# Patient Record
Sex: Female | Born: 1945 | Race: White | Hispanic: No | Smoking: Never smoker
Health system: Southern US, Community
[De-identification: ages and names within clinical notes are randomized; demographics above are authoritative.]

## PROBLEM LIST (undated history)

## (undated) DIAGNOSIS — M069 Rheumatoid arthritis, unspecified: Secondary | ICD-10-CM

## (undated) DIAGNOSIS — E785 Hyperlipidemia, unspecified: Secondary | ICD-10-CM

## (undated) DIAGNOSIS — F419 Anxiety disorder, unspecified: Secondary | ICD-10-CM

## (undated) DIAGNOSIS — R0789 Other chest pain: Secondary | ICD-10-CM

## (undated) DIAGNOSIS — I10 Essential (primary) hypertension: Secondary | ICD-10-CM

## (undated) DIAGNOSIS — I071 Rheumatic tricuspid insufficiency: Secondary | ICD-10-CM

## (undated) DIAGNOSIS — I4891 Unspecified atrial fibrillation: Secondary | ICD-10-CM

## (undated) DIAGNOSIS — I482 Chronic atrial fibrillation, unspecified: Secondary | ICD-10-CM

## (undated) DIAGNOSIS — I5189 Other ill-defined heart diseases: Secondary | ICD-10-CM

## (undated) DIAGNOSIS — I350 Nonrheumatic aortic (valve) stenosis: Secondary | ICD-10-CM

## (undated) HISTORY — PX: PARTIAL HYSTERECTOMY: SHX80

## (undated) HISTORY — PX: ABDOMINAL HYSTERECTOMY: SHX81

## (undated) HISTORY — DX: Unspecified atrial fibrillation: I48.91

## (undated) HISTORY — DX: Rheumatic tricuspid insufficiency: I07.1

## (undated) HISTORY — DX: Other chest pain: R07.89

## (undated) HISTORY — PX: CHOLECYSTECTOMY: SHX55

## (undated) HISTORY — PX: CARDIAC CATHETERIZATION: SHX172

## (undated) HISTORY — PX: EYE SURGERY: SHX253

## (undated) HISTORY — PX: TOTAL KNEE ARTHROPLASTY: SHX125

## (undated) HISTORY — DX: Chronic atrial fibrillation, unspecified: I48.20

## (undated) HISTORY — DX: Hyperlipidemia, unspecified: E78.5

## (undated) HISTORY — PX: BUNIONECTOMY: SHX129

## (undated) HISTORY — DX: Essential (primary) hypertension: I10

## (undated) HISTORY — DX: Other ill-defined heart diseases: I51.89

## (undated) HISTORY — PX: JOINT REPLACEMENT: SHX530

## (undated) HISTORY — DX: Anxiety disorder, unspecified: F41.9

## (undated) HISTORY — DX: Nonrheumatic aortic (valve) stenosis: I35.0

## (undated) HISTORY — PX: CATARACT EXTRACTION: SUR2

---

## 1998-05-08 ENCOUNTER — Ambulatory Visit (HOSPITAL_COMMUNITY): Admission: RE | Admit: 1998-05-08 | Discharge: 1998-05-08 | Payer: Self-pay | Admitting: Internal Medicine

## 1999-05-28 ENCOUNTER — Encounter: Admission: RE | Admit: 1999-05-28 | Discharge: 1999-05-28 | Payer: Self-pay | Admitting: Internal Medicine

## 2000-05-28 ENCOUNTER — Encounter: Admission: RE | Admit: 2000-05-28 | Discharge: 2000-05-28 | Payer: Self-pay | Admitting: Internal Medicine

## 2000-05-28 ENCOUNTER — Encounter: Payer: Self-pay | Admitting: Internal Medicine

## 2001-04-23 ENCOUNTER — Encounter: Payer: Self-pay | Admitting: Internal Medicine

## 2001-04-23 ENCOUNTER — Encounter: Admission: RE | Admit: 2001-04-23 | Discharge: 2001-04-23 | Payer: Self-pay | Admitting: Internal Medicine

## 2001-06-02 ENCOUNTER — Encounter: Payer: Self-pay | Admitting: Internal Medicine

## 2001-06-02 ENCOUNTER — Encounter: Admission: RE | Admit: 2001-06-02 | Discharge: 2001-06-02 | Payer: Self-pay | Admitting: Internal Medicine

## 2002-06-03 ENCOUNTER — Encounter: Admission: RE | Admit: 2002-06-03 | Discharge: 2002-06-03 | Payer: Self-pay | Admitting: Internal Medicine

## 2002-06-03 ENCOUNTER — Encounter: Payer: Self-pay | Admitting: Internal Medicine

## 2003-01-12 ENCOUNTER — Encounter: Payer: Self-pay | Admitting: Internal Medicine

## 2003-01-12 ENCOUNTER — Encounter: Admission: RE | Admit: 2003-01-12 | Discharge: 2003-01-12 | Payer: Self-pay | Admitting: Internal Medicine

## 2003-06-01 ENCOUNTER — Ambulatory Visit (HOSPITAL_COMMUNITY): Admission: RE | Admit: 2003-06-01 | Discharge: 2003-06-01 | Payer: Self-pay | Admitting: *Deleted

## 2003-06-06 ENCOUNTER — Encounter: Payer: Self-pay | Admitting: Internal Medicine

## 2003-06-06 ENCOUNTER — Encounter: Admission: RE | Admit: 2003-06-06 | Discharge: 2003-06-06 | Payer: Self-pay | Admitting: Internal Medicine

## 2004-06-07 ENCOUNTER — Ambulatory Visit (HOSPITAL_COMMUNITY): Admission: RE | Admit: 2004-06-07 | Discharge: 2004-06-07 | Payer: Self-pay | Admitting: Internal Medicine

## 2004-11-21 ENCOUNTER — Encounter: Admission: RE | Admit: 2004-11-21 | Discharge: 2004-11-21 | Payer: Self-pay | Admitting: Internal Medicine

## 2005-06-10 ENCOUNTER — Ambulatory Visit (HOSPITAL_COMMUNITY): Admission: RE | Admit: 2005-06-10 | Discharge: 2005-06-10 | Payer: Self-pay | Admitting: Internal Medicine

## 2006-03-07 ENCOUNTER — Encounter: Admission: RE | Admit: 2006-03-07 | Discharge: 2006-03-07 | Payer: Self-pay | Admitting: Internal Medicine

## 2006-06-16 ENCOUNTER — Ambulatory Visit (HOSPITAL_COMMUNITY): Admission: RE | Admit: 2006-06-16 | Discharge: 2006-06-16 | Payer: Self-pay | Admitting: Internal Medicine

## 2007-06-19 ENCOUNTER — Encounter: Admission: RE | Admit: 2007-06-19 | Discharge: 2007-06-19 | Payer: Self-pay | Admitting: Internal Medicine

## 2008-04-05 ENCOUNTER — Inpatient Hospital Stay (HOSPITAL_COMMUNITY): Admission: RE | Admit: 2008-04-05 | Discharge: 2008-04-09 | Payer: Self-pay | Admitting: Orthopedic Surgery

## 2008-06-20 ENCOUNTER — Ambulatory Visit (HOSPITAL_COMMUNITY): Admission: RE | Admit: 2008-06-20 | Discharge: 2008-06-20 | Payer: Self-pay | Admitting: Internal Medicine

## 2009-04-26 ENCOUNTER — Inpatient Hospital Stay (HOSPITAL_BASED_OUTPATIENT_CLINIC_OR_DEPARTMENT_OTHER): Admission: RE | Admit: 2009-04-26 | Discharge: 2009-04-26 | Payer: Self-pay | Admitting: Cardiovascular Disease

## 2009-06-21 ENCOUNTER — Ambulatory Visit (HOSPITAL_COMMUNITY): Admission: RE | Admit: 2009-06-21 | Discharge: 2009-06-21 | Payer: Self-pay | Admitting: Internal Medicine

## 2010-03-02 ENCOUNTER — Inpatient Hospital Stay (HOSPITAL_COMMUNITY): Admission: RE | Admit: 2010-03-02 | Discharge: 2010-03-05 | Payer: Self-pay | Admitting: Orthopedic Surgery

## 2010-03-26 ENCOUNTER — Ambulatory Visit: Payer: Self-pay | Admitting: Cardiovascular Disease

## 2010-04-23 ENCOUNTER — Ambulatory Visit: Payer: Self-pay | Admitting: Cardiovascular Disease

## 2010-05-23 ENCOUNTER — Ambulatory Visit: Payer: Self-pay | Admitting: Cardiovascular Disease

## 2010-06-20 ENCOUNTER — Ambulatory Visit: Payer: Self-pay | Admitting: Cardiology

## 2010-06-22 ENCOUNTER — Ambulatory Visit (HOSPITAL_COMMUNITY): Admission: RE | Admit: 2010-06-22 | Discharge: 2010-06-22 | Payer: Self-pay | Admitting: Internal Medicine

## 2010-07-19 ENCOUNTER — Ambulatory Visit: Payer: Self-pay | Admitting: Cardiology

## 2010-08-17 ENCOUNTER — Ambulatory Visit: Payer: Self-pay | Admitting: Cardiovascular Disease

## 2010-08-27 ENCOUNTER — Ambulatory Visit: Payer: Self-pay | Admitting: Cardiology

## 2010-09-10 ENCOUNTER — Ambulatory Visit: Payer: Self-pay | Admitting: Cardiovascular Disease

## 2010-09-12 ENCOUNTER — Other Ambulatory Visit (INDEPENDENT_AMBULATORY_CARE_PROVIDER_SITE_OTHER): Payer: BC Managed Care – PPO

## 2010-09-12 DIAGNOSIS — Z7901 Long term (current) use of anticoagulants: Secondary | ICD-10-CM

## 2010-09-15 ENCOUNTER — Encounter: Payer: Self-pay | Admitting: Cardiovascular Disease

## 2010-09-15 DIAGNOSIS — I4891 Unspecified atrial fibrillation: Secondary | ICD-10-CM | POA: Insufficient documentation

## 2010-09-15 DIAGNOSIS — R0789 Other chest pain: Secondary | ICD-10-CM | POA: Insufficient documentation

## 2010-09-15 DIAGNOSIS — I071 Rheumatic tricuspid insufficiency: Secondary | ICD-10-CM | POA: Insufficient documentation

## 2010-09-15 DIAGNOSIS — I350 Nonrheumatic aortic (valve) stenosis: Secondary | ICD-10-CM | POA: Insufficient documentation

## 2010-09-15 DIAGNOSIS — I5189 Other ill-defined heart diseases: Secondary | ICD-10-CM | POA: Insufficient documentation

## 2010-09-15 DIAGNOSIS — F411 Generalized anxiety disorder: Secondary | ICD-10-CM | POA: Insufficient documentation

## 2010-09-15 DIAGNOSIS — I10 Essential (primary) hypertension: Secondary | ICD-10-CM | POA: Insufficient documentation

## 2010-09-15 DIAGNOSIS — E785 Hyperlipidemia, unspecified: Secondary | ICD-10-CM | POA: Insufficient documentation

## 2010-09-26 ENCOUNTER — Other Ambulatory Visit (INDEPENDENT_AMBULATORY_CARE_PROVIDER_SITE_OTHER): Payer: BC Managed Care – PPO

## 2010-09-26 DIAGNOSIS — I4891 Unspecified atrial fibrillation: Secondary | ICD-10-CM

## 2010-09-26 DIAGNOSIS — Z7901 Long term (current) use of anticoagulants: Secondary | ICD-10-CM

## 2010-10-10 ENCOUNTER — Encounter (INDEPENDENT_AMBULATORY_CARE_PROVIDER_SITE_OTHER): Payer: BC Managed Care – PPO

## 2010-10-10 DIAGNOSIS — I4891 Unspecified atrial fibrillation: Secondary | ICD-10-CM

## 2010-10-10 DIAGNOSIS — Z7901 Long term (current) use of anticoagulants: Secondary | ICD-10-CM

## 2010-10-27 LAB — BASIC METABOLIC PANEL
BUN: 10 mg/dL (ref 6–23)
BUN: 11 mg/dL (ref 6–23)
Calcium: 8.9 mg/dL (ref 8.4–10.5)
Creatinine, Ser: 0.76 mg/dL (ref 0.4–1.2)
GFR calc non Af Amer: >60 mL/min (ref >60)
GFR calc non Af Amer: >60 mL/min (ref >60)
Glucose, Bld: 131 mg/dL — ABNORMAL HIGH (ref 70–99)
Sodium: 138 mEq/L (ref 135–145)

## 2010-10-27 LAB — COMPREHENSIVE METABOLIC PANEL
ALT: 19 U/L (ref 0–35)
AST: 22 U/L (ref 0–37)
Albumin: 3.9 g/dL (ref 3.5–5.2)
Alkaline Phosphatase: 62 U/L (ref 39–117)
BUN: 15 mg/dL (ref 6–23)
Chloride: 103 mEq/L (ref 96–112)
Potassium: 4.2 mEq/L (ref 3.5–5.1)
Total Bilirubin: 0.5 mg/dL (ref 0.3–1.2)

## 2010-10-27 LAB — DIFFERENTIAL
Basophils Absolute: 0.1 10*3/uL (ref 0.0–0.1)
Basophils Relative: 1 % (ref 0–1)
Eosinophils Absolute: 0.2 10*3/uL (ref 0.0–0.7)
Eosinophils Relative: 2 % (ref 0–5)
Monocytes Absolute: 0.6 10*3/uL (ref 0.1–1.0)
Neutro Abs: 3.8 10*3/uL (ref 1.7–7.7)

## 2010-10-27 LAB — CBC
HCT: 43.6 % (ref 36.0–46.0)
Hemoglobin: 12.6 g/dL (ref 12.0–15.0)
MCHC: 33.7 g/dL (ref 30.0–36.0)
MCV: 94 fL (ref 78.0–100.0)
Platelets: 162 10*3/uL (ref 150–400)
Platelets: 169 10*3/uL (ref 150–400)
Platelets: 224 10*3/uL (ref 150–400)
RBC: 4.64 MIL/uL (ref 3.87–5.11)
RDW: 13.7 % (ref 11.5–15.5)
RDW: 13.7 % (ref 11.5–15.5)
RDW: 13.8 % (ref 11.5–15.5)
WBC: 8.1 10*3/uL (ref 4.0–10.5)
WBC: 9.8 10*3/uL (ref 4.0–10.5)
WBC: 9.8 10*3/uL (ref 4.0–10.5)
WBC: 9.9 10*3/uL (ref 4.0–10.5)

## 2010-10-27 LAB — PROTIME-INR
INR: 1.05 (ref 0.00–1.49)
INR: 1.14 (ref 0.00–1.49)
INR: 1.17 (ref 0.00–1.49)
INR: 1.21 (ref 0.00–1.49)
INR: 2.15 — ABNORMAL HIGH (ref 0.00–1.49)
Prothrombin Time: 13.6 seconds (ref 11.6–15.2)
Prothrombin Time: 14.8 seconds (ref 11.6–15.2)
Prothrombin Time: 15.2 seconds (ref 11.6–15.2)

## 2010-10-27 LAB — URINE MICROSCOPIC-ADD ON

## 2010-10-27 LAB — ABO/RH: ABO/RH(D): A NEG

## 2010-10-27 LAB — SURGICAL PCR SCREEN: MRSA, PCR: NEGATIVE

## 2010-10-27 LAB — URINALYSIS, ROUTINE W REFLEX MICROSCOPIC
Bilirubin Urine: NEGATIVE
Glucose, UA: NEGATIVE mg/dL
Ketones, ur: NEGATIVE mg/dL
pH: 6.5 (ref 5.0–8.0)

## 2010-10-27 LAB — TYPE AND SCREEN: Antibody Screen: NEGATIVE

## 2010-11-05 ENCOUNTER — Other Ambulatory Visit: Payer: Self-pay | Admitting: *Deleted

## 2010-11-05 DIAGNOSIS — I4891 Unspecified atrial fibrillation: Secondary | ICD-10-CM

## 2010-11-05 MED ORDER — CARVEDILOL 25 MG PO TABS: 25.0000 mg | ORAL_TABLET | Freq: Two times a day (BID) | ORAL | Status: DC

## 2010-11-05 MED ORDER — WARFARIN SODIUM 5 MG PO TABS: 5.0000 mg | ORAL_TABLET | Freq: Every day | ORAL | Status: DC

## 2010-11-05 MED ORDER — WARFARIN SODIUM 5 MG PO TABS: 5.0000 mg | ORAL_TABLET | ORAL | Status: DC

## 2010-11-05 NOTE — Telephone Encounter (Signed)
Pt requesting refill on  Coreg [facility]madin [facility] [place]

## 2010-11-06 ENCOUNTER — Ambulatory Visit (INDEPENDENT_AMBULATORY_CARE_PROVIDER_SITE_OTHER): Payer: BC Managed Care – PPO | Admitting: *Deleted

## 2010-11-06 ENCOUNTER — Ambulatory Visit: Payer: Self-pay | Admitting: Cardiovascular Disease

## 2010-11-06 DIAGNOSIS — I4891 Unspecified atrial fibrillation: Secondary | ICD-10-CM

## 2010-11-06 DIAGNOSIS — Z7901 Long term (current) use of anticoagulants: Secondary | ICD-10-CM

## 2010-11-06 LAB — POCT INR: INR: 3.2

## 2010-11-23 ENCOUNTER — Ambulatory Visit (INDEPENDENT_AMBULATORY_CARE_PROVIDER_SITE_OTHER): Payer: BC Managed Care – PPO | Admitting: Cardiovascular Disease

## 2010-11-23 ENCOUNTER — Encounter: Payer: Self-pay | Admitting: Cardiovascular Disease

## 2010-11-23 ENCOUNTER — Ambulatory Visit (INDEPENDENT_AMBULATORY_CARE_PROVIDER_SITE_OTHER): Payer: BC Managed Care – PPO | Admitting: *Deleted

## 2010-11-23 VITALS — BP 138/98 | HR 74 | Ht 64.0 in | Wt 253.4 lb

## 2010-11-23 DIAGNOSIS — I4891 Unspecified atrial fibrillation: Secondary | ICD-10-CM

## 2010-11-23 DIAGNOSIS — I10 Essential (primary) hypertension: Secondary | ICD-10-CM

## 2010-11-23 MED ORDER — POTASSIUM CHLORIDE ER 10 MEQ PO TBCR: 10.0000 meq | EXTENDED_RELEASE_TABLET | Freq: Two times a day (BID) | ORAL | Status: DC

## 2010-11-23 MED ORDER — HYDROCHLOROTHIAZIDE 25 MG PO TABS: 25.0000 mg | ORAL_TABLET | Freq: Every day | ORAL | Status: DC

## 2010-11-23 NOTE — Progress Notes (Signed)
Tammy Robbins Date of Birth  May 13, 1946 [facility] [address]     [address] [place], [place]  [address] [phone]  Fax  [phone]  History of Present Illness:  Tammy Robbins is an elderly female with a history of chest tightness. She has smooth and normal coronary arteries by heart catheterization. She has a history of atrial ablation. She's been fairly well controlled and has not wanted to have cardioversion. She's been able to do all of her normal activities without any significant problems.  Current Outpatient Prescriptions on File Prior to Visit  Medication Sig Dispense Refill  . carvedilol (COREG) 25 MG tablet Take 1 tablet (25 mg total) by mouth 2 (two) times daily.  180 tablet  3  . Clidinium-Chlordiazepoxide (LIBRAX PO) Take by mouth as needed.       . digoxin (LANOXIN) 0.25 MG tablet Take 250 mcg by mouth daily.        . fish oil-omega-3 fatty acids 1000 MG capsule Take by mouth daily.        . fluconazole (DIFLUCAN) 150 MG tablet Take 150 mg by mouth once.        . Lansoprazole (PREVACID PO) Take by mouth as needed.        . Multiple Vitamin (MULTIVITAMIN) tablet Take 1 tablet by mouth daily.        . nitroGLYCERIN (NITROSTAT) 0.4 MG SL tablet Place 0.4 mg under the tongue every 5 (five) minutes as needed.        Marland Kitchen omeprazole (PRILOSEC) 20 MG capsule Take 20 mg by mouth daily.        . simvastatin (ZOCOR) 20 MG tablet Take 20 mg by mouth at bedtime.        Marland Kitchen warfarin (COUMADIN) 5 MG tablet Take 1 tablet (5 mg total) by mouth daily.  30 tablet  11  . oxyCODONE-acetaminophen (PERCOCET) 5-325 MG per tablet Take 1 tablet by mouth daily.        Marland Kitchen DISCONTD: warfarin (COUMADIN) 5 MG tablet Take 1 tablet (5 mg total) by mouth as directed.  90 tablet  3    No Known Allergies  Past Medical History  Diagnosis Date  . Chest tightness   . HTN (hypertension)   . Hyperlipemia   . Atrial fibrillation   . Mild aortic stenosis   . Diastolic  dysfunction   . Mild tricuspid regurgitation   . Anxiety     Past Surgical History  Procedure Date  . Cardiac catheterization     NORMAL  . Cataract extraction   . Total knee arthroplasty     LEFT  . Partial hysterectomy   . Bunionectomy   . Cholecystectomy     History  Smoking status  . Never Smoker   Smokeless tobacco  . Not on file    History  Alcohol Use  . Yes    occassionally    Family History  Problem Relation Age of Onset  . Intracerebral hemorrhage Father   . Hypertension Father   . Pneumonia Mother   . Lung cancer Brother   . Mental retardation Sister     Reviw of Systems:  Reviewed in the HPI.  All other systems are negative.  Physical Exam: BP 138/98  Pulse 74  Ht 5\' 4"  (1.626 m)  Wt 253 lb 6.4 oz (114.941 kg)  BMI 43.50 kg/m2 The patient is alert and oriented x 3.  The mood and affect are normal.  The  skin is warm and dry.  Color is normal.  The HEENT exam reveals that the sclera are nonicteric.  The mucous membranes are moist.  The carotids are 2+ without bruits.  There is no thyromegaly.  There is no JVD.  The lungs are clear.  The chest wall is non tender.  The heart exam reveals an  irregular rate with a normal S1 and S2.  There are no murmurs, gallops, or rubs.  The PMI is not displaced.   Abdominal exam reveals good bowel sounds.  There is no guarding or rebound.  There is no hepatosplenomegaly or tenderness.  There are no masses.  Exam of the legs reveal no clubbing, cyanosis, or edema.  The legs are without rashes.  The distal pulses are intact.  Cranial nerves II - XII are intact.  Motor and sensory functions are intact.  The gait is normal.  Assessment / Plan:

## 2010-11-23 NOTE — Assessment & Plan Note (Signed)
Her diastolic blood pressure is elevated today. We had started her on HCTZ 25 mg a day and potassium chloride 10 mEq a day. I'll see her again in 2 months for office visit, and basic metabolic profile.

## 2010-11-23 NOTE — Assessment & Plan Note (Signed)
Her atrial fibrillation is well controlled. We talked about cardioversion. She's not having any symptoms and would like to continue with her same medications. She will consider cardioversion at a later time if needed.

## 2010-12-21 ENCOUNTER — Ambulatory Visit (INDEPENDENT_AMBULATORY_CARE_PROVIDER_SITE_OTHER): Payer: BC Managed Care – PPO | Admitting: *Deleted

## 2010-12-21 DIAGNOSIS — Z7901 Long term (current) use of anticoagulants: Secondary | ICD-10-CM

## 2010-12-21 DIAGNOSIS — I4891 Unspecified atrial fibrillation: Secondary | ICD-10-CM

## 2010-12-25 NOTE — Discharge Summary (Signed)
NAMETERECIA, Tammy Robbins                  ACCOUNT NO.:  [id number]   MEDICAL RECORD NO.:  [id number]          PATIENT TYPE:  INP   LOCATION:  1525                         FACILITY:  [facility]   PHYSICIAN:  Deidre Ala, M.D.    DATE OF BIRTH:  [birth year]   DATE OF ADMISSION:  04/05/2008  DATE OF DISCHARGE:  04/09/2008                               DISCHARGE SUMMARY   FINAL DIAGNOSES:  1. End-stage degenerative joint disease left knee.  2. Hypertension.  3. Postoperative blood loss anemia.  4. Hyperkalemia, resolved.  5. Tachycardia, resolved.   PROCEDURES:  April 05, 2008 total left knee arthroplasty.  Surgeon:  Dr. Renae Fickle.   HISTORY:  This is a 65 year old Caucasian female who is followed by Dr.  Renae Fickle of her DJD for left knee.  She has subsequently failed medical  management.  Was waiting for a total knee arthroplasty.  She is  subsequently scheduled for this.   HOSPITAL COURSE:  The patient was admitted to [facility] on  April 05, 2008.  At that time she underwent a total left knee  arthroplasty using a DePuy LCS system with MBP stem and rotating  platform cemented.  The patient tolerated the procedure well, no  intraoperative complications.  Postoperatively the patient had no  untoward events occur while she was here.  She required no blood  products.  She did well with physical therapy.  Was up and getting out  of bed.  At the time of discharge she was able to get out of bed by  herself without assistance.  At this time she is ready to be discharged.  Her incision was healing well at time of discharge.  No sign of  infection.  Range of motion was 0 to about 60 degrees.  At that time  with the CPM machine she was getting up to about 80 degrees.  She will  go home continuing on a CPM machine.  She will be followed by physical  therapy and home health while she is out.  She is on Lovenox.  She will  continue on this to take 30 mg subcu daily for the next 10 days.  She is  given Percocet 5/325 one or two p.o. q.6-8 h p.r.n. for pain, 50 of  these, no refills.  She is given Robaxin 800 mg to take 1 p.o. q.i.d.  p.r.n., 40 of these, no refills.  Will see her back in our office in 10  days for removal of her staples.  The patient is subsequently discharged  on April 09, 2008 in satisfactory and stable condition.      Phineas Semen, P.A.    ______________________________  Seth Bake. Charlesetta Shanks, M.D.    CL/MEDQ  D:  04/09/2008  T:  04/09/2008  Job:  [id number]

## 2010-12-25 NOTE — Op Note (Signed)
NAMETRINE, FREAD NO.:  [id number]   MEDICAL RECORD NO.:  [id number]          PATIENT TYPE:  INP   LOCATION:  0008                         FACILITY:  [facility]   PHYSICIAN:  Deidre Ala, M.D.    DATE OF BIRTH:  04-14-1946   DATE OF PROCEDURE:  04/05/2008  DATE OF DISCHARGE:                               OPERATIVE REPORT   PREOPERATIVE DIAGNOSES:  1. End-stage degenerative joint disease, left knee.  2. High body mass index.   POSTOPERATIVE DIAGNOSES:  1. End-stage degenerative joint disease, left knee.  2. High body mass index.   PROCEDURE:  Left total knee arthroplasty using cemented DePuy  components, LCS 5 with rotating platform and MBT tibial stems.   SURGEON:  Jearld Adjutant, M.D.   ASSISTANT:  Phineas Semen, P.A.   ANESTHESIA:  Spinal.   CULTURES:  None.   DRAINS:  Two medium Hemovacs to self suction.   ESTIMATED BLOOD LOSS:  100 mL replaced __________ .   TOURNIQUET TIME:  73 minutes.   PATHOLOGIC FINDINGS AND HISTORY:  Tammy Robbins has had long-standing knee DJD,  left greater than right with varus, and reached the point the pain was  intolerable.  At surgery she had significant tricompartment disease.  We  ended up implanting a standard left femur with a #4 revision cemented  tray with a 75 by 14-mm stem and a 12.5 rotating platform.  Cement was  infused with tobramycin.  We had full extension, flexion to about 100  degrees with stability throughout.  We did a slight medial release.  Patella was oval, dome patella 3 peg, 32 mm.   PROCEDURE:  With adequate anesthesia obtained using spinal technique, 1  g Ancef given IV prophylaxis and another one at tourniquet letdown, the  patient was placed in the supine position.  The left lower extremity was  prepped from the toes to the tourniquet in a standard fashion.  After  standard prepping and draping, Esmarch examination was used, the  tourniquet was let up to 400 mmHg.  Median parapatellar skin  incision  was followed by a median parapatellar retinacular incision.  Incision  was deepened sharply with the knife and hemostasis obtained using the  Bovie electrocoagulator.  The patella was then everted.  Fat pad was  excised as well as both menisci and the cruciates.  Thorough synovectomy  was carried out especially in the pouch and the osteophytes were  removed.  I then amputated the tibial spine and placed the  intramedullary drill and reamed up to a 14.  The tibial cutting jig was  put in place, set on 6, and the cut made in slight varus to effect leg  valgus.  The cut was made and freshened.  We then sized the femur to a  standard, placed the intramedullary guide and then set the C clamp on 15  after doing a medial release, pinned it, and made the anterior-posterior  cuts.  We then placed the 12.5 lollipop for good fit and fill within the  flexion gap.  We then placed a 4-degree  valgus distal femoral cutting  jig in place, made the first cut 2.5 back, it was still tight, put it  2.5 more back, and fit the 12.5 in full extension.  The finishing guide  was then placed on the distal femur for the anterior-posterior and  chamfer cuts as well as the notch cut.  We then sized the tibia to a #4,  placed a central conical reamer, made that ream, and then reamed with  the universal stem flute trial.  We then placed the #4 MBT revision tray  with a 75 x 14 stem and impacted it.  We then placed the standard femur  on and impacted it and articulated the knee through a full range of  motion.  I then set a caliper on the patella to a 22, used the box  reamer and reamed it down to a 15, replacing 8, and used the 3-hole  template to cut the holes for the patella, pushed the patella in place  and articulated the knee through a range of motion.  All trial  components were then removed while the knee was thoroughly jet lavaged.  We then checked the components for sizing as they came on the field  and  mixed the cement with a cement gun with the tobramycin, 2 batches.  We  then cemented on the tibial component with cement down to and just above  the flutes, impacted it, removed excess cement.  Please note that we had  placed the keel punch earlier.  We then placed the rotating platform.  We then cemented on the femoral component, impacted it, and removed  excess cement.  We then held the knee in full extension, removed excess  cement.  We then cemented on the patella component, impacted it, removed  excess cement, and held it until the cement had cured.  Thorough jet  lavage was then carried out on the knee.  When the cement had cured, we  then let the tourniquet down.  Bleeding points were cauterized and the  knee then had Hemovac drains placed in the medial and lateral gutter and  brought out the superior lateral portal.  The wound was then closed in  layers with #1 interrupted figure-of-eight Vicryl on the retinaculum,  with a running locking oversew of #1 PDS, 0, 2-0 and 3-0 Vicryl were  used on the subcu and skin staples.  We then charged the Hemovac, one  limb with 20 mL of saline with 40 mg of gentamicin and clamped with a  double rubber shod clamp both limbs of the Hemovac.  These will be let  open about 2 hours postop.  Bulky sterile compressive dressing was  applied.  The patient then with a knee immobilizer.  The patient having  tolerated the procedure well was awakened, taken to the recovery room  recovery room in satisfactory condition for routine postoperative care  and CPM.           ______________________________  V. Charlesetta Shanks, M.D.     VEP/MEDQ  D:  04/05/2008  T:  04/06/2008  Job:  [id number]

## 2011-01-09 ENCOUNTER — Telehealth: Payer: Self-pay | Admitting: Cardiovascular Disease

## 2011-01-09 ENCOUNTER — Other Ambulatory Visit: Payer: Self-pay | Admitting: *Deleted

## 2011-01-09 DIAGNOSIS — I10 Essential (primary) hypertension: Secondary | ICD-10-CM

## 2011-01-09 MED ORDER — POTASSIUM CHLORIDE ER 10 MEQ PO TBCR: 10.0000 meq | EXTENDED_RELEASE_TABLET | Freq: Two times a day (BID) | ORAL | Status: DC

## 2011-01-09 MED ORDER — HYDROCHLOROTHIAZIDE 25 MG PO TABS: 25.0000 mg | ORAL_TABLET | Freq: Every day | ORAL | Status: DC

## 2011-01-09 NOTE — Telephone Encounter (Signed)
Pt to call pcp, she is waiting to be seen by urologist but no avail appointment soon. Told her to just follow up with lab test/inr with in one week if she goes on an antibiotic.Pt verbalized understanding. Alfonso Ramus RN

## 2011-01-09 NOTE — Telephone Encounter (Signed)
Pt wants to know what she can take for uti since she is taking coumadin please call

## 2011-01-09 NOTE — Telephone Encounter (Signed)
Request 90 day supply/ completed.

## 2011-01-11 ENCOUNTER — Other Ambulatory Visit: Payer: Self-pay | Admitting: *Deleted

## 2011-01-18 ENCOUNTER — Ambulatory Visit (INDEPENDENT_AMBULATORY_CARE_PROVIDER_SITE_OTHER): Payer: BC Managed Care – PPO | Admitting: *Deleted

## 2011-01-18 DIAGNOSIS — Z7901 Long term (current) use of anticoagulants: Secondary | ICD-10-CM

## 2011-01-18 DIAGNOSIS — I4891 Unspecified atrial fibrillation: Secondary | ICD-10-CM

## 2011-01-25 ENCOUNTER — Other Ambulatory Visit: Payer: Self-pay | Admitting: *Deleted

## 2011-01-25 DIAGNOSIS — Z79899 Other long term (current) drug therapy: Secondary | ICD-10-CM

## 2011-01-28 ENCOUNTER — Encounter: Payer: Self-pay | Admitting: Cardiovascular Disease

## 2011-01-28 ENCOUNTER — Other Ambulatory Visit (INDEPENDENT_AMBULATORY_CARE_PROVIDER_SITE_OTHER): Payer: BC Managed Care – PPO | Admitting: *Deleted

## 2011-01-28 ENCOUNTER — Ambulatory Visit (INDEPENDENT_AMBULATORY_CARE_PROVIDER_SITE_OTHER): Payer: BC Managed Care – PPO | Admitting: *Deleted

## 2011-01-28 ENCOUNTER — Ambulatory Visit (INDEPENDENT_AMBULATORY_CARE_PROVIDER_SITE_OTHER): Payer: BC Managed Care – PPO | Admitting: Cardiovascular Disease

## 2011-01-28 DIAGNOSIS — Z79899 Other long term (current) drug therapy: Secondary | ICD-10-CM

## 2011-01-28 DIAGNOSIS — Z7901 Long term (current) use of anticoagulants: Secondary | ICD-10-CM

## 2011-01-28 DIAGNOSIS — I4891 Unspecified atrial fibrillation: Secondary | ICD-10-CM

## 2011-01-28 LAB — BASIC METABOLIC PANEL
BUN: 17 mg/dL (ref 6–23)
CO2: 28 mEq/L (ref 19–32)
Chloride: 101 mEq/L (ref 96–112)
Creatinine, Ser: 0.9 mg/dL (ref 0.4–1.2)

## 2011-01-28 NOTE — Assessment & Plan Note (Signed)
Pt is doing well.  Is completely asymptomatic from an A-Fib standpoint.  Will continue same meds.  Coumadin is theraputic ( 2.9).  INR in 2 weeks.

## 2011-01-28 NOTE — Progress Notes (Signed)
[name] Date of Birth  1946-02-12 [facility] [address]     [address] [place], [place]  [address] [phone]  Fax  [phone]  History of Present Illness:  Pt feels great.  No recent episodes of dyspnea.  No chest pain.  Exercising several times a week.  Walks regularly.  Current Outpatient Prescriptions on File Prior to Visit  Medication Sig Dispense Refill  . carvedilol (COREG) 25 MG tablet Take 1 tablet (25 mg total) by mouth 2 (two) times daily.  180 tablet  3  . Clidinium-Chlordiazepoxide (LIBRAX PO) Take by mouth as needed.       . digoxin (LANOXIN) 0.25 MG tablet Take 250 mcg by mouth daily.        . fish oil-omega-3 fatty acids 1000 MG capsule Take by mouth daily.        . fluconazole (DIFLUCAN) 150 MG tablet Take 150 mg by mouth once.        . hydrochlorothiazide 25 MG tablet Take 1 tablet (25 mg total) by mouth daily.  90 tablet  3  . Multiple Vitamin (MULTIVITAMIN) tablet Take 1 tablet by mouth daily.        . nitroGLYCERIN (NITROSTAT) 0.4 MG SL tablet Place 0.4 mg under the tongue every 5 (five) minutes as needed.        Marland Kitchen omeprazole (PRILOSEC) 20 MG capsule Take 20 mg by mouth daily.        . potassium chloride (K-DUR) 10 MEQ tablet Take 1 tablet (10 mEq total) by mouth 2 (two) times daily.  180 tablet  3  . simvastatin (ZOCOR) 20 MG tablet Take 20 mg by mouth at bedtime.        Marland Kitchen warfarin (COUMADIN) 5 MG tablet Take 1 tablet (5 mg total) by mouth daily.  30 tablet  11  . DISCONTD: Lansoprazole (PREVACID PO) Take by mouth as needed.        Marland Kitchen DISCONTD: oxyCODONE-acetaminophen (PERCOCET) 5-325 MG per tablet Take 1 tablet by mouth daily.          No Known Allergies  Past Medical History  Diagnosis Date  . Chest tightness   . HTN (hypertension)   . Hyperlipemia   . Atrial fibrillation   . Mild aortic stenosis   . Diastolic dysfunction   . Mild tricuspid regurgitation   . Anxiety     Past Surgical History    Procedure Date  . Cardiac catheterization     NORMAL  . Cataract extraction   . Total knee arthroplasty     LEFT  . Partial hysterectomy   . Bunionectomy   . Cholecystectomy     History  Smoking status  . Never Smoker   Smokeless tobacco  . Not on file    History  Alcohol Use  . Yes    occassionally    Family History  Problem Relation Age of Onset  . Intracerebral hemorrhage Father   . Hypertension Father   . Pneumonia Mother   . Lung cancer Brother   . Mental retardation Sister     Reviw of Systems:  Reviewed in the HPI.  All other systems are negative.  Physical Exam: BP 124/72  Pulse 74  Ht 5\' 5"  (1.651 m)  Wt 255 lb (115.667 kg)  BMI 42.43 kg/m2 The patient is alert and oriented x 3.  The mood and affect are normal.  The skin is warm and dry.  Color is normal.  The HEENT exam reveals that the sclera are nonicteric.  The mucous membranes are moist.  The carotids are 2+ without bruits.  There is no thyromegaly.  There is no JVD.  The lungs are clear.  The chest wall is non tender.  The heart exam reveals an irregular rate with a normal S1 and S2.  There are no murmurs, gallops, or rubs.  The PMI is not displaced.   Abdominal exam reveals good bowel sounds.  There is no guarding or rebound.  There is no hepatosplenomegaly or tenderness.  There are no masses.  Exam of the legs reveals mild edema.  The legs are without rashes.  The distal pulses are intact.  Cranial nerves II - XII are intact.  Motor and sensory functions are intact.  The gait is normal.  ECG:  Assessment / Plan:

## 2011-01-30 NOTE — Progress Notes (Signed)
Pt informed to f/u with pcp and where forwarded to pcp

## 2011-02-04 ENCOUNTER — Encounter: Payer: Self-pay | Admitting: Cardiovascular Disease

## 2011-02-14 ENCOUNTER — Ambulatory Visit (INDEPENDENT_AMBULATORY_CARE_PROVIDER_SITE_OTHER): Payer: BC Managed Care – PPO | Admitting: *Deleted

## 2011-02-14 DIAGNOSIS — I4891 Unspecified atrial fibrillation: Secondary | ICD-10-CM

## 2011-02-14 DIAGNOSIS — Z7901 Long term (current) use of anticoagulants: Secondary | ICD-10-CM

## 2011-02-14 LAB — POCT INR: INR: 2.8

## 2011-03-01 ENCOUNTER — Ambulatory Visit (INDEPENDENT_AMBULATORY_CARE_PROVIDER_SITE_OTHER): Payer: BC Managed Care – PPO | Admitting: *Deleted

## 2011-03-01 DIAGNOSIS — Z7901 Long term (current) use of anticoagulants: Secondary | ICD-10-CM

## 2011-03-01 DIAGNOSIS — I4891 Unspecified atrial fibrillation: Secondary | ICD-10-CM

## 2011-03-01 LAB — POCT INR: INR: 2.6

## 2011-03-29 ENCOUNTER — Ambulatory Visit (INDEPENDENT_AMBULATORY_CARE_PROVIDER_SITE_OTHER): Payer: BC Managed Care – PPO | Admitting: *Deleted

## 2011-03-29 DIAGNOSIS — Z7901 Long term (current) use of anticoagulants: Secondary | ICD-10-CM

## 2011-03-29 DIAGNOSIS — I4891 Unspecified atrial fibrillation: Secondary | ICD-10-CM

## 2011-03-29 LAB — POCT INR: INR: 3

## 2011-04-26 ENCOUNTER — Ambulatory Visit (INDEPENDENT_AMBULATORY_CARE_PROVIDER_SITE_OTHER): Payer: BC Managed Care – PPO | Admitting: *Deleted

## 2011-04-26 DIAGNOSIS — Z7901 Long term (current) use of anticoagulants: Secondary | ICD-10-CM

## 2011-04-26 DIAGNOSIS — I4891 Unspecified atrial fibrillation: Secondary | ICD-10-CM

## 2011-04-26 LAB — POCT INR: INR: 2.9

## 2011-05-27 ENCOUNTER — Other Ambulatory Visit (HOSPITAL_COMMUNITY): Payer: Self-pay | Admitting: Internal Medicine

## 2011-05-27 ENCOUNTER — Ambulatory Visit (INDEPENDENT_AMBULATORY_CARE_PROVIDER_SITE_OTHER): Payer: BC Managed Care – PPO | Admitting: *Deleted

## 2011-05-27 DIAGNOSIS — I4891 Unspecified atrial fibrillation: Secondary | ICD-10-CM

## 2011-05-27 DIAGNOSIS — Z1231 Encounter for screening mammogram for malignant neoplasm of breast: Secondary | ICD-10-CM

## 2011-05-27 DIAGNOSIS — Z7901 Long term (current) use of anticoagulants: Secondary | ICD-10-CM

## 2011-06-24 ENCOUNTER — Ambulatory Visit (HOSPITAL_COMMUNITY)
Admission: RE | Admit: 2011-06-24 | Discharge: 2011-06-24 | Disposition: A | Discharge status: Home / Self Care | Payer: BC Managed Care – PPO | Source: Ambulatory Visit | Attending: Internal Medicine | Admitting: Internal Medicine

## 2011-06-24 ENCOUNTER — Encounter: Payer: BC Managed Care – PPO | Admitting: *Deleted

## 2011-06-24 DIAGNOSIS — Z1231 Encounter for screening mammogram for malignant neoplasm of breast: Secondary | ICD-10-CM

## 2011-07-08 ENCOUNTER — Encounter: Payer: BC Managed Care – PPO | Admitting: *Deleted

## 2011-07-09 ENCOUNTER — Ambulatory Visit (INDEPENDENT_AMBULATORY_CARE_PROVIDER_SITE_OTHER): Payer: BC Managed Care – PPO | Admitting: *Deleted

## 2011-07-09 DIAGNOSIS — Z7901 Long term (current) use of anticoagulants: Secondary | ICD-10-CM

## 2011-07-09 DIAGNOSIS — I4891 Unspecified atrial fibrillation: Secondary | ICD-10-CM

## 2011-07-09 LAB — POCT INR: INR: 2.9

## 2011-08-19 ENCOUNTER — Ambulatory Visit (INDEPENDENT_AMBULATORY_CARE_PROVIDER_SITE_OTHER): Payer: BC Managed Care – PPO | Admitting: Cardiovascular Disease

## 2011-08-19 ENCOUNTER — Ambulatory Visit (INDEPENDENT_AMBULATORY_CARE_PROVIDER_SITE_OTHER): Payer: BC Managed Care – PPO | Admitting: *Deleted

## 2011-08-19 ENCOUNTER — Encounter: Payer: Self-pay | Admitting: Cardiovascular Disease

## 2011-08-19 DIAGNOSIS — Z7901 Long term (current) use of anticoagulants: Secondary | ICD-10-CM

## 2011-08-19 DIAGNOSIS — I10 Essential (primary) hypertension: Secondary | ICD-10-CM

## 2011-08-19 DIAGNOSIS — I4891 Unspecified atrial fibrillation: Secondary | ICD-10-CM

## 2011-08-19 LAB — POCT INR: INR: 2.4

## 2011-08-19 NOTE — Progress Notes (Signed)
Tammy Robbins Date of Birth  September 05, 1945 [facility]     [place] [facility]  [address]    [address]   [address] [place], [place]  [address]    [place], [place]  [address] [phone]  Fax  [phone]  [phone]  Fax [phone]   History of Present Illness:  [name] is a 66 year old female with a history of atrial fibrillation. She's been on chronic Coumadin therapy. She's had a cardiac catheterization in the past which revealed smooth and normal coronary arteries. She also has a history of hypertension and hyperlipidemia. She has mild aortic stenosis. She's done well from a cardiac standpoint. She has not had any episodes of chest pain. She has had an upper respiratory tract infection since Thanksgiving.  Current Outpatient Prescriptions on File Prior to Visit  Medication Sig Dispense Refill  . carvedilol (COREG) 25 MG tablet Take 1 tablet (25 mg total) by mouth 2 (two) times daily.  180 tablet  3  . estradiol (ESTRACE) 0.1 MG/GM vaginal cream Place 2 g vaginally as needed.       . hydrochlorothiazide 25 MG tablet Take 1 tablet (25 mg total) by mouth daily.  90 tablet  3  . potassium chloride (K-DUR) 10 MEQ tablet Take 1 tablet (10 mEq total) by mouth 2 (two) times daily.  180 tablet  3  . simvastatin (ZOCOR) 20 MG tablet Take 20 mg by mouth at bedtime.        Marland Kitchen warfarin (COUMADIN) 5 MG tablet Take 1 tablet (5 mg total) by mouth daily.  30 tablet  11  . cephALEXin (KEFLEX) 250 MG capsule Take 250 mg by mouth daily.        . Clidinium-Chlordiazepoxide (LIBRAX PO) Take by mouth 2 (two) times daily as needed.       . digoxin (LANOXIN) 0.25 MG tablet Take 250 mcg by mouth daily.        . fish oil-omega-3 fatty acids 1000 MG capsule Take by mouth daily.        . fluconazole (DIFLUCAN) 150 MG tablet Take 150 mg by mouth once.        . Multiple Vitamin (MULTIVITAMIN) tablet Take 1 tablet by mouth daily.        . nitroGLYCERIN (NITROSTAT) 0.4 MG SL tablet Place 0.4  mg under the tongue every 5 (five) minutes as needed.        Marland Kitchen omeprazole (PRILOSEC) 20 MG capsule Take 20 mg by mouth daily.          No Known Allergies  Past Medical History  Diagnosis Date  . Chest tightness   . HTN (hypertension)   . Hyperlipemia   . Atrial fibrillation   . Mild aortic stenosis   . Diastolic dysfunction   . Mild tricuspid regurgitation   . Anxiety     Past Surgical History  Procedure Date  . Cardiac catheterization     NORMAL  . Cataract extraction   . Total knee arthroplasty     LEFT  . Partial hysterectomy   . Bunionectomy   . Cholecystectomy     History  Smoking status  . Never Smoker   Smokeless tobacco  . Not on file    History  Alcohol Use  . Yes    occassionally    Family History  Problem Relation Age of Onset  . Intracerebral hemorrhage Father   . Hypertension Father   . Pneumonia Mother   . Lung  cancer Brother   . Mental retardation Sister     Reviw of Systems:  Reviewed in the HPI.  All other systems are negative.  Physical Exam: BP 148/87  Pulse 96  Ht 5\' 4"  (1.626 m)  Wt 246 lb 12.8 oz (111.948 kg)  BMI 42.36 kg/m2 The patient is alert and oriented x 3.  The mood and affect are normal.   Skin: warm and dry.  Color is normal.    HEENT:   Normocephalic/atraumatic. Normal carotids. Mucous membranes are moist. Neck is supple.  Lungs: Lungs are clear   Heart: Irregularly irregular. She has a very soft systolic murmur.    Abdomen: Good bowel sounds. Her abdomen is nontender. There is no hepatosplenomegaly.  Extremities:  No clubbing cyanosis or edema.  Neuro:  Exam is nonfocal.    ECG: Atrial fibrillation. She has no ST or T wave changes  Assessment / Plan:

## 2011-08-19 NOTE — Assessment & Plan Note (Signed)
Her blood pressure is well-controlled. She is to continue with the diet and exercise program.

## 2011-08-19 NOTE — Patient Instructions (Signed)
Your physician wants you to follow-up in: yearly You will receive a reminder letter in the mail two months in advance. If you don't receive a letter, please call our office to schedule the follow-up appointment.  Your physician recommends that you continue on your current medications as directed. Please refer to the Current Medication list given to you today.

## 2011-08-19 NOTE — Assessment & Plan Note (Signed)
Tammy Robbins remains in chronic atrial fibrillation. She's on chronic Coumadin therapy. Her rate is well controlled. She will continue with her same medications.

## 2011-09-25 ENCOUNTER — Other Ambulatory Visit: Payer: Self-pay | Admitting: *Deleted

## 2011-09-25 ENCOUNTER — Other Ambulatory Visit: Payer: Self-pay | Admitting: Cardiovascular Disease

## 2011-09-25 MED ORDER — DIGOXIN 250 MCG PO TABS: 250.0000 ug | ORAL_TABLET | Freq: Every day | ORAL | Status: DC

## 2011-09-25 NOTE — Telephone Encounter (Signed)
New Refill   Patient refill of Digoxin, she can be reached at [phone] should you need additional information.    Verified [facility] [address] [place])

## 2011-09-27 ENCOUNTER — Other Ambulatory Visit: Payer: Self-pay | Admitting: Cardiovascular Disease

## 2011-09-27 MED ORDER — DIGOXIN 250 MCG PO TABS: 250.0000 ug | ORAL_TABLET | Freq: Every day | ORAL | Status: DC

## 2011-09-27 NOTE — Telephone Encounter (Signed)
[facility]ed [facility] and advised to change digoxin Rx. To # 90 with 3 refills and advised patient

## 2011-09-27 NOTE — Telephone Encounter (Signed)
New Problem   Patient has to have digoxin (LANOXIN) 0.25 MG tablet RX  (90 Day Supply) per insurance.  She can be reached at hm# for additional info.  [facility] [address] ([place], [place])

## 2011-09-27 NOTE — Telephone Encounter (Signed)
FU Call: Pt calling wanting to speak with nurse regarding pt needing 3 month supply of pt rx. Please return pt call to discuss further.

## 2011-09-27 NOTE — Telephone Encounter (Signed)
Spoke with patient re-90 day refill

## 2011-09-30 ENCOUNTER — Ambulatory Visit (INDEPENDENT_AMBULATORY_CARE_PROVIDER_SITE_OTHER): Payer: BC Managed Care – PPO

## 2011-09-30 DIAGNOSIS — Z7901 Long term (current) use of anticoagulants: Secondary | ICD-10-CM

## 2011-09-30 DIAGNOSIS — I4891 Unspecified atrial fibrillation: Secondary | ICD-10-CM

## 2011-10-28 ENCOUNTER — Other Ambulatory Visit: Payer: Self-pay | Admitting: *Deleted

## 2011-10-28 DIAGNOSIS — I4891 Unspecified atrial fibrillation: Secondary | ICD-10-CM

## 2011-10-28 MED ORDER — CARVEDILOL 25 MG PO TABS: 25.0000 mg | ORAL_TABLET | Freq: Two times a day (BID) | ORAL | Status: DC

## 2011-10-28 NOTE — Telephone Encounter (Signed)
Fax Received. Refill Completed. Ahtziri Jeffries Chowoe (R.M.A)   

## 2011-10-29 ENCOUNTER — Other Ambulatory Visit: Payer: Self-pay | Admitting: Cardiovascular Disease

## 2011-10-29 NOTE — Telephone Encounter (Signed)
Rx Refill- digoxin (LANOXIN) 0.25 MG tablet   Patient would like 90 day supply of RX, Pharmacy verified [facility] [address] [place], [place]. Patient can be reached at [phone] should there be any additional questions.

## 2011-10-29 NOTE — Telephone Encounter (Signed)
Refilled 09/27/11

## 2011-11-07 ENCOUNTER — Ambulatory Visit (INDEPENDENT_AMBULATORY_CARE_PROVIDER_SITE_OTHER): Payer: BC Managed Care – PPO | Admitting: Pharmacist

## 2011-11-07 DIAGNOSIS — Z7901 Long term (current) use of anticoagulants: Secondary | ICD-10-CM

## 2011-11-07 DIAGNOSIS — I4891 Unspecified atrial fibrillation: Secondary | ICD-10-CM

## 2011-11-07 LAB — PROTIME-INR: INR: 7.2 ratio (ref 0.8–1.0)

## 2011-11-14 ENCOUNTER — Ambulatory Visit: Payer: Self-pay | Admitting: Cardiology

## 2011-11-14 DIAGNOSIS — I4891 Unspecified atrial fibrillation: Secondary | ICD-10-CM

## 2011-11-25 ENCOUNTER — Ambulatory Visit (INDEPENDENT_AMBULATORY_CARE_PROVIDER_SITE_OTHER): Payer: BC Managed Care – PPO | Admitting: *Deleted

## 2011-11-25 DIAGNOSIS — I4891 Unspecified atrial fibrillation: Secondary | ICD-10-CM

## 2011-11-25 DIAGNOSIS — Z7901 Long term (current) use of anticoagulants: Secondary | ICD-10-CM

## 2011-11-25 LAB — POCT INR: INR: 2.6

## 2011-11-26 ENCOUNTER — Other Ambulatory Visit: Payer: Self-pay | Admitting: *Deleted

## 2011-11-26 NOTE — Telephone Encounter (Signed)
Opened in Error.

## 2011-11-28 ENCOUNTER — Telehealth: Payer: Self-pay | Admitting: Cardiovascular Disease

## 2011-11-28 DIAGNOSIS — I4891 Unspecified atrial fibrillation: Secondary | ICD-10-CM

## 2011-11-28 MED ORDER — CARVEDILOL 25 MG PO TABS: 25.0000 mg | ORAL_TABLET | Freq: Two times a day (BID) | ORAL | Status: DC

## 2011-11-28 NOTE — Telephone Encounter (Signed)
Refilled medication

## 2011-11-28 NOTE — Telephone Encounter (Signed)
Pt needs refill for 90 days supply carvedilol 25 mg, uses [facility] [place] , pharmacy requested Tuesday by fax, still not there, pt out of meds, needs called in asap, pt also requesting med list be changed to all meds be called in as 90 days supply

## 2011-12-16 ENCOUNTER — Ambulatory Visit (INDEPENDENT_AMBULATORY_CARE_PROVIDER_SITE_OTHER): Payer: BC Managed Care – PPO | Admitting: *Deleted

## 2011-12-16 DIAGNOSIS — Z7901 Long term (current) use of anticoagulants: Secondary | ICD-10-CM

## 2011-12-16 DIAGNOSIS — I4891 Unspecified atrial fibrillation: Secondary | ICD-10-CM

## 2011-12-16 LAB — POCT INR: INR: 2.9

## 2012-01-01 ENCOUNTER — Other Ambulatory Visit: Payer: Self-pay | Admitting: *Deleted

## 2012-01-01 DIAGNOSIS — I10 Essential (primary) hypertension: Secondary | ICD-10-CM

## 2012-01-01 MED ORDER — HYDROCHLOROTHIAZIDE 25 MG PO TABS: 25.0000 mg | ORAL_TABLET | Freq: Every day | ORAL | Status: DC

## 2012-01-01 MED ORDER — POTASSIUM CHLORIDE ER 10 MEQ PO TBCR: 10.0000 meq | EXTENDED_RELEASE_TABLET | Freq: Two times a day (BID) | ORAL | Status: DC

## 2012-01-14 ENCOUNTER — Ambulatory Visit (INDEPENDENT_AMBULATORY_CARE_PROVIDER_SITE_OTHER): Payer: BC Managed Care – PPO

## 2012-01-14 DIAGNOSIS — Z7901 Long term (current) use of anticoagulants: Secondary | ICD-10-CM

## 2012-01-14 DIAGNOSIS — I4891 Unspecified atrial fibrillation: Secondary | ICD-10-CM

## 2012-01-14 LAB — POCT INR: INR: 2.9

## 2012-01-30 ENCOUNTER — Other Ambulatory Visit: Payer: Self-pay | Admitting: Cardiovascular Disease

## 2012-01-30 DIAGNOSIS — I4891 Unspecified atrial fibrillation: Secondary | ICD-10-CM

## 2012-01-30 MED ORDER — WARFARIN SODIUM 5 MG PO TABS: 5.0000 mg | ORAL_TABLET | Freq: Every day | ORAL | Status: DC

## 2012-02-26 ENCOUNTER — Ambulatory Visit (INDEPENDENT_AMBULATORY_CARE_PROVIDER_SITE_OTHER): Payer: BC Managed Care – PPO | Admitting: *Deleted

## 2012-02-26 DIAGNOSIS — I4891 Unspecified atrial fibrillation: Secondary | ICD-10-CM

## 2012-02-26 DIAGNOSIS — Z7901 Long term (current) use of anticoagulants: Secondary | ICD-10-CM

## 2012-02-26 LAB — POCT INR: INR: 2.5

## 2012-04-08 ENCOUNTER — Ambulatory Visit (INDEPENDENT_AMBULATORY_CARE_PROVIDER_SITE_OTHER): Payer: BC Managed Care – PPO | Admitting: Pharmacist

## 2012-04-08 DIAGNOSIS — I4891 Unspecified atrial fibrillation: Secondary | ICD-10-CM

## 2012-04-08 DIAGNOSIS — Z7901 Long term (current) use of anticoagulants: Secondary | ICD-10-CM

## 2012-04-08 LAB — POCT INR: INR: 3.6

## 2012-05-06 ENCOUNTER — Ambulatory Visit (INDEPENDENT_AMBULATORY_CARE_PROVIDER_SITE_OTHER): Payer: Medicare Other | Admitting: *Deleted

## 2012-05-06 DIAGNOSIS — Z7901 Long term (current) use of anticoagulants: Secondary | ICD-10-CM

## 2012-05-06 DIAGNOSIS — I4891 Unspecified atrial fibrillation: Secondary | ICD-10-CM

## 2012-05-06 LAB — POCT INR: INR: 2.6

## 2012-05-20 ENCOUNTER — Other Ambulatory Visit (HOSPITAL_COMMUNITY): Payer: Self-pay | Admitting: Internal Medicine

## 2012-05-20 DIAGNOSIS — Z1231 Encounter for screening mammogram for malignant neoplasm of breast: Secondary | ICD-10-CM

## 2012-06-03 ENCOUNTER — Ambulatory Visit (INDEPENDENT_AMBULATORY_CARE_PROVIDER_SITE_OTHER): Payer: Medicare Other | Admitting: *Deleted

## 2012-06-03 DIAGNOSIS — I4891 Unspecified atrial fibrillation: Secondary | ICD-10-CM

## 2012-06-03 DIAGNOSIS — Z7901 Long term (current) use of anticoagulants: Secondary | ICD-10-CM

## 2012-06-15 ENCOUNTER — Other Ambulatory Visit: Payer: Self-pay | Admitting: Pharmacist

## 2012-06-15 DIAGNOSIS — I4891 Unspecified atrial fibrillation: Secondary | ICD-10-CM

## 2012-06-15 MED ORDER — WARFARIN SODIUM 5 MG PO TABS: 5.0000 mg | ORAL_TABLET | Freq: Every day | ORAL | Status: DC

## 2012-06-24 ENCOUNTER — Ambulatory Visit (HOSPITAL_COMMUNITY)
Admission: RE | Admit: 2012-06-24 | Discharge: 2012-06-24 | Disposition: A | Discharge status: Home / Self Care | Payer: Medicare Other | Source: Ambulatory Visit | Attending: Internal Medicine | Admitting: Internal Medicine

## 2012-06-24 DIAGNOSIS — Z1231 Encounter for screening mammogram for malignant neoplasm of breast: Secondary | ICD-10-CM | POA: Insufficient documentation

## 2012-07-01 ENCOUNTER — Ambulatory Visit (INDEPENDENT_AMBULATORY_CARE_PROVIDER_SITE_OTHER): Payer: Medicare Other | Admitting: *Deleted

## 2012-07-01 DIAGNOSIS — I4891 Unspecified atrial fibrillation: Secondary | ICD-10-CM

## 2012-07-01 DIAGNOSIS — Z7901 Long term (current) use of anticoagulants: Secondary | ICD-10-CM

## 2012-07-01 LAB — POCT INR: INR: 3.1

## 2012-07-08 ENCOUNTER — Other Ambulatory Visit: Payer: Self-pay | Admitting: Cardiovascular Disease

## 2012-07-08 DIAGNOSIS — I10 Essential (primary) hypertension: Secondary | ICD-10-CM

## 2012-07-08 MED ORDER — POTASSIUM CHLORIDE ER 10 MEQ PO TBCR: 10.0000 meq | EXTENDED_RELEASE_TABLET | Freq: Two times a day (BID) | ORAL | Status: DC

## 2012-07-10 ENCOUNTER — Other Ambulatory Visit: Payer: Self-pay | Admitting: *Deleted

## 2012-07-10 DIAGNOSIS — I10 Essential (primary) hypertension: Secondary | ICD-10-CM

## 2012-07-10 MED ORDER — HYDROCHLOROTHIAZIDE 25 MG PO TABS: 25.0000 mg | ORAL_TABLET | Freq: Every day | ORAL | Status: DC

## 2012-07-29 ENCOUNTER — Ambulatory Visit (INDEPENDENT_AMBULATORY_CARE_PROVIDER_SITE_OTHER): Payer: Medicare Other | Admitting: *Deleted

## 2012-07-29 DIAGNOSIS — Z7901 Long term (current) use of anticoagulants: Secondary | ICD-10-CM

## 2012-07-29 DIAGNOSIS — I4891 Unspecified atrial fibrillation: Secondary | ICD-10-CM

## 2012-07-29 LAB — POCT INR: INR: 2.1

## 2012-08-07 ENCOUNTER — Ambulatory Visit (INDEPENDENT_AMBULATORY_CARE_PROVIDER_SITE_OTHER): Payer: Medicare Other | Admitting: *Deleted

## 2012-08-07 DIAGNOSIS — I4891 Unspecified atrial fibrillation: Secondary | ICD-10-CM

## 2012-08-07 DIAGNOSIS — Z7901 Long term (current) use of anticoagulants: Secondary | ICD-10-CM

## 2012-08-07 LAB — POCT INR: INR: 4.8

## 2012-08-20 ENCOUNTER — Ambulatory Visit (INDEPENDENT_AMBULATORY_CARE_PROVIDER_SITE_OTHER): Payer: Medicare Other | Admitting: *Deleted

## 2012-08-20 DIAGNOSIS — Z7901 Long term (current) use of anticoagulants: Secondary | ICD-10-CM

## 2012-08-20 DIAGNOSIS — I4891 Unspecified atrial fibrillation: Secondary | ICD-10-CM

## 2012-08-25 ENCOUNTER — Other Ambulatory Visit: Payer: Self-pay | Admitting: *Deleted

## 2012-08-25 DIAGNOSIS — I4891 Unspecified atrial fibrillation: Secondary | ICD-10-CM

## 2012-08-25 MED ORDER — CARVEDILOL 25 MG PO TABS: 25.0000 mg | ORAL_TABLET | Freq: Two times a day (BID) | ORAL | Status: DC

## 2012-08-25 NOTE — Telephone Encounter (Signed)
NO REFILLS UNTIL APPOINTMENT Fax Received. Refill Completed. Noni Stonesifer Chowoe (R.M.A)   

## 2012-08-27 ENCOUNTER — Ambulatory Visit (INDEPENDENT_AMBULATORY_CARE_PROVIDER_SITE_OTHER): Payer: Medicare Other | Admitting: *Deleted

## 2012-08-27 DIAGNOSIS — Z7901 Long term (current) use of anticoagulants: Secondary | ICD-10-CM

## 2012-08-27 DIAGNOSIS — I4891 Unspecified atrial fibrillation: Secondary | ICD-10-CM

## 2012-09-10 ENCOUNTER — Ambulatory Visit (INDEPENDENT_AMBULATORY_CARE_PROVIDER_SITE_OTHER): Payer: Medicare Other

## 2012-09-10 ENCOUNTER — Ambulatory Visit (INDEPENDENT_AMBULATORY_CARE_PROVIDER_SITE_OTHER): Payer: Medicare Other | Admitting: Cardiovascular Disease

## 2012-09-10 ENCOUNTER — Encounter: Payer: Self-pay | Admitting: Cardiovascular Disease

## 2012-09-10 VITALS — BP 122/80 | HR 70 | Ht 64.0 in | Wt 243.8 lb

## 2012-09-10 DIAGNOSIS — Z7901 Long term (current) use of anticoagulants: Secondary | ICD-10-CM

## 2012-09-10 DIAGNOSIS — E785 Hyperlipidemia, unspecified: Secondary | ICD-10-CM

## 2012-09-10 DIAGNOSIS — I10 Essential (primary) hypertension: Secondary | ICD-10-CM

## 2012-09-10 DIAGNOSIS — I4891 Unspecified atrial fibrillation: Secondary | ICD-10-CM

## 2012-09-10 LAB — LIPID PANEL
HDL: 35.3 mg/dL — ABNORMAL LOW (ref >39.00)
LDL Cholesterol: 52 mg/dL (ref 0–99)
VLDL: 37.2 mg/dL (ref 0.0–40.0)

## 2012-09-10 LAB — HEPATIC FUNCTION PANEL
AST: 22 U/L (ref 0–37)
Albumin: 3.4 g/dL — ABNORMAL LOW (ref 3.5–5.2)
Alkaline Phosphatase: 55 U/L (ref 39–117)
Total Bilirubin: 0.6 mg/dL (ref 0.3–1.2)

## 2012-09-10 LAB — BASIC METABOLIC PANEL
Calcium: 9.2 mg/dL (ref 8.4–10.5)
GFR: 83.58 mL/min (ref >60.00)
Potassium: 4 mEq/L (ref 3.5–5.1)
Sodium: 140 mEq/L (ref 135–145)

## 2012-09-10 NOTE — Assessment & Plan Note (Signed)
Her blood pressure is well-controlled. We'll continue with her same medications. I told him that weight loss would help with her blood pressure control.

## 2012-09-10 NOTE — Assessment & Plan Note (Signed)
We'll check fasting lipids, hepatic profile, and basic metabolic profile today. We'll also check them again in one year when I see her again for an office visit.

## 2012-09-10 NOTE — Progress Notes (Signed)
Tammy Robbins Date of Birth  1946/08/01 [facility]      Office  [address]    [address]   [address] [place], [place]  [address]    [place], [place]  [address] [phone]  Fax  [phone]  [phone]  Fax [phone]  Problems: 1. Atrial fibrillation 3. Hypertension 3. Hyperlipidemia 4. History chest pain-smooth and normal coronary arteries by cardiac catheterization has 5. Hypothyroidism 6. Arthritis 7. Aortic stenosis - mild  8. obesity  History of Present Illness:  [name] is a 67 year old female with a history of atrial fibrillation. She's been on chronic Coumadin therapy. She's had a cardiac catheterization in the past which revealed smooth and normal coronary arteries. She also has a history of hypertension and hyperlipidemia. She has mild aortic stenosis. She's done well from a cardiac standpoint. She has not had any episodes of chest pain. She has had an upper respiratory tract infection since Thanksgiving.  September 10, 2102: [name] has a history of atrial fibrillation. She's been on chronic Coumadin therapy. Her INR levels here.  She's been having some problems with arthritis recently. She denies any cardiac problems. She denies any chest pain or shortness breath or palpitations. She remains active but is not getting any regular exercise.  Current Outpatient Prescriptions on File Prior to Visit  Medication Sig Dispense Refill  . carvedilol (COREG) 25 MG tablet Take 1 tablet (25 mg total) by mouth 2 (two) times daily.  180 tablet  0  . cephALEXin (KEFLEX) 250 MG capsule Take 250 mg by mouth daily.        . Clidinium-Chlordiazepoxide (LIBRAX PO) Take by mouth 2 (two) times daily as needed.       . digoxin (LANOXIN) 0.25 MG tablet Take 1 tablet (250 mcg total) by mouth daily.  90 tablet  3  . estradiol (ESTRACE) 0.1 MG/GM vaginal cream Place 2 g vaginally as needed.       . fish oil-omega-3 fatty acids 1000 MG capsule Take by mouth  daily.        . fluconazole (DIFLUCAN) 150 MG tablet Take 150 mg by mouth once.        . hydrochlorothiazide (HYDRODIURIL) 25 MG tablet Take 1 tablet (25 mg total) by mouth daily.  90 tablet  0  . levothyroxine (SYNTHROID, LEVOTHROID) 50 MCG tablet Take 50 mcg by mouth daily.        . Multiple Vitamin (MULTIVITAMIN) tablet Take 1 tablet by mouth daily.        . nitroGLYCERIN (NITROSTAT) 0.4 MG SL tablet Place 0.4 mg under the tongue every 5 (five) minutes as needed.        Marland Kitchen omeprazole (PRILOSEC) 20 MG capsule Take 20 mg by mouth daily.        . potassium chloride (K-DUR) 10 MEQ tablet Take 1 tablet (10 mEq total) by mouth 2 (two) times daily.  180 tablet  0  . simvastatin (ZOCOR) 20 MG tablet Take 20 mg by mouth at bedtime.        Marland Kitchen warfarin (COUMADIN) 5 MG tablet Take 1 tablet (5 mg total) by mouth daily.  30 tablet  3    No Known Allergies  Past Medical History  Diagnosis Date  . Chest tightness   . HTN (hypertension)   . Hyperlipemia   . Atrial fibrillation   . Mild aortic stenosis   . Diastolic dysfunction   . Mild tricuspid regurgitation   . Anxiety  Past Surgical History  Procedure Date  . Cardiac catheterization     NORMAL  . Cataract extraction   . Total knee arthroplasty     LEFT  . Partial hysterectomy   . Bunionectomy   . Cholecystectomy     History  Smoking status  . Never Smoker   Smokeless tobacco  . Not on file    History  Alcohol Use  . Yes    Comment: occassionally    Family History  Problem Relation Age of Onset  . Intracerebral hemorrhage Father   . Hypertension Father   . Pneumonia Mother   . Lung cancer Brother   . Mental retardation Sister     Reviw of Systems:  Reviewed in the HPI.  All other systems are negative.  Physical Exam: BP 122/80  Pulse 70  Ht 5\' 4"  (1.626 m)  Wt 243 lb 12.8 oz (110.587 kg)  BMI 41.85 kg/m2  SpO2 99% The patient is alert and oriented x 3.  The mood and affect are normal.   Skin: warm and dry.   Color is normal.    HEENT:   Normocephalic/atraumatic. Normal carotids. Mucous membranes are moist. Neck is supple.  Lungs: Lungs are clear   Heart: Irregularly irregular. She has a very soft systolic murmur.    Abdomen: Good bowel sounds. Her abdomen is nontender. There is no hepatosplenomegaly.  Extremities:  No clubbing cyanosis or edema.  Neuro:  Exam is nonfocal.    ECG: September 10, 2012:  Atrial fibrillation at 17. She has no ST or T wave changes  Assessment / Plan:

## 2012-09-10 NOTE — Assessment & Plan Note (Signed)
Madylyn is doing very well. She's not having episodes of chest pain or shortness breath. She complains of being generally fatigued.  Her rate is well controlled.  I have Recommended that she work on a good diet, exercise, and weight loss program. I suspect a lot of her fatigue is due to deconditioning. Her left ventricular systolic function is normal. She does have some mild LVH with diastolic dysfunction.

## 2012-09-10 NOTE — Patient Instructions (Addendum)
Your physician recommends that you return for a FASTING lipid profile: TODAY  Your physician recommends that you return for a FASTING lipid profile: 6 MONTHS  Your physician wants you to follow-up in: 6 MONTHS WITH EKG You will receive a reminder letter in the mail two months in advance. If you don't receive a letter, please call our office to schedule the follow-up appointment.  Your physician recommends that you continue on your current medications as directed. Please refer to the Current Medication list given to you today.

## 2012-09-15 ENCOUNTER — Telehealth: Payer: Self-pay | Admitting: Cardiovascular Disease

## 2012-09-15 NOTE — Telephone Encounter (Signed)
Tammy Robbins spoke with pt in regard to interactions.

## 2012-09-15 NOTE — Telephone Encounter (Signed)
New Problem    Was put on hydrocodone Hcetaminophn 10-325. Wants to make sure it will not interfere with Coumadin. Would like to speak to someone.

## 2012-09-18 ENCOUNTER — Telehealth: Payer: Self-pay | Admitting: Cardiovascular Disease

## 2012-09-18 NOTE — Telephone Encounter (Signed)
New Problem:    Patient called in because she was placed on a new medication for her gout.  Please call back.

## 2012-09-18 NOTE — Telephone Encounter (Signed)
Pt called stating had been started on Prednisone 10mg  tabs 4 taken on 0206/2014 then 3 tabs for 3 days  2 tabs for 3 days  1 and 1/2 tabs for 3 days  then 1 tablet for 10 days  then  1/2 tablet each am and pt placed on Colcrys 0.6mg   took tabs 2 yesterday  then  2 tablets daily for 10 days  then 1 daily . Pt appt moved to Monday 09/21/2012

## 2012-09-21 ENCOUNTER — Ambulatory Visit (INDEPENDENT_AMBULATORY_CARE_PROVIDER_SITE_OTHER): Payer: Medicare Other | Admitting: *Deleted

## 2012-09-21 DIAGNOSIS — Z7901 Long term (current) use of anticoagulants: Secondary | ICD-10-CM

## 2012-09-21 DIAGNOSIS — I4891 Unspecified atrial fibrillation: Secondary | ICD-10-CM

## 2012-09-21 LAB — POCT INR: INR: 2.6

## 2012-09-26 ENCOUNTER — Other Ambulatory Visit: Payer: Self-pay

## 2012-10-13 ENCOUNTER — Other Ambulatory Visit: Payer: Self-pay | Admitting: *Deleted

## 2012-10-13 DIAGNOSIS — I10 Essential (primary) hypertension: Secondary | ICD-10-CM

## 2012-10-13 MED ORDER — HYDROCHLOROTHIAZIDE 25 MG PO TABS: 25.0000 mg | ORAL_TABLET | Freq: Every day | ORAL | Status: DC

## 2012-10-13 MED ORDER — POTASSIUM CHLORIDE ER 10 MEQ PO TBCR: 10.0000 meq | EXTENDED_RELEASE_TABLET | Freq: Two times a day (BID) | ORAL | Status: DC

## 2012-10-13 NOTE — Telephone Encounter (Signed)
Fax Received. Refill Completed. Octavia Chowoe (R.M.A)   

## 2012-10-19 ENCOUNTER — Ambulatory Visit (INDEPENDENT_AMBULATORY_CARE_PROVIDER_SITE_OTHER): Payer: Medicare Other | Admitting: *Deleted

## 2012-10-19 DIAGNOSIS — Z7901 Long term (current) use of anticoagulants: Secondary | ICD-10-CM

## 2012-10-19 DIAGNOSIS — I4891 Unspecified atrial fibrillation: Secondary | ICD-10-CM

## 2012-11-09 ENCOUNTER — Ambulatory Visit (INDEPENDENT_AMBULATORY_CARE_PROVIDER_SITE_OTHER): Payer: Medicare Other | Admitting: Pharmacist

## 2012-11-09 DIAGNOSIS — Z7901 Long term (current) use of anticoagulants: Secondary | ICD-10-CM

## 2012-11-09 DIAGNOSIS — I4891 Unspecified atrial fibrillation: Secondary | ICD-10-CM

## 2012-11-09 LAB — POCT INR: INR: 3.6

## 2012-11-23 ENCOUNTER — Other Ambulatory Visit: Payer: Self-pay | Admitting: *Deleted

## 2012-11-23 DIAGNOSIS — I4891 Unspecified atrial fibrillation: Secondary | ICD-10-CM

## 2012-11-23 MED ORDER — DIGOXIN 250 MCG PO TABS: 250.0000 ug | ORAL_TABLET | Freq: Every day | ORAL | Status: DC

## 2012-11-23 MED ORDER — CARVEDILOL 25 MG PO TABS: 25.0000 mg | ORAL_TABLET | Freq: Two times a day (BID) | ORAL | Status: DC

## 2012-11-23 NOTE — Telephone Encounter (Signed)
Fax Received. Refill Completed. Sayaka Hoeppner Chowoe (R.M.A)   

## 2012-11-23 NOTE — Telephone Encounter (Signed)
Fax Received. Refill Completed. Shanine Kreiger Chowoe (R.M.A)   

## 2012-11-24 ENCOUNTER — Ambulatory Visit (INDEPENDENT_AMBULATORY_CARE_PROVIDER_SITE_OTHER): Payer: Medicare Other

## 2012-11-24 DIAGNOSIS — I4891 Unspecified atrial fibrillation: Secondary | ICD-10-CM

## 2012-11-24 DIAGNOSIS — Z7901 Long term (current) use of anticoagulants: Secondary | ICD-10-CM

## 2012-11-24 LAB — POCT INR: INR: 2.5

## 2012-12-11 ENCOUNTER — Telehealth: Payer: Self-pay | Admitting: Cardiovascular Disease

## 2012-12-11 NOTE — Telephone Encounter (Signed)
New Problem:    Patient called in because she has a UTI and wanted to know if taking Cephalexin 250mg  would interfere with her warfarin (COUMADIN) 5 MG tablet.  Please call back.

## 2012-12-11 NOTE — Telephone Encounter (Signed)
Spoke with pt.  Okay to take cephalexin with Coumadin.

## 2012-12-16 ENCOUNTER — Ambulatory Visit (INDEPENDENT_AMBULATORY_CARE_PROVIDER_SITE_OTHER): Payer: Medicare Other | Admitting: *Deleted

## 2012-12-16 DIAGNOSIS — Z7901 Long term (current) use of anticoagulants: Secondary | ICD-10-CM

## 2012-12-16 DIAGNOSIS — I4891 Unspecified atrial fibrillation: Secondary | ICD-10-CM

## 2012-12-16 LAB — POCT INR: INR: 2.5

## 2012-12-18 ENCOUNTER — Other Ambulatory Visit: Payer: Self-pay

## 2012-12-18 DIAGNOSIS — I4891 Unspecified atrial fibrillation: Secondary | ICD-10-CM

## 2012-12-18 MED ORDER — WARFARIN SODIUM 5 MG PO TABS: 5.0000 mg | ORAL_TABLET | Freq: Every day | ORAL | Status: DC

## 2013-01-12 ENCOUNTER — Ambulatory Visit (INDEPENDENT_AMBULATORY_CARE_PROVIDER_SITE_OTHER): Payer: Medicare Other

## 2013-01-12 DIAGNOSIS — Z7901 Long term (current) use of anticoagulants: Secondary | ICD-10-CM

## 2013-01-12 DIAGNOSIS — I4891 Unspecified atrial fibrillation: Secondary | ICD-10-CM

## 2013-01-27 ENCOUNTER — Telehealth: Payer: Self-pay | Admitting: Cardiovascular Disease

## 2013-01-27 NOTE — Telephone Encounter (Signed)
New Prob     Pt has recently been put on PREDENISONE and ALLOPURINOL. Wants to know if this will interfere with her blood thinner. Please call.

## 2013-01-27 NOTE — Telephone Encounter (Signed)
Spoke with pt.  She is on prednisone 5mg  daily until 7/8 then decrease to 5mg  every other day until 7/15.  Allopurinol is 1/2 tablet x 8 days then increase to 1 daily.   Both can increase INR.  Will reschedule appt to check INR during middle of therapy.

## 2013-02-09 ENCOUNTER — Ambulatory Visit (INDEPENDENT_AMBULATORY_CARE_PROVIDER_SITE_OTHER): Payer: Medicare Other | Admitting: *Deleted

## 2013-02-09 DIAGNOSIS — I4891 Unspecified atrial fibrillation: Secondary | ICD-10-CM

## 2013-02-09 DIAGNOSIS — Z7901 Long term (current) use of anticoagulants: Secondary | ICD-10-CM

## 2013-02-09 LAB — POCT INR: INR: 2.2

## 2013-03-10 ENCOUNTER — Encounter: Payer: Self-pay | Admitting: Cardiovascular Disease

## 2013-03-10 ENCOUNTER — Ambulatory Visit (INDEPENDENT_AMBULATORY_CARE_PROVIDER_SITE_OTHER): Payer: Medicare Other | Admitting: Cardiovascular Disease

## 2013-03-10 ENCOUNTER — Ambulatory Visit (INDEPENDENT_AMBULATORY_CARE_PROVIDER_SITE_OTHER): Payer: Medicare Other | Admitting: Pharmacist

## 2013-03-10 VITALS — BP 128/92 | HR 61 | Ht 64.0 in | Wt 244.8 lb

## 2013-03-10 DIAGNOSIS — Z7901 Long term (current) use of anticoagulants: Secondary | ICD-10-CM

## 2013-03-10 DIAGNOSIS — E785 Hyperlipidemia, unspecified: Secondary | ICD-10-CM

## 2013-03-10 DIAGNOSIS — I4891 Unspecified atrial fibrillation: Secondary | ICD-10-CM

## 2013-03-10 DIAGNOSIS — I10 Essential (primary) hypertension: Secondary | ICD-10-CM

## 2013-03-10 LAB — HEPATIC FUNCTION PANEL
AST: 23 U/L (ref 0–37)
Alkaline Phosphatase: 51 U/L (ref 39–117)
Bilirubin, Direct: 0 mg/dL (ref 0.0–0.3)

## 2013-03-10 LAB — BASIC METABOLIC PANEL
BUN: 15 mg/dL (ref 6–23)
CO2: 29 mEq/L (ref 19–32)
Calcium: 9.4 mg/dL (ref 8.4–10.5)
Chloride: 106 mEq/L (ref 96–112)
Creatinine, Ser: 0.8 mg/dL (ref 0.4–1.2)

## 2013-03-10 LAB — POCT INR: INR: 2

## 2013-03-10 LAB — LIPID PANEL
LDL Cholesterol: 50 mg/dL (ref 0–99)
Total CHOL/HDL Ratio: 4
Triglycerides: 176 mg/dL — ABNORMAL HIGH (ref 0.0–149.0)

## 2013-03-10 NOTE — Progress Notes (Signed)
Tammy Robbins Date of Birth  October 24, 1945 [facility]     [place] Office  [address]    [address]   [address] [place], [place]  [address]    [place], [place]  [address] [phone]  Fax  [phone]  [phone]  Fax [phone]  Problems: 1. Atrial fibrillation 3. Hypertension 3. Hyperlipidemia 4. History chest pain-smooth and normal coronary arteries by cardiac catheterization   5. Hypothyroidism 6. Arthritis 7. Aortic stenosis - mild  8. obesity  History of Present Illness:  Tammy Robbins is a 67 year old female with a history of atrial fibrillation. She's been on chronic Coumadin therapy. She's had a cardiac catheterization in the past which revealed smooth and normal coronary arteries. She also has a history of hypertension and hyperlipidemia. She has mild aortic stenosis. She's done well from a cardiac standpoint. She has not had any episodes of chest pain. She has had an upper respiratory tract infection since Thanksgiving.  September 10, 2102: Tammy Robbins has a history of atrial fibrillation. She's been on chronic Coumadin therapy. Her INR levels here.  She's been having some problems with arthritis recently. She denies any cardiac problems. She denies any chest pain or shortness breath or palpitations. She remains active but is not getting any regular exercise.  March 10, 2013:  Tammy Robbins is doing well. She is doing low impact exercises.    No CP or dyspnea.    Current Outpatient Prescriptions on File Prior to Visit  Medication Sig Dispense Refill  . allopurinol (ZYLOPRIM) 300 MG tablet Take 300 mg by mouth daily.      . carvedilol (COREG) 25 MG tablet Take 1 tablet (25 mg total) by mouth 2 (two) times daily.  180 tablet  4  . Clidinium-Chlordiazepoxide (LIBRAX PO) Take by mouth 2 (two) times daily as needed.       . Colchicine (COLCRYS PO) Take 1 tablet by mouth as directed. Dr Ethelene Hal      . digoxin (LANOXIN) 0.25 MG tablet Take 1 tablet (250 mcg total) by  mouth daily.  90 tablet  3  . estradiol (ESTRACE) 0.1 MG/GM vaginal cream Place 2 g vaginally as needed.       . fish oil-omega-3 fatty acids 1000 MG capsule Take by mouth daily.        . fluconazole (DIFLUCAN) 150 MG tablet Take 150 mg by mouth once.        . hydrochlorothiazide (HYDRODIURIL) 25 MG tablet Take 1 tablet (25 mg total) by mouth daily.  90 tablet  3  . HYDROcodone-acetaminophen (NORCO) 10-325 MG per tablet Take 1 tablet by mouth every 6 (six) hours as needed for pain. Order by Dr Su Hilt      . levothyroxine (SYNTHROID, LEVOTHROID) 50 MCG tablet Take 50 mcg by mouth daily.        . Multiple Vitamin (MULTIVITAMIN) tablet Take 1 tablet by mouth daily.        . nitroGLYCERIN (NITROSTAT) 0.4 MG SL tablet Place 0.4 mg under the tongue every 5 (five) minutes as needed.        Marland Kitchen omeprazole (PRILOSEC) 20 MG capsule Take 20 mg by mouth daily.        . potassium chloride (K-DUR) 10 MEQ tablet Take 1 tablet (10 mEq total) by mouth 2 (two) times daily.  180 tablet  3  . simvastatin (ZOCOR) 20 MG tablet Take 20 mg by mouth at bedtime.        Marland Kitchen warfarin (COUMADIN)  5 MG tablet Take 1 tablet (5 mg total) by mouth daily.  30 tablet  3   No current facility-administered medications on file prior to visit.    No Known Allergies  Past Medical History  Diagnosis Date  . Chest tightness   . HTN (hypertension)   . Hyperlipemia   . Atrial fibrillation   . Mild aortic stenosis   . Diastolic dysfunction   . Mild tricuspid regurgitation   . Anxiety     Past Surgical History  Procedure Laterality Date  . Cardiac catheterization      NORMAL  . Cataract extraction    . Total knee arthroplasty      LEFT  . Partial hysterectomy    . Bunionectomy    . Cholecystectomy      History  Smoking status  . Never Smoker   Smokeless tobacco  . Not on file    History  Alcohol Use  . Yes    Comment: occassionally    Family History  Problem Relation Age of Onset  . Intracerebral hemorrhage  Father   . Hypertension Father   . Pneumonia Mother   . Lung cancer Brother   . Mental retardation Sister     Reviw of Systems:  Reviewed in the HPI.  All other systems are negative.  Physical Exam: BP 128/92  Pulse 61  Ht 5\' 4"  (1.626 m)  Wt 244 lb 12.8 oz (111.041 kg)  BMI 42 kg/m2  SpO2 99% The patient is alert and oriented x 3.  The mood and affect are normal.   Skin: warm and dry.  Color is normal.    HEENT:   Normocephalic/atraumatic. Normal carotids. Mucous membranes are moist. Neck is supple.  Lungs: Lungs are clear   Heart: Irregularly irregular. She has a very soft systolic murmur.    Abdomen: Good bowel sounds. Her abdomen is nontender. There is no hepatosplenomegaly.  Extremities:  No clubbing cyanosis or edema.  Neuro:  Exam is nonfocal.    ECG: September 10, 2012:  Atrial fibrillation at 79. She has no ST or T wave changes  Assessment / Plan:

## 2013-03-10 NOTE — Assessment & Plan Note (Signed)
Her blood pressure remains mildly elevated. I've encouraged her to cut back her salt intake. She still eats salt with eggs and other foods.

## 2013-03-10 NOTE — Patient Instructions (Addendum)
Your physician recommends that you return for a FASTING lipid profile: today  Your physician wants you to follow-up in: 6 months You will receive a reminder letter in the mail two months in advance. If you don't receive a letter, please call our office to schedule the follow-up appointment.  REDUCE HIGH SODIUM FOODS LIKE CANNED SOUP, GRAVY, SAUCES, READY PREPARED FOODS LIKE FROZEN FOODS; LEAN CUISINE, LASAGNA. BACON, SAUSAGE, LUNCH MEAT, FAST FOODS, HOT DOGS, CHIPS, PIZZA.    The [facility] Low Glycemic Diet (Source: [facility], 2006) Low Glycemic Foods (20-49) (Decrease risk of developing heart disease) Breakfast Cereals: All-Bran All-Bran Fruit 'n Oats Fiber One Oatmeal (not instant) Oat bran Fruits and fruit juices: (Limit to 1-2 servings per day) Apples Apricots (fresh & dried) Blackberries Blueberries Cherries Cranberries Peaches Pears Plums Prunes Grapefruit Raspberries Strawberries Tangerine Apple juice Grapefruit juice Tomato juice Beans and legumes (fresh-cooked): Black-eyed peas Butter beans Chick peas Lentils  Green beans Lima beans Kidney beans Navy beans Pinto beans Snow peas Non-starchy vegetables: Asparagus, avocado, broccoli, cabbage, cauliflower, celery, cucumber, greens, lettuce, mushrooms, peppers, tomatoes, okra, onions, spinach, summer squash Grains: Barley Bulgur Rye Wild rice Nuts and oils : Almonds Peanuts Sunflower seeds Hazelnuts Pecans Walnuts Oils that are liquid at room temperature Dairy, fish, meat, soy, and eggs: Milk, skim Lowfat cheese Yogurt, lowfat, fruit sugar sweetened Lean red meat Fish  Skinless chicken & [place] Shellfish Egg whites (up to 3 daily) Soy products  Egg yolks (up to 7 or _____ per week) Moderate Glycemic Foods (50-69) Breakfast Cereals: Bran Buds Bran Chex Just Right Mini-Wheats  Special K Swiss muesli Fruits: Banana (under-ripe) Dates Figs Grapes Kiwi Mango Oranges Raisins Fruit  Juices: Cranberry juice Orange juice Beans and legumes: Boston-type baked beans Canned pinto, kidney, or navy beans Green peas Vegetables: Beets Carrots  Sweet potato Yam Corn on the cob Breads: Pita (pocket) bread Oat bran bread Pumpernickel bread Rye bread Wheat bread, high fiber  Grains: Cornmeal Rice, brown Rice, white Couscous Pasta: Macaroni Pizza, cheese Ravioli, meat filled Spaghetti, white  Nuts: Cashews Macadamia Snacks: Chocolate Ice cream, lowfat Muffin Popcorn High Glycemic Foods (70-100)  Breakfast Cereals: Cheerios Corn Chex Corn Flakes Cream of Wheat Grape Nuts Grape Nut Flakes Grits Nutri-Grain Puffed Rice Puffed Wheat Rice Chex Rice Krispies Shredded Wheat Team Total Fruits: Pineapple Watermelon Banana (over-ripe) Beverages: Sodas, sweet tea, pineapple juice Vegetables: Potato, baked, boiled, fried, mashed [place] fries Canned or frozen corn Parsnips Winter squash Breads: Most breads (white and whole grain) Bagels Bread sticks Bread stuffing Kaiser roll Dinner rolls Grains: Rice, instant Tapioca, with milk Candy and most cookies Snacks: Donuts Corn chips Jelly beans Pretzels Pastries

## 2013-03-10 NOTE — Assessment & Plan Note (Signed)
Tammy Robbins's  triglyceride levels were mildly elevated at her last visit. I've encouraged her to work on a good diet and weight loss program. I've given her information on the [facility] low glycemic index diet.

## 2013-03-10 NOTE — Assessment & Plan Note (Signed)
Tammy Robbins is doing well. She remains in atrial fibrillation. She's not having any symptoms of chest pain or shortness of breath.  I've encouraged her to work on a better diet and exercise program. Last lipid level revealed mildly elevated triglyceride levels.   I've given her information on the [facility] low glycemic index diet.  I'll see her again in 6 months for followup visit.

## 2013-03-15 ENCOUNTER — Telehealth: Payer: Self-pay | Admitting: Cardiovascular Disease

## 2013-03-15 NOTE — Telephone Encounter (Signed)
Follow Up ° ° ° ° °Pt calling following up on test results. Please call. °

## 2013-03-15 NOTE — Telephone Encounter (Signed)
Results were reviewed.

## 2013-03-17 ENCOUNTER — Other Ambulatory Visit: Payer: Self-pay

## 2013-04-19 ENCOUNTER — Ambulatory Visit (INDEPENDENT_AMBULATORY_CARE_PROVIDER_SITE_OTHER): Payer: Medicare Other | Admitting: *Deleted

## 2013-04-19 DIAGNOSIS — I4891 Unspecified atrial fibrillation: Secondary | ICD-10-CM

## 2013-04-19 DIAGNOSIS — Z7901 Long term (current) use of anticoagulants: Secondary | ICD-10-CM

## 2013-05-06 ENCOUNTER — Telehealth: Payer: Self-pay | Admitting: Pharmacist

## 2013-05-06 NOTE — Telephone Encounter (Signed)
New Problem  Pt is on Blood thinners// primary care put her on meclizine.. She asks if this will interfere with her coumadin.Marland Kitchen Please call and advise.

## 2013-05-06 NOTE — Telephone Encounter (Signed)
Spoke with pt and made her aware that there is not interaction with coumadin and meclizine. Instructed her to call with there are anymore med changes.

## 2013-05-17 ENCOUNTER — Telehealth: Payer: Self-pay | Admitting: Cardiovascular Disease

## 2013-05-17 ENCOUNTER — Other Ambulatory Visit (HOSPITAL_COMMUNITY): Payer: Self-pay | Admitting: Internal Medicine

## 2013-05-17 DIAGNOSIS — Z1231 Encounter for screening mammogram for malignant neoplasm of breast: Secondary | ICD-10-CM

## 2013-05-17 NOTE — Telephone Encounter (Signed)
Informed office that Dr Elease Hashimoto will not be in till later in the week and they will receive word then.

## 2013-05-17 NOTE — Telephone Encounter (Signed)
Coumadin clinic has not seen a request.

## 2013-05-17 NOTE — Telephone Encounter (Signed)
New problem    Fax request sent on  9/22 . Please advise on coumadin to hold before colonoscopy for 5 days.   Procedure on  10/21.

## 2013-05-17 NOTE — Telephone Encounter (Signed)
No request seen, will forward to dr/ pharm md.

## 2013-05-17 NOTE — Telephone Encounter (Signed)
Patient may hold coumadin for 5 days prior to colonoscopy.

## 2013-05-19 NOTE — Telephone Encounter (Signed)
Taken to MR to be faxed. 

## 2013-05-25 ENCOUNTER — Telehealth: Payer: Self-pay | Admitting: Cardiovascular Disease

## 2013-05-25 NOTE — Telephone Encounter (Signed)
Spoke with pt.  Rescheduled INR check for 1 week after colonoscopy.

## 2013-05-25 NOTE — Telephone Encounter (Signed)
New message   Having colonoscopy on 10-21.  Stop coumadin on 10-16 and restart it on 10-21.  When do you want her to reck her coumadin?

## 2013-06-06 ENCOUNTER — Other Ambulatory Visit: Payer: Self-pay | Admitting: Cardiovascular Disease

## 2013-06-17 ENCOUNTER — Other Ambulatory Visit: Payer: Self-pay

## 2013-06-17 ENCOUNTER — Ambulatory Visit (INDEPENDENT_AMBULATORY_CARE_PROVIDER_SITE_OTHER): Payer: Medicare Other | Admitting: *Deleted

## 2013-06-17 DIAGNOSIS — Z7901 Long term (current) use of anticoagulants: Secondary | ICD-10-CM

## 2013-06-17 DIAGNOSIS — I4891 Unspecified atrial fibrillation: Secondary | ICD-10-CM

## 2013-06-17 LAB — POCT INR: INR: 1.7

## 2013-06-28 ENCOUNTER — Ambulatory Visit (HOSPITAL_COMMUNITY)
Admission: RE | Admit: 2013-06-28 | Discharge: 2013-06-28 | Disposition: A | Discharge status: Home / Self Care | Payer: Medicare Other | Source: Ambulatory Visit | Attending: Internal Medicine | Admitting: Internal Medicine

## 2013-06-28 DIAGNOSIS — Z1231 Encounter for screening mammogram for malignant neoplasm of breast: Secondary | ICD-10-CM

## 2013-06-29 ENCOUNTER — Encounter: Payer: Self-pay | Admitting: Cardiovascular Disease

## 2013-07-02 ENCOUNTER — Ambulatory Visit (INDEPENDENT_AMBULATORY_CARE_PROVIDER_SITE_OTHER): Payer: Medicare Other | Admitting: General Practice

## 2013-07-02 DIAGNOSIS — I4891 Unspecified atrial fibrillation: Secondary | ICD-10-CM

## 2013-07-02 DIAGNOSIS — Z7901 Long term (current) use of anticoagulants: Secondary | ICD-10-CM

## 2013-07-02 LAB — POCT INR: INR: 2.3

## 2013-07-06 ENCOUNTER — Telehealth: Payer: Self-pay | Admitting: *Deleted

## 2013-07-06 NOTE — Telephone Encounter (Signed)
Pt called stating that she had been to Doctor and was ordered Nitrofurantoin  100 mg bid for UTI  and that she wanted to check before she began to take this medication . Pt informed that this medication had no interaction with coumadin . Pt states understanding

## 2013-07-22 ENCOUNTER — Other Ambulatory Visit: Payer: Self-pay | Admitting: Cardiovascular Disease

## 2013-07-23 ENCOUNTER — Ambulatory Visit (INDEPENDENT_AMBULATORY_CARE_PROVIDER_SITE_OTHER): Payer: Medicare Other | Admitting: *Deleted

## 2013-07-23 DIAGNOSIS — Z7901 Long term (current) use of anticoagulants: Secondary | ICD-10-CM

## 2013-07-23 DIAGNOSIS — I4891 Unspecified atrial fibrillation: Secondary | ICD-10-CM

## 2013-07-23 LAB — POCT INR: INR: 1.7

## 2013-08-10 ENCOUNTER — Ambulatory Visit (INDEPENDENT_AMBULATORY_CARE_PROVIDER_SITE_OTHER): Payer: Medicare Other | Admitting: Internal Medicine

## 2013-08-10 VITALS — BP 132/82 | HR 66 | Temp 99.8°F | Resp 17 | Ht 64.0 in | Wt 244.0 lb

## 2013-08-10 DIAGNOSIS — J039 Acute tonsillitis, unspecified: Secondary | ICD-10-CM

## 2013-08-10 DIAGNOSIS — R05 Cough: Secondary | ICD-10-CM

## 2013-08-10 DIAGNOSIS — J020 Streptococcal pharyngitis: Secondary | ICD-10-CM

## 2013-08-10 LAB — POCT RAPID STREP A (OFFICE): Rapid Strep A Screen: POSITIVE — AB

## 2013-08-10 MED ORDER — PENICILLIN G BENZATHINE 1200000 UNIT/2ML IM SUSP
1.2000 10*6.[IU] | Freq: Once | INTRAMUSCULAR | Status: AC
  Administered 2013-08-10: 1.2 10*6.[IU] via INTRAMUSCULAR

## 2013-08-10 NOTE — Progress Notes (Signed)
This chart was scribed for Tammy Sia, MD by Joaquin Music, ED Scribe. This patient was seen in room Room/bed 10 and the patient's care was started at 12:14 PM. Subjective:    Patient ID: Tammy Robbins, female    DOB: Jul 16, 1946, 67 y.o.   MRN: [id number] Chief Complaint  Patient presents with  . Headache  . URI  . Eye Pain  . Cough  . Chest Pain  . Generalized Body Aches   Headache  Associated symptoms include coughing, eye pain, a fever, rhinorrhea and a sore throat.  URI  Associated symptoms include chest pain, congestion, coughing, headaches, rhinorrhea and a sore throat.  Eye Pain  Associated symptoms include a fever.  Cough Associated symptoms include chest pain, chills, a fever, headaches, myalgias, rhinorrhea and a sore throat.  Chest Pain  Associated symptoms include a cough, diaphoresis, a fever and headaches.   Tammy Robbins is a 67 y.o. female who presents to the [facility] complaining of ongoing HA, cough with CP, eye pain, and generalized body aches with associated subjective fever, chills, and sore throat that began 4 days ago. Pt states she has been having a dry cough that has been causing her to have CP and chest tightness. She states she has been unable to get adequate amount of sleep due to current symptoms and waking up diaphoretic. Pt states she has had the flu shot this season. Pt denies any recent sick contacts Pt denies emesis and diarrhea.    Patient Active Problem List   Diagnosis Date Noted  . Chest tightness   . HTN (hypertension)   . Hyperlipemia   . Atrial fibrillation   . Mild aortic stenosis   . Diastolic dysfunction   . Mild tricuspid regurgitation   . Anxiety    History   Social History  . Marital Status: Significant Other    Spouse Name: N/A    Number of Children: N/A  . Years of Education: N/A   Social History Main Topics  . Smoking status: Never Smoker   . Smokeless tobacco: None  . Alcohol Use: Yes     Comment:  occassionally  . Drug Use: No  . Sexual Activity: No   Other Topics Concern  . None   Social History Narrative  . None   Past Surgical History  Procedure Laterality Date  . Cardiac catheterization      NORMAL  . Cataract extraction    . Total knee arthroplasty      LEFT  . Partial hysterectomy    . Bunionectomy    . Cholecystectomy    . Abdominal hysterectomy    . Eye surgery    . Joint replacement     Family History  Problem Relation Age of Onset  . Intracerebral hemorrhage Father   . Hypertension Father   . Pneumonia Mother   . Lung cancer Brother   . Mental retardation Sister    Current outpatient prescriptions:allopurinol (ZYLOPRIM) 300 MG tablet, Take 300 mg by mouth daily., Disp: , Rfl: ;  carvedilol (COREG) 25 MG tablet, Take 1 tablet (25 mg total) by mouth 2 (two) times daily., Disp: 180 tablet, Rfl: 4;  cephALEXin (KEFLEX) 250 MG capsule, Take 250 mg by mouth 3 (three) times daily. On hold as of 03-10-13, Disp: , Rfl:  Clidinium-Chlordiazepoxide (LIBRAX PO), Take by mouth 2 (two) times daily as needed. , Disp: , Rfl: ;  digoxin (LANOXIN) 0.25 MG tablet, Take 1 tablet (250 mcg total) by mouth daily.,  Disp: 90 tablet, Rfl: 3;  estradiol (ESTRACE) 0.1 MG/GM vaginal cream, Place 2 g vaginally as needed. , Disp: , Rfl: ;  fish oil-omega-3 fatty acids 1000 MG capsule, Take by mouth daily.  , Disp: , Rfl:  hydrochlorothiazide (HYDRODIURIL) 25 MG tablet, Take 1 tablet (25 mg total) by mouth daily., Disp: 90 tablet, Rfl: 3;  HYDROcodone-acetaminophen (NORCO) 10-325 MG per tablet, Take 1 tablet by mouth every 6 (six) hours as needed for pain. Order by Dr Su Hilt, Disp: , Rfl: ;  levothyroxine (SYNTHROID, LEVOTHROID) 50 MCG tablet, Take 50 mcg by mouth daily.  , Disp: , Rfl:  meclizine (ANTIVERT) 12.5 MG tablet, Take 12.5 mg by mouth 3 (three) times daily as needed for dizziness., Disp: , Rfl: ;  Multiple Vitamin (MULTIVITAMIN) tablet, Take 1 tablet by mouth daily.  , Disp: , Rfl: ;   nitroGLYCERIN (NITROSTAT) 0.4 MG SL tablet, Place 0.4 mg under the tongue every 5 (five) minutes as needed.  , Disp: , Rfl: ;  omeprazole (PRILOSEC) 20 MG capsule, Take 20 mg by mouth daily.  , Disp: , Rfl:  potassium chloride (K-DUR) 10 MEQ tablet, Take 1 tablet (10 mEq total) by mouth 2 (two) times daily., Disp: 180 tablet, Rfl: 3;  simvastatin (ZOCOR) 20 MG tablet, Take 20 mg by mouth at bedtime.  , Disp: , Rfl: ;  warfarin (COUMADIN) 5 MG tablet, TAKE 1 TABLET BY MOUTH EVERY DAY, Disp: 30 tablet, Rfl: 1  Review of Systems  Constitutional: Positive for fever, chills and diaphoresis.  HENT: Positive for congestion, rhinorrhea and sore throat.   Eyes: Positive for pain.  Respiratory: Positive for cough.   Cardiovascular: Positive for chest pain.  Musculoskeletal: Positive for myalgias.  Neurological: Positive for headaches.    Objective:   Physical Exam CONSTITUTIONAL: Well developed/well nourished HEAD: Normocephalic/atraumatic EYES: EOMI/PERRL ENMT: Mucous membranes moist/nose clear/throat red with exudate and strawberry lesions NECK: supple no meningeal signs but anterior cervical nodes are tender SPINE:entire spine nontender CV: S1/S2 noted, no murmurs/rubs/gallops noted LUNGS: Lungs are clear to auscultation bilaterally, no apparent distress   Results for orders placed in visit on 08/10/13  POCT RAPID STREP A (OFFICE)      Result Value Range   Rapid Strep A Screen Positive (*) Negative    BP 132/82  Pulse 66  Temp(Src) 99.8 F (37.7 C) (Oral)  Resp 17  Ht 5\' 4"  (1.626 m)  Wt 244 lb (110.678 kg)  BMI 41.86 kg/m2  SpO2 96% Assessment & Plan:   Acute tonsillitis - Plan: POCT rapid strep A  Cough - Plan: POCT rapid strep A  Bicillin L-A 1.2 mil units   I personally performed the services described in this documentation, which was scribed in my presence. The recorded information has been reviewed and is accurate.

## 2013-08-11 ENCOUNTER — Ambulatory Visit (INDEPENDENT_AMBULATORY_CARE_PROVIDER_SITE_OTHER): Payer: Medicare Other | Admitting: *Deleted

## 2013-08-11 DIAGNOSIS — Z7901 Long term (current) use of anticoagulants: Secondary | ICD-10-CM

## 2013-08-11 DIAGNOSIS — I4891 Unspecified atrial fibrillation: Secondary | ICD-10-CM

## 2013-08-11 LAB — POCT INR: INR: 2.6

## 2013-09-08 ENCOUNTER — Telehealth: Payer: Self-pay | Admitting: Cardiovascular Disease

## 2013-09-08 ENCOUNTER — Encounter: Payer: Self-pay | Admitting: Cardiovascular Disease

## 2013-09-08 ENCOUNTER — Ambulatory Visit (INDEPENDENT_AMBULATORY_CARE_PROVIDER_SITE_OTHER): Payer: Medicare Other | Admitting: Cardiovascular Disease

## 2013-09-08 ENCOUNTER — Ambulatory Visit (INDEPENDENT_AMBULATORY_CARE_PROVIDER_SITE_OTHER): Payer: Medicare Other | Admitting: *Deleted

## 2013-09-08 ENCOUNTER — Telehealth: Payer: Self-pay | Admitting: *Deleted

## 2013-09-08 VITALS — BP 150/81 | HR 59 | Ht 64.0 in | Wt 239.8 lb

## 2013-09-08 DIAGNOSIS — Z7901 Long term (current) use of anticoagulants: Secondary | ICD-10-CM

## 2013-09-08 DIAGNOSIS — I10 Essential (primary) hypertension: Secondary | ICD-10-CM

## 2013-09-08 DIAGNOSIS — Z5181 Encounter for therapeutic drug level monitoring: Secondary | ICD-10-CM | POA: Insufficient documentation

## 2013-09-08 DIAGNOSIS — I4891 Unspecified atrial fibrillation: Secondary | ICD-10-CM

## 2013-09-08 LAB — POCT INR: INR: 2.8

## 2013-09-08 LAB — BASIC METABOLIC PANEL
BUN: 14 mg/dL (ref 6–23)
CALCIUM: 9.2 mg/dL (ref 8.4–10.5)
CHLORIDE: 106 meq/L (ref 96–112)
CO2: 28 meq/L (ref 19–32)
CREATININE: 0.8 mg/dL (ref 0.4–1.2)
GFR: 82.04 mL/min (ref >60.00)
GLUCOSE: 112 mg/dL — AB (ref 70–99)
Potassium: 3.5 mEq/L (ref 3.5–5.1)
Sodium: 140 mEq/L (ref 135–145)

## 2013-09-08 MED ORDER — DIGOXIN 125 MCG PO TABS: 125.0000 ug | ORAL_TABLET | Freq: Every day | ORAL | Status: DC

## 2013-09-08 NOTE — Progress Notes (Signed)
Tammy Robbins Date of Birth  24-Apr-1946 [facility]     [place] Office  [address]    [address]   [address] [place], [place]  [address]    [place], [place]  [address] [phone]  Fax  [phone]  [phone]  Fax [phone]  Problems: 1. Atrial fibrillation 3. Hypertension 3. Hyperlipidemia 4. History chest pain-smooth and normal coronary arteries by cardiac catheterization   5. Hypothyroidism 6. Arthritis 7. Aortic stenosis - mild  8. obesity  History of Present Illness:  Tammy Robbins is a 68 year old female with a history of atrial fibrillation. She's been on chronic Coumadin therapy. She's had a cardiac catheterization in the past which revealed smooth and normal coronary arteries. She also has a history of hypertension and hyperlipidemia. She has mild aortic stenosis. She's done well from a cardiac standpoint. She has not had any episodes of chest pain. She has had an upper respiratory tract infection since Thanksgiving.  September 10, 2102: Tammy Robbins has a history of atrial fibrillation. She's been on chronic Coumadin therapy. Her INR levels here.  She's been having some problems with arthritis recently. She denies any cardiac problems. She denies any chest pain or shortness breath or palpitations. She remains active but is not getting any regular exercise.  March 10, 2013:  Tammy Robbins is doing well. She is doing low impact exercises.    No CP or dyspnea.    Jan. 28, 2015:  Tammy Robbins is doing well.  Stays active.  Exercises regularly. No symptoms.   iNR levels are ok.  Did not take BP meds this am.  Cholesterol jis managed by Dr. Su Hilt.      Current Outpatient Prescriptions on File Prior to Visit  Medication Sig Dispense Refill  . allopurinol (ZYLOPRIM) 300 MG tablet Take 300 mg by mouth daily.      . carvedilol (COREG) 25 MG tablet Take 1 tablet (25 mg total) by mouth 2 (two) times daily.  180 tablet  4  . Clidinium-Chlordiazepoxide (LIBRAX PO) Take by  mouth 2 (two) times daily as needed.       . digoxin (LANOXIN) 0.25 MG tablet Take 1 tablet (250 mcg total) by mouth daily.  90 tablet  3  . estradiol (ESTRACE) 0.1 MG/GM vaginal cream Place 2 g vaginally as needed.       . fish oil-omega-3 fatty acids 1000 MG capsule Take by mouth daily.        . hydrochlorothiazide (HYDRODIURIL) 25 MG tablet Take 1 tablet (25 mg total) by mouth daily.  90 tablet  3  . HYDROcodone-acetaminophen (NORCO) 10-325 MG per tablet Take 1 tablet by mouth every 6 (six) hours as needed for pain. Order by Dr Su Hilt      . levothyroxine (SYNTHROID, LEVOTHROID) 50 MCG tablet Take 50 mcg by mouth daily.        . meclizine (ANTIVERT) 12.5 MG tablet Take 12.5 mg by mouth 3 (three) times daily as needed for dizziness.      . Multiple Vitamin (MULTIVITAMIN) tablet Take 1 tablet by mouth daily.        . nitroGLYCERIN (NITROSTAT) 0.4 MG SL tablet Place 0.4 mg under the tongue every 5 (five) minutes as needed.        Marland Kitchen omeprazole (PRILOSEC) 20 MG capsule Take 20 mg by mouth daily.        . potassium chloride (K-DUR) 10 MEQ tablet Take 1 tablet (10 mEq total) by mouth 2 (two) times daily.  180 tablet  3  . simvastatin (ZOCOR) 20 MG tablet Take 20 mg by mouth at bedtime.        Marland Kitchen. warfarin (COUMADIN) 5 MG tablet TAKE 1 TABLET BY MOUTH EVERY DAY  30 tablet  1   No current facility-administered medications on file prior to visit.    Allergies  Allergen Reactions  . Diflucan [Fluconazole] Rash    Past Medical History  Diagnosis Date  . Chest tightness   . HTN (hypertension)   . Hyperlipemia   . Atrial fibrillation   . Mild aortic stenosis   . Diastolic dysfunction   . Mild tricuspid regurgitation   . Anxiety     Past Surgical History  Procedure Laterality Date  . Cardiac catheterization      NORMAL  . Cataract extraction    . Total knee arthroplasty      LEFT  . Partial hysterectomy    . Bunionectomy    . Cholecystectomy    . Abdominal hysterectomy    . Eye  surgery    . Joint replacement      History  Smoking status  . Never Smoker   Smokeless tobacco  . Not on file    History  Alcohol Use  . Yes    Comment: occassionally    Family History  Problem Relation Age of Onset  . Intracerebral hemorrhage Father   . Hypertension Father   . Pneumonia Mother   . Lung cancer Brother   . Mental retardation Sister     Reviw of Systems:  Reviewed in the HPI.  All other systems are negative.  Physical Exam: BP 150/81  Pulse 59  Ht 5\' 4"  (1.626 m)  Wt 239 lb 12.8 oz (108.773 kg)  BMI 41.14 kg/m2 The patient is alert and oriented x 3.  The mood and affect are normal.   Skin: warm and dry.  Color is normal.    HEENT:   Normocephalic/atraumatic. Normal carotids. Mucous membranes are moist. Neck is supple.  Lungs: Lungs are clear   Heart: Irregularly irregular. She has a very soft systolic murmur.    Abdomen: Good bowel sounds. Her abdomen is nontender. There is no hepatosplenomegaly.  Extremities:  No clubbing cyanosis or edema.  Neuro:  Exam is nonfocal.    ECG: September 08, 2013:  Atrial fibrillation at 5361. She has no ST or T wave changes  Assessment / Plan:

## 2013-09-08 NOTE — Assessment & Plan Note (Addendum)
Her blood pressures typically been well controlled. She did not take her blood pressure medicines today which explains why her blood pressure is a little elevated today. Her medicines tend to cause an upset stomach if she takes them on an empty stomach.  Will check BMP today since she is on HCTZ and K.

## 2013-09-08 NOTE — Telephone Encounter (Signed)
re clarified dose of digoxin.

## 2013-09-08 NOTE — Assessment & Plan Note (Signed)
[place] is doing very well. She remains in atrial fibrillation. She is completely asymptomatic. We will continue with her current medications. We'll check an INR today. The changes in her medical therapy. I'll see her again in one year for office visit and EKG.

## 2013-09-08 NOTE — Patient Instructions (Addendum)
Your physician wants you to follow-up in: 1 year  You will receive a reminder letter in the mail two months in advance. If you don't receive a letter, please call our office to schedule the follow-up appointment.   Your physician recommends that you continue on your current medications as directed. Please refer to the Current Medication list given to you today.  Your physician recommends that you return for lab work in: bmet

## 2013-09-08 NOTE — Telephone Encounter (Signed)
New problem   Pt need to speak to you concerning her medication. Please call pt

## 2013-09-08 NOTE — Telephone Encounter (Signed)
Per medication review/ Dr Elease HashimotoNahser will educe digoxin to 0.125 mg daily. Pt was informed and med was changed.

## 2013-09-14 ENCOUNTER — Telehealth: Payer: Self-pay | Admitting: *Deleted

## 2013-09-14 ENCOUNTER — Telehealth: Payer: Self-pay | Admitting: Cardiovascular Disease

## 2013-09-14 NOTE — Telephone Encounter (Signed)
Patient is returning your call from yesterday, please call on cell phone. [place] that [place] why she wasn't available yesterday. Please call back.

## 2013-09-14 NOTE — Telephone Encounter (Signed)
[facility] a prior authorization is not need for the digoxin and they have paid a claim for January 2015.

## 2013-09-14 NOTE — Telephone Encounter (Signed)
Pt was called with results

## 2013-09-30 ENCOUNTER — Other Ambulatory Visit: Payer: Self-pay | Admitting: *Deleted

## 2013-09-30 DIAGNOSIS — I10 Essential (primary) hypertension: Secondary | ICD-10-CM

## 2013-09-30 MED ORDER — HYDROCHLOROTHIAZIDE 25 MG PO TABS: 25.0000 mg | ORAL_TABLET | Freq: Every day | ORAL | Status: DC

## 2013-09-30 MED ORDER — POTASSIUM CHLORIDE ER 10 MEQ PO TBCR: 10.0000 meq | EXTENDED_RELEASE_TABLET | Freq: Two times a day (BID) | ORAL | Status: DC

## 2013-10-13 ENCOUNTER — Ambulatory Visit (INDEPENDENT_AMBULATORY_CARE_PROVIDER_SITE_OTHER): Payer: Medicare Other | Admitting: Pharmacist

## 2013-10-13 DIAGNOSIS — Z5181 Encounter for therapeutic drug level monitoring: Secondary | ICD-10-CM

## 2013-10-13 DIAGNOSIS — Z7901 Long term (current) use of anticoagulants: Secondary | ICD-10-CM

## 2013-10-13 DIAGNOSIS — I4891 Unspecified atrial fibrillation: Secondary | ICD-10-CM

## 2013-10-13 LAB — POCT INR: INR: 2.7

## 2013-10-25 ENCOUNTER — Other Ambulatory Visit: Payer: Self-pay | Admitting: Cardiovascular Disease

## 2013-11-17 ENCOUNTER — Ambulatory Visit (INDEPENDENT_AMBULATORY_CARE_PROVIDER_SITE_OTHER): Payer: Medicare Other | Admitting: *Deleted

## 2013-11-17 DIAGNOSIS — I4891 Unspecified atrial fibrillation: Secondary | ICD-10-CM

## 2013-11-17 DIAGNOSIS — Z5181 Encounter for therapeutic drug level monitoring: Secondary | ICD-10-CM

## 2013-11-17 DIAGNOSIS — Z7901 Long term (current) use of anticoagulants: Secondary | ICD-10-CM

## 2013-11-17 LAB — POCT INR: INR: 2.3

## 2013-12-02 ENCOUNTER — Telehealth: Payer: Self-pay | Admitting: Cardiovascular Disease

## 2013-12-02 NOTE — Telephone Encounter (Signed)
Called spoke with pt, states she is taking Cephalexin 250mg  QID x 10 days.  Advised pt this antibiotic does not interact with Coumadin, no dosage adjustment or sooner check necessary.

## 2013-12-02 NOTE — Telephone Encounter (Signed)
New message  Patient has bronchitis and is taking Cephalexin 250mg  and wanted to see if she needed to do anything different with her coumadin? Please call and advise.

## 2013-12-29 ENCOUNTER — Ambulatory Visit (INDEPENDENT_AMBULATORY_CARE_PROVIDER_SITE_OTHER): Payer: Medicare Other | Admitting: *Deleted

## 2013-12-29 DIAGNOSIS — Z7901 Long term (current) use of anticoagulants: Secondary | ICD-10-CM

## 2013-12-29 DIAGNOSIS — I4891 Unspecified atrial fibrillation: Secondary | ICD-10-CM

## 2013-12-29 DIAGNOSIS — Z5181 Encounter for therapeutic drug level monitoring: Secondary | ICD-10-CM

## 2013-12-29 LAB — POCT INR: INR: 2.8

## 2014-02-03 ENCOUNTER — Other Ambulatory Visit: Payer: Self-pay | Admitting: Cardiovascular Disease

## 2014-02-04 ENCOUNTER — Telehealth: Payer: Self-pay | Admitting: Cardiovascular Disease

## 2014-02-04 NOTE — Telephone Encounter (Signed)
Returned call to pt, started on Cipro 250mg  BID x 5 days will complete on 02/08/14.  Advised pt this is low dose Cipro, and even though Cipro can increase the effects of Coumadin and cause the INR to become elevated, at this dosage it is usually less of an effect. Advised pt ok to start abx, no dosage adjustment needed, keep scheduled f/u appt in clinic for 02/09/14. Pt verbalized understanding.

## 2014-02-04 NOTE — Telephone Encounter (Signed)
Patient is now taking Ciproflacxin 250mg  for an infection, please call and advise regarding her coumadin.

## 2014-02-09 ENCOUNTER — Ambulatory Visit (INDEPENDENT_AMBULATORY_CARE_PROVIDER_SITE_OTHER): Payer: Medicare Other | Admitting: *Deleted

## 2014-02-09 DIAGNOSIS — I4891 Unspecified atrial fibrillation: Secondary | ICD-10-CM

## 2014-02-09 DIAGNOSIS — Z5181 Encounter for therapeutic drug level monitoring: Secondary | ICD-10-CM

## 2014-02-09 DIAGNOSIS — Z7901 Long term (current) use of anticoagulants: Secondary | ICD-10-CM

## 2014-02-09 LAB — POCT INR: INR: 2.5

## 2014-03-06 ENCOUNTER — Other Ambulatory Visit: Payer: Self-pay | Admitting: Cardiovascular Disease

## 2014-03-23 ENCOUNTER — Ambulatory Visit (INDEPENDENT_AMBULATORY_CARE_PROVIDER_SITE_OTHER): Payer: Medicare Other | Admitting: *Deleted

## 2014-03-23 DIAGNOSIS — I4891 Unspecified atrial fibrillation: Secondary | ICD-10-CM

## 2014-03-23 DIAGNOSIS — Z7901 Long term (current) use of anticoagulants: Secondary | ICD-10-CM

## 2014-03-23 DIAGNOSIS — Z5181 Encounter for therapeutic drug level monitoring: Secondary | ICD-10-CM

## 2014-03-23 LAB — POCT INR: INR: 2.6

## 2014-05-04 ENCOUNTER — Ambulatory Visit (INDEPENDENT_AMBULATORY_CARE_PROVIDER_SITE_OTHER): Payer: Medicare Other | Admitting: *Deleted

## 2014-05-04 DIAGNOSIS — Z7901 Long term (current) use of anticoagulants: Secondary | ICD-10-CM

## 2014-05-04 DIAGNOSIS — I4891 Unspecified atrial fibrillation: Secondary | ICD-10-CM

## 2014-05-04 DIAGNOSIS — Z5181 Encounter for therapeutic drug level monitoring: Secondary | ICD-10-CM

## 2014-05-04 LAB — POCT INR: INR: 2.5

## 2014-05-30 ENCOUNTER — Other Ambulatory Visit (HOSPITAL_COMMUNITY): Payer: Self-pay | Admitting: Internal Medicine

## 2014-05-30 DIAGNOSIS — Z1231 Encounter for screening mammogram for malignant neoplasm of breast: Secondary | ICD-10-CM

## 2014-06-15 ENCOUNTER — Ambulatory Visit (INDEPENDENT_AMBULATORY_CARE_PROVIDER_SITE_OTHER): Payer: Medicare Other | Admitting: *Deleted

## 2014-06-15 DIAGNOSIS — Z5181 Encounter for therapeutic drug level monitoring: Secondary | ICD-10-CM

## 2014-06-15 DIAGNOSIS — Z7901 Long term (current) use of anticoagulants: Secondary | ICD-10-CM

## 2014-06-15 DIAGNOSIS — I4891 Unspecified atrial fibrillation: Secondary | ICD-10-CM

## 2014-06-15 LAB — POCT INR: INR: 2.7

## 2014-06-29 ENCOUNTER — Ambulatory Visit (HOSPITAL_COMMUNITY)
Admission: RE | Admit: 2014-06-29 | Discharge: 2014-06-29 | Disposition: A | Discharge status: Home / Self Care | Payer: Medicare Other | Source: Ambulatory Visit | Attending: Internal Medicine | Admitting: Internal Medicine

## 2014-06-29 DIAGNOSIS — Z1231 Encounter for screening mammogram for malignant neoplasm of breast: Secondary | ICD-10-CM | POA: Insufficient documentation

## 2014-07-25 ENCOUNTER — Other Ambulatory Visit: Payer: Self-pay | Admitting: Cardiovascular Disease

## 2014-07-27 ENCOUNTER — Ambulatory Visit (INDEPENDENT_AMBULATORY_CARE_PROVIDER_SITE_OTHER): Payer: Medicare Other | Admitting: *Deleted

## 2014-07-27 DIAGNOSIS — I4891 Unspecified atrial fibrillation: Secondary | ICD-10-CM

## 2014-07-27 DIAGNOSIS — Z5181 Encounter for therapeutic drug level monitoring: Secondary | ICD-10-CM

## 2014-07-27 DIAGNOSIS — Z7901 Long term (current) use of anticoagulants: Secondary | ICD-10-CM

## 2014-07-27 LAB — POCT INR: INR: 2.6

## 2014-07-29 ENCOUNTER — Telehealth: Payer: Self-pay | Admitting: *Deleted

## 2014-07-29 DIAGNOSIS — I359 Nonrheumatic aortic valve disorder, unspecified: Secondary | ICD-10-CM | POA: Insufficient documentation

## 2014-08-02 NOTE — Telephone Encounter (Signed)
Patient aware that meds do not interfer with Coumadin

## 2014-08-18 ENCOUNTER — Encounter: Payer: Self-pay | Admitting: Cardiovascular Disease

## 2014-08-18 ENCOUNTER — Ambulatory Visit (INDEPENDENT_AMBULATORY_CARE_PROVIDER_SITE_OTHER): Payer: Medicare Other | Admitting: Cardiovascular Disease

## 2014-08-18 VITALS — BP 132/84 | HR 73 | Ht 64.0 in | Wt 250.4 lb

## 2014-08-18 DIAGNOSIS — I482 Chronic atrial fibrillation, unspecified: Secondary | ICD-10-CM

## 2014-08-18 DIAGNOSIS — R0789 Other chest pain: Secondary | ICD-10-CM

## 2014-08-18 DIAGNOSIS — E785 Hyperlipidemia, unspecified: Secondary | ICD-10-CM

## 2014-08-18 LAB — LIPID PANEL
CHOL/HDL RATIO: 4
Cholesterol: 132 mg/dL (ref 0–200)
HDL: 35.7 mg/dL — ABNORMAL LOW (ref >39.00)
LDL CALC: 70 mg/dL (ref 0–99)
NonHDL: 96.3
TRIGLYCERIDES: 132 mg/dL (ref 0.0–149.0)
VLDL: 26.4 mg/dL (ref 0.0–40.0)

## 2014-08-18 LAB — HEPATIC FUNCTION PANEL
ALK PHOS: 55 U/L (ref 39–117)
ALT: 19 U/L (ref 0–35)
AST: 19 U/L (ref 0–37)
Albumin: 3.7 g/dL (ref 3.5–5.2)
Bilirubin, Direct: 0 mg/dL (ref 0.0–0.3)
Total Bilirubin: 0.5 mg/dL (ref 0.2–1.2)
Total Protein: 6.8 g/dL (ref 6.0–8.3)

## 2014-08-18 LAB — BASIC METABOLIC PANEL
BUN: 15 mg/dL (ref 6–23)
CALCIUM: 9.1 mg/dL (ref 8.4–10.5)
CHLORIDE: 107 meq/L (ref 96–112)
CO2: 27 meq/L (ref 19–32)
Creatinine, Ser: 0.9 mg/dL (ref 0.4–1.2)
GFR: 68.03 mL/min (ref >60.00)
GLUCOSE: 100 mg/dL — AB (ref 70–99)
Potassium: 4 mEq/L (ref 3.5–5.1)
Sodium: 141 mEq/L (ref 135–145)

## 2014-08-18 MED ORDER — NITROGLYCERIN 0.4 MG SL SUBL
0.4000 mg | SUBLINGUAL_TABLET | SUBLINGUAL | Status: DC | PRN
Start: 2014-08-18 — End: 2024-07-06

## 2014-08-18 NOTE — Progress Notes (Signed)
Tammy Robbins Date of Birth  [birth year] [facility]     [place] Office  [address]    [address]   [address] [place], [place]  [address]    [place], [place]  [address] [phone]  Fax  [phone]  [phone]  Fax [phone]  Problems: 1. Atrial fibrillation 3. Hypertension 3. Hyperlipidemia 4. History chest pain-smooth and normal coronary arteries by cardiac catheterization   5. Hypothyroidism 6. Arthritis 7. Aortic stenosis - mild  8. obesity  History of Present Illness:  Tammy Robbins is a 69 year old female with a history of atrial fibrillation. She's been on chronic Coumadin therapy. She's had a cardiac catheterization in the past which revealed smooth and normal coronary arteries. She also has a history of hypertension and hyperlipidemia. She has mild aortic stenosis. She's done well from a cardiac standpoint. She has not had any episodes of chest pain. She has had an upper respiratory tract infection since Thanksgiving.  September 10, 2102: Tammy Robbins has a history of atrial fibrillation. She's been on chronic Coumadin therapy. Her INR levels here.  She's been having some problems with arthritis recently. She denies any cardiac problems. She denies any chest pain or shortness breath or palpitations. She remains active but is not getting any regular exercise.  March 10, 2013:  Tammy Robbins is doing well. She is doing low impact exercises.    No CP or dyspnea.    Jan. 28, 2015:  Tammy Robbins is doing well.  Stays active.  Exercises regularly. No symptoms.   iNR levels are ok.  Did not take BP meds this am.  Cholesterol is managed by Dr. Su Hilt.    Jan. 7, 2016:  Tammy Robbins is a 69 year old female with a history of atrial fibrillation. She's been on chronic Coumadin therapy She has episodes of chest pain on a very rare basis.  Had a cath which showed normal coronaries.   Her NTG has expired. Has recurrent UTIs, seeing a urologist.    Current Outpatient Prescriptions  on File Prior to Visit  Medication Sig Dispense Refill  . allopurinol (ZYLOPRIM) 300 MG tablet Take 300 mg by mouth daily.    . carvedilol (COREG) 25 MG tablet TAKE 1 TABLET BY MOUTH TWICE DAILY 180 tablet 1  . Clidinium-Chlordiazepoxide (LIBRAX PO) Take by mouth 2 (two) times daily as needed.     . digoxin (LANOXIN) 0.125 MG tablet Take 1 tablet (125 mcg total) by mouth daily. 90 tablet 3  . estradiol (ESTRACE) 0.1 MG/GM vaginal cream Place 2 g vaginally as needed.     . fish oil-omega-3 fatty acids 1000 MG capsule Take by mouth daily.      . fluconazole (DIFLUCAN) 150 MG tablet Take 1/2 tablet once monthly    . hydrochlorothiazide (HYDRODIURIL) 25 MG tablet Take 1 tablet (25 mg total) by mouth daily. 90 tablet 3  . levothyroxine (SYNTHROID, LEVOTHROID) 50 MCG tablet Take 50 mcg by mouth daily.      . meclizine (ANTIVERT) 12.5 MG tablet Take 12.5 mg by mouth 3 (three) times daily as needed for dizziness.    . Multiple Vitamin (MULTIVITAMIN) tablet Take 1 tablet by mouth daily.      . nitroGLYCERIN (NITROSTAT) 0.4 MG SL tablet Place 0.4 mg under the tongue every 5 (five) minutes as needed.      Marland Kitchen omeprazole (PRILOSEC) 20 MG capsule Take 20 mg by mouth daily.      . potassium chloride (K-DUR) 10 MEQ tablet Take 1  tablet (10 mEq total) by mouth 2 (two) times daily. 180 tablet 3  . simvastatin (ZOCOR) 20 MG tablet Take 20 mg by mouth at bedtime.      . valACYclovir (VALTREX) 1000 MG tablet Take 1,000 mg by mouth 2 (two) times daily.    Marland Kitchen. warfarin (COUMADIN) 5 MG tablet Take as directed by anticoagulation clinic 30 tablet 3   No current facility-administered medications on file prior to visit.    Allergies  Allergen Reactions  . Diflucan [Fluconazole] Rash    Past Medical History  Diagnosis Date  . Chest tightness   . HTN (hypertension)   . Hyperlipemia   . Atrial fibrillation   . Mild aortic stenosis   . Diastolic dysfunction   . Mild tricuspid regurgitation   . Anxiety     Past  Surgical History  Procedure Laterality Date  . Cardiac catheterization      NORMAL  . Cataract extraction    . Total knee arthroplasty      LEFT  . Partial hysterectomy    . Bunionectomy    . Cholecystectomy    . Abdominal hysterectomy    . Eye surgery    . Joint replacement      History  Smoking status  . Never Smoker   Smokeless tobacco  . Not on file    History  Alcohol Use  . Yes    Comment: occassionally    Family History  Problem Relation Age of Onset  . Intracerebral hemorrhage Father   . Hypertension Father   . Pneumonia Mother   . Lung cancer Brother   . Mental retardation Sister     Reviw of Systems:  Reviewed in the HPI.  All other systems are negative.  Physical Exam: BP 132/84 mmHg  Pulse 73  Ht 5\' 4"  (1.626 m)  Wt 250 lb 6.4 oz (113.581 kg)  BMI 42.96 kg/m2 The patient is alert and oriented x 3.  The mood and affect are normal.   Skin: warm and dry.  Color is normal.    HEENT:   Normocephalic/atraumatic. Normal carotids. Mucous membranes are moist. Neck is supple.  Lungs: Lungs are clear   Heart: Irregularly irregular. She has a very soft systolic murmur.    Abdomen: Good bowel sounds. Her abdomen is nontender. There is no hepatosplenomegaly.  Extremities:  No clubbing cyanosis or edema.  Neuro:  Exam is nonfocal.    ECG: August 18, 2014:  Atrial fib at 3173.  No ST or T wave changes.   Assessment / Plan:

## 2014-08-18 NOTE — Assessment & Plan Note (Signed)
She has done well. No recent episodes of CP but she would like to renew her SL NTG.

## 2014-08-18 NOTE — Assessment & Plan Note (Signed)
Check fasting labs today. 

## 2014-08-18 NOTE — Assessment & Plan Note (Addendum)
Her Afib rate is stable. Continue current meds. Her last INR is therapeutic.

## 2014-08-18 NOTE — Patient Instructions (Signed)
Your physician recommends that you continue on your current medications as directed. Please refer to the Current Medication list given to you today.  Your physician recommends that you return for lab work today (BMET, Lipid Panel, Hepatic Panel)  Your physician wants you to follow-up in: 1 year with Dr. Nahser. You will receive a reminder letter in the mail two months in advance. If you don't receive a letter, please call our office to schedule the follow-up appointment.  

## 2014-09-05 ENCOUNTER — Other Ambulatory Visit: Payer: Self-pay | Admitting: Cardiovascular Disease

## 2014-09-06 ENCOUNTER — Other Ambulatory Visit: Payer: Self-pay | Admitting: *Deleted

## 2014-09-06 MED ORDER — DIGOXIN 125 MCG PO TABS: 125.0000 ug | ORAL_TABLET | Freq: Every day | ORAL | Status: DC

## 2014-09-07 ENCOUNTER — Ambulatory Visit (INDEPENDENT_AMBULATORY_CARE_PROVIDER_SITE_OTHER): Payer: Medicare Other | Admitting: *Deleted

## 2014-09-07 DIAGNOSIS — I482 Chronic atrial fibrillation, unspecified: Secondary | ICD-10-CM

## 2014-09-07 DIAGNOSIS — Z5181 Encounter for therapeutic drug level monitoring: Secondary | ICD-10-CM

## 2014-09-07 DIAGNOSIS — Z7901 Long term (current) use of anticoagulants: Secondary | ICD-10-CM

## 2014-09-07 DIAGNOSIS — I4891 Unspecified atrial fibrillation: Secondary | ICD-10-CM

## 2014-09-07 LAB — POCT INR: INR: 2.5

## 2014-09-15 ENCOUNTER — Other Ambulatory Visit: Payer: Self-pay | Admitting: Cardiovascular Disease

## 2014-09-20 ENCOUNTER — Telehealth: Payer: Self-pay

## 2014-09-20 NOTE — Telephone Encounter (Signed)
Pt called states she got a prescription for 6 day 5mg  Prednisone taper 6-5-4-3-2-1-off for a pulled hamstring.  Advised pt Prednisone can increase effects of Coumadin, while on higher doses of Prednisone increase vit K intake.  This is a quick, relatively low dose taper, keep scheduled f/u appt.  Monitor for increased bruising or bleeding call with problems for sooner appt.

## 2014-09-22 ENCOUNTER — Other Ambulatory Visit: Payer: Self-pay | Admitting: Cardiovascular Disease

## 2014-10-19 ENCOUNTER — Ambulatory Visit (INDEPENDENT_AMBULATORY_CARE_PROVIDER_SITE_OTHER): Payer: Medicare Other | Admitting: *Deleted

## 2014-10-19 DIAGNOSIS — Z5181 Encounter for therapeutic drug level monitoring: Secondary | ICD-10-CM

## 2014-10-19 DIAGNOSIS — I482 Chronic atrial fibrillation, unspecified: Secondary | ICD-10-CM

## 2014-10-19 DIAGNOSIS — I4891 Unspecified atrial fibrillation: Secondary | ICD-10-CM | POA: Diagnosis not present

## 2014-10-19 DIAGNOSIS — Z7901 Long term (current) use of anticoagulants: Secondary | ICD-10-CM | POA: Diagnosis not present

## 2014-10-19 LAB — POCT INR: INR: 3.1

## 2014-11-30 ENCOUNTER — Ambulatory Visit (INDEPENDENT_AMBULATORY_CARE_PROVIDER_SITE_OTHER): Payer: Medicare Other | Admitting: *Deleted

## 2014-11-30 DIAGNOSIS — I482 Chronic atrial fibrillation, unspecified: Secondary | ICD-10-CM

## 2014-11-30 DIAGNOSIS — Z5181 Encounter for therapeutic drug level monitoring: Secondary | ICD-10-CM

## 2014-11-30 DIAGNOSIS — I4891 Unspecified atrial fibrillation: Secondary | ICD-10-CM

## 2014-11-30 DIAGNOSIS — Z7901 Long term (current) use of anticoagulants: Secondary | ICD-10-CM

## 2014-11-30 LAB — POCT INR: INR: 2.9

## 2014-12-21 ENCOUNTER — Other Ambulatory Visit: Payer: Self-pay | Admitting: Adult Health

## 2014-12-23 NOTE — Telephone Encounter (Signed)
Per note 1.12.16  

## 2015-01-12 ENCOUNTER — Ambulatory Visit (INDEPENDENT_AMBULATORY_CARE_PROVIDER_SITE_OTHER): Payer: Medicare Other | Admitting: *Deleted

## 2015-01-12 DIAGNOSIS — I482 Chronic atrial fibrillation, unspecified: Secondary | ICD-10-CM

## 2015-01-12 DIAGNOSIS — Z5181 Encounter for therapeutic drug level monitoring: Secondary | ICD-10-CM

## 2015-01-12 DIAGNOSIS — I4891 Unspecified atrial fibrillation: Secondary | ICD-10-CM

## 2015-01-12 DIAGNOSIS — Z7901 Long term (current) use of anticoagulants: Secondary | ICD-10-CM

## 2015-01-12 LAB — POCT INR: INR: 2.9

## 2015-01-18 ENCOUNTER — Telehealth: Payer: Self-pay | Admitting: *Deleted

## 2015-01-18 NOTE — Telephone Encounter (Signed)
Pt called to inform us that she was started on Cipro 250mg s BID for 5 days, discussed with Pharm D since dose is only 250mg s pt does not need to come in sooner to check. Pt instructed to call if med changes or dose is increased.

## 2015-01-25 ENCOUNTER — Other Ambulatory Visit: Payer: Self-pay | Admitting: Cardiovascular Disease

## 2015-02-06 ENCOUNTER — Other Ambulatory Visit: Payer: Self-pay

## 2015-02-23 ENCOUNTER — Ambulatory Visit (INDEPENDENT_AMBULATORY_CARE_PROVIDER_SITE_OTHER): Payer: Medicare Other | Admitting: *Deleted

## 2015-02-23 DIAGNOSIS — I4891 Unspecified atrial fibrillation: Secondary | ICD-10-CM

## 2015-02-23 DIAGNOSIS — Z5181 Encounter for therapeutic drug level monitoring: Secondary | ICD-10-CM

## 2015-02-23 DIAGNOSIS — Z7901 Long term (current) use of anticoagulants: Secondary | ICD-10-CM | POA: Diagnosis not present

## 2015-02-23 DIAGNOSIS — I482 Chronic atrial fibrillation, unspecified: Secondary | ICD-10-CM

## 2015-02-23 LAB — POCT INR: INR: 2.6

## 2015-03-13 ENCOUNTER — Telehealth: Payer: Self-pay | Admitting: *Deleted

## 2015-03-13 NOTE — Telephone Encounter (Signed)
Pt called stating that she has been placed on Neo/Poly/Vex opth drops and wanted to know if any interaction between these eye drops and coumadin . Pt instructed that there is no interaction between the coumadin and these eye drops and to continue the coumadin as ordered and take the eye drops as ordered and she states understanding

## 2015-03-23 ENCOUNTER — Telehealth: Payer: Self-pay | Admitting: *Deleted

## 2015-03-23 NOTE — Telephone Encounter (Signed)
Patient called and stated that she has been prescribed Famciclovir  daily for an infection,herpes per patient, of the left eye.  She was prescribed 63 pills but her insurance would pay for 21 pills.  Therefore, she will be taking all 21 pills unless her doctor tells her other wise on 04/05/15 when she return for a follow up visit.  Advised that the medication is safe to take with Coumadin and she verbalized understanding and was advised to call back if she has any medication changes

## 2015-03-29 ENCOUNTER — Other Ambulatory Visit: Payer: Self-pay | Admitting: Adult Health

## 2015-04-06 ENCOUNTER — Ambulatory Visit (INDEPENDENT_AMBULATORY_CARE_PROVIDER_SITE_OTHER): Payer: Medicare Other | Admitting: *Deleted

## 2015-04-06 DIAGNOSIS — Z7901 Long term (current) use of anticoagulants: Secondary | ICD-10-CM | POA: Diagnosis not present

## 2015-04-06 DIAGNOSIS — I482 Chronic atrial fibrillation, unspecified: Secondary | ICD-10-CM

## 2015-04-06 DIAGNOSIS — I4891 Unspecified atrial fibrillation: Secondary | ICD-10-CM

## 2015-04-06 DIAGNOSIS — Z5181 Encounter for therapeutic drug level monitoring: Secondary | ICD-10-CM | POA: Diagnosis not present

## 2015-04-06 LAB — POCT INR: INR: 2.7

## 2015-05-16 ENCOUNTER — Telehealth: Payer: Self-pay | Admitting: *Deleted

## 2015-05-16 NOTE — Telephone Encounter (Signed)
Spoke with pt and she states is now on Cephalexin 250 mg at HS for 90 days and went to see Orthopedic MD yesterday and is now on Doxycycline 100 mg bid for 1 week Pt instructed that there could be interaction between coumadin and Doxycycline and she does have an appt to be seen in coumadin clinic on Thursday so instructed to take meds as ordered and to keep appt on Thursday and she states understanding

## 2015-05-18 ENCOUNTER — Ambulatory Visit (INDEPENDENT_AMBULATORY_CARE_PROVIDER_SITE_OTHER): Payer: Medicare Other | Admitting: *Deleted

## 2015-05-18 DIAGNOSIS — I4891 Unspecified atrial fibrillation: Secondary | ICD-10-CM

## 2015-05-18 DIAGNOSIS — Z5181 Encounter for therapeutic drug level monitoring: Secondary | ICD-10-CM

## 2015-05-18 DIAGNOSIS — Z7901 Long term (current) use of anticoagulants: Secondary | ICD-10-CM | POA: Diagnosis not present

## 2015-05-18 DIAGNOSIS — I482 Chronic atrial fibrillation, unspecified: Secondary | ICD-10-CM

## 2015-05-18 LAB — POCT INR: INR: 4.8

## 2015-05-19 NOTE — Addendum Note (Signed)
Addended by: Pamala Hurry B on: 05/19/2015 02:29 PM   Modules accepted: Orders, Medications

## 2015-05-29 ENCOUNTER — Other Ambulatory Visit: Payer: Self-pay

## 2015-05-29 DIAGNOSIS — Z1231 Encounter for screening mammogram for malignant neoplasm of breast: Secondary | ICD-10-CM

## 2015-06-01 ENCOUNTER — Ambulatory Visit (INDEPENDENT_AMBULATORY_CARE_PROVIDER_SITE_OTHER): Payer: Medicare Other | Admitting: *Deleted

## 2015-06-01 DIAGNOSIS — Z5181 Encounter for therapeutic drug level monitoring: Secondary | ICD-10-CM

## 2015-06-01 DIAGNOSIS — I4891 Unspecified atrial fibrillation: Secondary | ICD-10-CM

## 2015-06-01 DIAGNOSIS — Z7901 Long term (current) use of anticoagulants: Secondary | ICD-10-CM

## 2015-06-01 DIAGNOSIS — I482 Chronic atrial fibrillation, unspecified: Secondary | ICD-10-CM

## 2015-06-01 LAB — POCT INR: INR: 2.6

## 2015-06-22 ENCOUNTER — Ambulatory Visit (INDEPENDENT_AMBULATORY_CARE_PROVIDER_SITE_OTHER): Payer: Medicare Other | Admitting: *Deleted

## 2015-06-22 DIAGNOSIS — I4891 Unspecified atrial fibrillation: Secondary | ICD-10-CM | POA: Diagnosis not present

## 2015-06-22 DIAGNOSIS — Z7901 Long term (current) use of anticoagulants: Secondary | ICD-10-CM | POA: Diagnosis not present

## 2015-06-22 DIAGNOSIS — Z5181 Encounter for therapeutic drug level monitoring: Secondary | ICD-10-CM | POA: Diagnosis not present

## 2015-06-22 DIAGNOSIS — I482 Chronic atrial fibrillation, unspecified: Secondary | ICD-10-CM

## 2015-06-22 LAB — POCT INR: INR: 2.3

## 2015-06-26 ENCOUNTER — Other Ambulatory Visit: Payer: Self-pay | Admitting: *Deleted

## 2015-06-26 MED ORDER — WARFARIN SODIUM 5 MG PO TABS: ORAL_TABLET | ORAL | Status: DC

## 2015-06-26 NOTE — Telephone Encounter (Signed)
Refill done as requested 

## 2015-07-05 ENCOUNTER — Ambulatory Visit
Admission: RE | Admit: 2015-07-05 | Discharge: 2015-07-05 | Disposition: A | Discharge status: Home / Self Care | Payer: Medicare Other | Source: Ambulatory Visit

## 2015-07-05 DIAGNOSIS — Z1231 Encounter for screening mammogram for malignant neoplasm of breast: Secondary | ICD-10-CM

## 2015-07-20 ENCOUNTER — Ambulatory Visit (INDEPENDENT_AMBULATORY_CARE_PROVIDER_SITE_OTHER): Payer: Medicare Other | Admitting: *Deleted

## 2015-07-20 DIAGNOSIS — Z7901 Long term (current) use of anticoagulants: Secondary | ICD-10-CM

## 2015-07-20 DIAGNOSIS — I4891 Unspecified atrial fibrillation: Secondary | ICD-10-CM | POA: Diagnosis not present

## 2015-07-20 DIAGNOSIS — Z5181 Encounter for therapeutic drug level monitoring: Secondary | ICD-10-CM

## 2015-07-20 DIAGNOSIS — I482 Chronic atrial fibrillation, unspecified: Secondary | ICD-10-CM

## 2015-07-20 LAB — POCT INR: INR: 2.6

## 2015-08-30 ENCOUNTER — Encounter: Payer: Self-pay | Admitting: Cardiovascular Disease

## 2015-08-30 ENCOUNTER — Ambulatory Visit (INDEPENDENT_AMBULATORY_CARE_PROVIDER_SITE_OTHER): Payer: Medicare Other | Admitting: Cardiovascular Disease

## 2015-08-30 ENCOUNTER — Telehealth: Payer: Self-pay | Admitting: Pharmacist

## 2015-08-30 ENCOUNTER — Ambulatory Visit (INDEPENDENT_AMBULATORY_CARE_PROVIDER_SITE_OTHER): Payer: Medicare Other | Admitting: Pharmacist

## 2015-08-30 VITALS — BP 102/90 | HR 57 | Ht 64.0 in | Wt 245.1 lb

## 2015-08-30 DIAGNOSIS — I4891 Unspecified atrial fibrillation: Secondary | ICD-10-CM | POA: Diagnosis not present

## 2015-08-30 DIAGNOSIS — I482 Chronic atrial fibrillation, unspecified: Secondary | ICD-10-CM

## 2015-08-30 DIAGNOSIS — Z7901 Long term (current) use of anticoagulants: Secondary | ICD-10-CM

## 2015-08-30 DIAGNOSIS — Z5181 Encounter for therapeutic drug level monitoring: Secondary | ICD-10-CM

## 2015-08-30 DIAGNOSIS — I10 Essential (primary) hypertension: Secondary | ICD-10-CM

## 2015-08-30 LAB — POCT INR: INR: 3.9

## 2015-08-30 NOTE — Telephone Encounter (Signed)
Pt called to report that she is started on Nitrofurantoin  BID for 7 days. Instructed her that this medication does not interact with coumadin. She will call back if they change her antibiotic or start any other new medications.

## 2015-08-30 NOTE — Patient Instructions (Signed)

## 2015-08-30 NOTE — Progress Notes (Signed)
Tammy Robbins Date of Birth  [birth year] [facility]     [place] Office  [address]    [address]   [address] [place], [place]  [address]    [place], [place]  [address] [phone]  Fax  [phone]  [phone]  Fax [phone]  Problems: 1. Atrial fibrillation 3. Hypertension 3. Hyperlipidemia 4. History chest pain-smooth and normal coronary arteries by cardiac catheterization   5. Hypothyroidism 6. Arthritis 7. Aortic stenosis - mild  8. obesity  History of Present Illness:  Tammy Robbins is a 70 year old female with a history of atrial fibrillation. She's been on chronic Coumadin therapy. She's had a cardiac catheterization in the past which revealed smooth and normal coronary arteries. She also has a history of hypertension and hyperlipidemia. She has mild aortic stenosis. She's done well from a cardiac standpoint. She has not had any episodes of chest pain. She has had an upper respiratory tract infection since Thanksgiving.  September 10, 2102: Tammy Robbins has a history of atrial fibrillation. She's been on chronic Coumadin therapy. Her INR levels here.  She's been having some problems with arthritis recently. She denies any cardiac problems. She denies any chest pain or shortness breath or palpitations. She remains active but is not getting any regular exercise.  March 10, 2013:  Tammy Robbins is doing well. She is doing low impact exercises.    No CP or dyspnea.    Jan. 28, 2015:  Tammy Robbins is doing well.  Stays active.  Exercises regularly. No symptoms.   iNR levels are ok.  Did not take BP meds this am.  Cholesterol is managed by Dr. Su Hilt.    Jan. 7, 2016:  Tammy Robbins is a 70 year old female with a history of atrial fibrillation. She's been on chronic Coumadin therapy She has episodes of chest pain on a very rare basis.  Had a cath which showed normal coronaries.   Her NTG has expired. Has recurrent UTIs, seeing a urologist.  Jan. 18, 2017:  Doing well. Lots  of fatigue. INR was 3.9 today .  Not getting much exercise.     Current Outpatient Prescriptions on File Prior to Visit  Medication Sig Dispense Refill  . allopurinol (ZYLOPRIM) 300 MG tablet Take 300 mg by mouth daily.    . carvedilol (COREG) 25 MG tablet TAKE 1 TABLET BY MOUTH TWICE DAILY 180 tablet 3  . Clidinium-Chlordiazepoxide (LIBRAX PO) Take by mouth 2 (two) times daily as needed.     . clotrimazole-betamethasone (LOTRISONE) cream     . digoxin (LANOXIN) 0.125 MG tablet Take 1 tablet (125 mcg total) by mouth daily. 90 tablet 3  . estradiol (ESTRACE) 0.1 MG/GM vaginal cream Place 2 g vaginally as needed.     . fish oil-omega-3 fatty acids 1000 MG capsule Take by mouth daily.      . hydrochlorothiazide (HYDRODIURIL) 25 MG tablet TAKE 1 TABLET BY MOUTH EVERY DAY 90 tablet 3  . levothyroxine (SYNTHROID, LEVOTHROID) 75 MCG tablet Take 75 mcg by mouth daily before breakfast.    . meclizine (ANTIVERT) 12.5 MG tablet Take 12.5 mg by mouth 3 (three) times daily as needed for dizziness.    . Multiple Vitamin (MULTIVITAMIN) tablet Take 1 tablet by mouth daily.      . nitroGLYCERIN (NITROSTAT) 0.4 MG SL tablet Place 1 tablet (0.4 mg total) under the tongue every 5 (five) minutes as needed. 25 tablet 6  . omeprazole (PRILOSEC) 20 MG capsule Take 20 mg by mouth  daily.      . potassium chloride (K-DUR) 10 MEQ tablet TAKE 1 TABLET BY MOUTH TWICE DAILY 180 tablet 3  . simvastatin (ZOCOR) 20 MG tablet Take 20 mg by mouth at bedtime.      . valACYclovir (VALTREX) 1000 MG tablet Take 1,000 mg by mouth 2 (two) times daily.    Marland Kitchen warfarin (COUMADIN) 5 MG tablet Take as directed by coumadin clinic 30 tablet 3   No current facility-administered medications on file prior to visit.    Allergies  Allergen Reactions  . Diflucan [Fluconazole] Rash    Past Medical History  Diagnosis Date  . Chest tightness   . HTN (hypertension)   . Hyperlipemia   . Atrial fibrillation (HCC)   . Mild aortic stenosis    . Diastolic dysfunction   . Mild tricuspid regurgitation   . Anxiety     Past Surgical History  Procedure Laterality Date  . Cardiac catheterization      NORMAL  . Cataract extraction    . Total knee arthroplasty      LEFT  . Partial hysterectomy    . Bunionectomy    . Cholecystectomy    . Abdominal hysterectomy    . Eye surgery    . Joint replacement      History  Smoking status  . Never Smoker   Smokeless tobacco  . Not on file    History  Alcohol Use  . Yes    Comment: occassionally    Family History  Problem Relation Age of Onset  . Intracerebral hemorrhage Father   . Hypertension Father   . Pneumonia Mother   . Lung cancer Brother   . Mental retardation Sister     Reviw of Systems:  Reviewed in the HPI.  All other systems are negative.  Physical Exam: BP 102/90 mmHg  Pulse 57  Ht  (1.626 m)  Wt 245 lb 1.9 oz (111.186 kg)  BMI 42.05 kg/m2 The patient is alert and oriented x 3.  The mood and affect are normal.   Skin: warm and dry.  Color is normal.    HEENT:   Normocephalic/atraumatic. Normal carotids. Mucous membranes are moist. Neck is supple.  Lungs: Lungs are clear   Heart: Irregularly irregular. She has a very soft systolic murmur.    Abdomen: Good bowel sounds. Her abdomen is nontender. There is no hepatosplenomegaly.  Extremities:  No clubbing cyanosis or edema.  Neuro:  Exam is nonfocal.    ECG: August 30, 2015:  Atrial fib with rate of 57.  NS ST abnl.   Assessment / Plan:   1. Atrial fibrillation- continue with rate control and anticoagulation .  Asymptomatic .  Continue current meds.   3. Hypertension - BP is well controlled.   3. Hyperlipidemia 4. History chest pain-smooth and normal coronary arteries by cardiac catheterization   5. Hypothyroidism 6. Arthritis 7. Aortic stenosis - mild  8. Obesity - advised her to get more exercise  9. Fatigue:   Snores,  i've advised her to get a sleep study. She declined .    I'll defer to Dr. Guadalupe Maple.     Madyson Lukach, Deloris Ping, MD  08/30/2015 11:48 AM    [facility] [address] [place],  [address] [place], [place]  [address] Pager [phone] Phone: [phone]; Fax: [phone]   [facility]  [address] [place], [place]  [address] [phone]   Fax [phone]

## 2015-09-04 ENCOUNTER — Telehealth: Payer: Self-pay | Admitting: *Deleted

## 2015-09-04 NOTE — Telephone Encounter (Signed)
Patient called to inform us that she is taking Cephalexin  at bedtime for 90 days.  Also, she states she will probably have to remain on the medication for the rest of her life according to her Physician & she will let us know as time permits.  She will start the medication on Wednesday January 25th 2017 & will complete the Nitrafurantoin on Wednesday, too.  Advised that the medication does not interfere with Coumadin & is safe to take with Coumadin/Warfarin & she verbalized understanding.

## 2015-09-14 ENCOUNTER — Other Ambulatory Visit: Payer: Self-pay | Admitting: Cardiovascular Disease

## 2015-09-20 ENCOUNTER — Ambulatory Visit (INDEPENDENT_AMBULATORY_CARE_PROVIDER_SITE_OTHER): Payer: Medicare Other | Admitting: *Deleted

## 2015-09-20 DIAGNOSIS — I482 Chronic atrial fibrillation, unspecified: Secondary | ICD-10-CM

## 2015-09-20 DIAGNOSIS — I4891 Unspecified atrial fibrillation: Secondary | ICD-10-CM | POA: Diagnosis not present

## 2015-09-20 DIAGNOSIS — Z7901 Long term (current) use of anticoagulants: Secondary | ICD-10-CM | POA: Diagnosis not present

## 2015-09-20 DIAGNOSIS — Z5181 Encounter for therapeutic drug level monitoring: Secondary | ICD-10-CM

## 2015-09-20 LAB — POCT INR: INR: 2.2

## 2015-10-18 ENCOUNTER — Ambulatory Visit (INDEPENDENT_AMBULATORY_CARE_PROVIDER_SITE_OTHER): Payer: Medicare Other | Admitting: *Deleted

## 2015-10-18 DIAGNOSIS — I4891 Unspecified atrial fibrillation: Secondary | ICD-10-CM

## 2015-10-18 DIAGNOSIS — Z7901 Long term (current) use of anticoagulants: Secondary | ICD-10-CM | POA: Diagnosis not present

## 2015-10-18 DIAGNOSIS — Z5181 Encounter for therapeutic drug level monitoring: Secondary | ICD-10-CM

## 2015-10-18 DIAGNOSIS — I482 Chronic atrial fibrillation, unspecified: Secondary | ICD-10-CM

## 2015-10-18 LAB — POCT INR: INR: 3.1

## 2015-10-24 ENCOUNTER — Telehealth: Payer: Self-pay

## 2015-10-24 NOTE — Telephone Encounter (Signed)
Pt called states she has been started on Terbinafine 250mg  QD x 14 days.  Consulted Margaretmary DysMegan Supple, PharmD no drug interaction with Warfarin.  Advised pt to continue on same dosage of Warfarin and keep scheduled f/u appt.

## 2015-11-03 ENCOUNTER — Telehealth: Payer: Self-pay

## 2015-11-03 NOTE — Telephone Encounter (Signed)
Call from Tammy Robbins who stated she had been prescribed Tobramycin eye drops and wanted to be sure there would not be an interaction with her coumadin. Explained this should not be an issue.  Pt able to voice understanding.  Jim Likeeri Suits MHA RN CCM

## 2015-11-15 ENCOUNTER — Ambulatory Visit (INDEPENDENT_AMBULATORY_CARE_PROVIDER_SITE_OTHER): Payer: Medicare Other | Admitting: *Deleted

## 2015-11-15 DIAGNOSIS — I482 Chronic atrial fibrillation, unspecified: Secondary | ICD-10-CM

## 2015-11-15 DIAGNOSIS — I4891 Unspecified atrial fibrillation: Secondary | ICD-10-CM | POA: Diagnosis not present

## 2015-11-15 DIAGNOSIS — Z5181 Encounter for therapeutic drug level monitoring: Secondary | ICD-10-CM | POA: Diagnosis not present

## 2015-11-15 DIAGNOSIS — Z7901 Long term (current) use of anticoagulants: Secondary | ICD-10-CM

## 2015-11-15 LAB — POCT INR: INR: 2.2

## 2015-12-13 ENCOUNTER — Ambulatory Visit (INDEPENDENT_AMBULATORY_CARE_PROVIDER_SITE_OTHER): Payer: Medicare Other | Admitting: *Deleted

## 2015-12-13 DIAGNOSIS — I482 Chronic atrial fibrillation, unspecified: Secondary | ICD-10-CM

## 2015-12-13 DIAGNOSIS — I4891 Unspecified atrial fibrillation: Secondary | ICD-10-CM | POA: Diagnosis not present

## 2015-12-13 DIAGNOSIS — Z5181 Encounter for therapeutic drug level monitoring: Secondary | ICD-10-CM

## 2015-12-13 DIAGNOSIS — Z7901 Long term (current) use of anticoagulants: Secondary | ICD-10-CM

## 2015-12-13 LAB — POCT INR: INR: 2.3

## 2015-12-22 ENCOUNTER — Other Ambulatory Visit: Payer: Self-pay

## 2015-12-22 MED ORDER — HYDROCHLOROTHIAZIDE 25 MG PO TABS: 25.0000 mg | ORAL_TABLET | Freq: Every day | ORAL | Status: DC

## 2015-12-22 NOTE — Telephone Encounter (Signed)
Vesta MixerPhilip J Nahser, MD at 08/30/2015 11:46 AM  hydrochlorothiazide (HYDRODIURIL) 25 MG tabletTAKE 1 TABLET BY MOUTH EVERY DAY Patient Instructions     Medication Instructions:  Your physician recommends that you continue on your current medications as directed. Please refer to the Current Medication list given to you today.

## 2015-12-23 ENCOUNTER — Other Ambulatory Visit: Payer: Self-pay | Admitting: Cardiovascular Disease

## 2016-01-10 ENCOUNTER — Ambulatory Visit (INDEPENDENT_AMBULATORY_CARE_PROVIDER_SITE_OTHER): Payer: Medicare Other | Admitting: *Deleted

## 2016-01-10 DIAGNOSIS — I482 Chronic atrial fibrillation, unspecified: Secondary | ICD-10-CM

## 2016-01-10 DIAGNOSIS — Z5181 Encounter for therapeutic drug level monitoring: Secondary | ICD-10-CM

## 2016-01-10 DIAGNOSIS — I4891 Unspecified atrial fibrillation: Secondary | ICD-10-CM | POA: Diagnosis not present

## 2016-01-10 DIAGNOSIS — Z7901 Long term (current) use of anticoagulants: Secondary | ICD-10-CM | POA: Diagnosis not present

## 2016-01-10 LAB — POCT INR: INR: 2.3

## 2016-01-22 ENCOUNTER — Other Ambulatory Visit: Payer: Self-pay

## 2016-01-22 MED ORDER — CARVEDILOL 25 MG PO TABS: 25.0000 mg | ORAL_TABLET | Freq: Two times a day (BID) | ORAL | Status: DC

## 2016-02-21 ENCOUNTER — Ambulatory Visit (INDEPENDENT_AMBULATORY_CARE_PROVIDER_SITE_OTHER): Payer: Medicare Other | Admitting: Pharmacist

## 2016-02-21 DIAGNOSIS — I4891 Unspecified atrial fibrillation: Secondary | ICD-10-CM | POA: Diagnosis not present

## 2016-02-21 DIAGNOSIS — Z5181 Encounter for therapeutic drug level monitoring: Secondary | ICD-10-CM | POA: Diagnosis not present

## 2016-02-21 DIAGNOSIS — Z7901 Long term (current) use of anticoagulants: Secondary | ICD-10-CM

## 2016-02-21 DIAGNOSIS — I482 Chronic atrial fibrillation, unspecified: Secondary | ICD-10-CM

## 2016-02-21 LAB — POCT INR: INR: 2.6

## 2016-03-14 ENCOUNTER — Other Ambulatory Visit: Payer: Self-pay | Admitting: Cardiovascular Disease

## 2016-04-03 ENCOUNTER — Ambulatory Visit (INDEPENDENT_AMBULATORY_CARE_PROVIDER_SITE_OTHER): Payer: Medicare Other | Admitting: *Deleted

## 2016-04-03 DIAGNOSIS — Z7901 Long term (current) use of anticoagulants: Secondary | ICD-10-CM

## 2016-04-03 DIAGNOSIS — I4891 Unspecified atrial fibrillation: Secondary | ICD-10-CM | POA: Diagnosis not present

## 2016-04-03 DIAGNOSIS — Z5181 Encounter for therapeutic drug level monitoring: Secondary | ICD-10-CM

## 2016-04-03 LAB — POCT INR: INR: 2.8

## 2016-05-01 ENCOUNTER — Other Ambulatory Visit: Payer: Self-pay | Admitting: Cardiovascular Disease

## 2016-05-10 ENCOUNTER — Telehealth: Payer: Self-pay | Admitting: *Deleted

## 2016-05-10 NOTE — Telephone Encounter (Signed)
Pt called to inform [facility] that she has been prescribed Tramadol & Prednisone tapered dose.  Called the Pharmacy & they stated she was prescribed Prednisone Taper & directions are 6 tabs x 1day, 5 tabs x 1 day, 4 tabs x 1day, 3 tabs x 1day, 2 tabs x 1day, & 1 tab x 1 day. Pt aware that the medication can interfere with Coumadin & she needs to be seen in the office on Monday. Spoke with pt & instructed her to take Coumadin 1/2 tablet on today, Saturday, and Sunday then report to appt on Monday, eat a dark green leafy veggie today; she verbalized understanding.

## 2016-05-13 ENCOUNTER — Ambulatory Visit (INDEPENDENT_AMBULATORY_CARE_PROVIDER_SITE_OTHER): Payer: Medicare Other | Admitting: *Deleted

## 2016-05-13 DIAGNOSIS — I4891 Unspecified atrial fibrillation: Secondary | ICD-10-CM

## 2016-05-13 DIAGNOSIS — Z5181 Encounter for therapeutic drug level monitoring: Secondary | ICD-10-CM

## 2016-05-13 DIAGNOSIS — Z7901 Long term (current) use of anticoagulants: Secondary | ICD-10-CM | POA: Diagnosis not present

## 2016-05-13 LAB — POCT INR: INR: 2.6

## 2016-05-31 ENCOUNTER — Other Ambulatory Visit: Payer: Self-pay | Admitting: Internal Medicine

## 2016-05-31 DIAGNOSIS — Z1231 Encounter for screening mammogram for malignant neoplasm of breast: Secondary | ICD-10-CM

## 2016-06-11 ENCOUNTER — Other Ambulatory Visit: Payer: Self-pay | Admitting: Cardiovascular Disease

## 2016-06-24 ENCOUNTER — Other Ambulatory Visit: Payer: Self-pay | Admitting: Cardiovascular Disease

## 2016-06-24 ENCOUNTER — Ambulatory Visit (INDEPENDENT_AMBULATORY_CARE_PROVIDER_SITE_OTHER): Payer: Medicare Other | Admitting: *Deleted

## 2016-06-24 DIAGNOSIS — Z7901 Long term (current) use of anticoagulants: Secondary | ICD-10-CM | POA: Diagnosis not present

## 2016-06-24 DIAGNOSIS — Z5181 Encounter for therapeutic drug level monitoring: Secondary | ICD-10-CM | POA: Diagnosis not present

## 2016-06-24 DIAGNOSIS — I4891 Unspecified atrial fibrillation: Secondary | ICD-10-CM

## 2016-06-24 LAB — POCT INR: INR: 3.2

## 2016-07-12 ENCOUNTER — Ambulatory Visit
Admission: RE | Admit: 2016-07-12 | Discharge: 2016-07-12 | Disposition: A | Discharge status: Home / Self Care | Payer: Medicare Other | Source: Ambulatory Visit | Attending: Internal Medicine | Admitting: Internal Medicine

## 2016-07-12 DIAGNOSIS — Z1231 Encounter for screening mammogram for malignant neoplasm of breast: Secondary | ICD-10-CM

## 2016-07-25 ENCOUNTER — Ambulatory Visit (INDEPENDENT_AMBULATORY_CARE_PROVIDER_SITE_OTHER): Payer: Medicare Other | Admitting: *Deleted

## 2016-07-25 DIAGNOSIS — Z7901 Long term (current) use of anticoagulants: Secondary | ICD-10-CM

## 2016-07-25 DIAGNOSIS — I4891 Unspecified atrial fibrillation: Secondary | ICD-10-CM

## 2016-07-25 DIAGNOSIS — Z5181 Encounter for therapeutic drug level monitoring: Secondary | ICD-10-CM

## 2016-07-25 LAB — POCT INR: INR: 2.6

## 2016-08-28 ENCOUNTER — Telehealth: Payer: Self-pay | Admitting: Cardiovascular Disease

## 2016-08-28 NOTE — Telephone Encounter (Signed)
Due to inclement weather, this patient's 12 mo f/u was rescheduled for March 2018. She states that she feels good her only concern was that she will need to see Dr. Elease HashimotoNahser before she can get more refills. I told her we would give her enough refills to get her throughuntil March.  The prescriptions she asked that we refill are potassium, carvedilol, digoxin & hydrochlorothiazide. She is scheduled to see [facility] Clinic, Monday, 1/22 for her warfarin rx.

## 2016-08-29 ENCOUNTER — Ambulatory Visit: Payer: Medicare Other | Admitting: Cardiovascular Disease

## 2016-08-30 ENCOUNTER — Other Ambulatory Visit: Payer: Self-pay | Admitting: *Deleted

## 2016-08-30 MED ORDER — DIGOXIN 125 MCG PO TABS: 125.0000 ug | ORAL_TABLET | Freq: Every day | ORAL | 0 refills | Status: DC

## 2016-08-30 MED ORDER — HYDROCHLOROTHIAZIDE 25 MG PO TABS: 25.0000 mg | ORAL_TABLET | Freq: Every day | ORAL | 0 refills | Status: DC

## 2016-08-30 MED ORDER — POTASSIUM CHLORIDE ER 10 MEQ PO TBCR: 10.0000 meq | EXTENDED_RELEASE_TABLET | Freq: Two times a day (BID) | ORAL | 0 refills | Status: DC

## 2016-08-30 MED ORDER — WARFARIN SODIUM 5 MG PO TABS: ORAL_TABLET | ORAL | 3 refills | Status: DC

## 2016-08-30 NOTE — Telephone Encounter (Signed)
I have sent in refills for potassium, digoxin and hctz. Patient has refills on carvedilol. Will route to coumadin clinic for warfarin rx.

## 2016-08-30 NOTE — Addendum Note (Signed)
Addended by: Bedie Dominey E on: 08/30/2016 01:50 PM   Modules accepted: Orders

## 2016-08-30 NOTE — Telephone Encounter (Signed)
Warfarin refill sent in. 

## 2016-09-02 ENCOUNTER — Ambulatory Visit (INDEPENDENT_AMBULATORY_CARE_PROVIDER_SITE_OTHER): Payer: Medicare Other | Admitting: *Deleted

## 2016-09-02 DIAGNOSIS — I4891 Unspecified atrial fibrillation: Secondary | ICD-10-CM

## 2016-09-02 DIAGNOSIS — Z7901 Long term (current) use of anticoagulants: Secondary | ICD-10-CM | POA: Diagnosis not present

## 2016-09-02 DIAGNOSIS — Z5181 Encounter for therapeutic drug level monitoring: Secondary | ICD-10-CM

## 2016-09-02 LAB — POCT INR: INR: 3.3

## 2016-09-03 ENCOUNTER — Telehealth: Payer: Self-pay | Admitting: *Deleted

## 2016-09-03 NOTE — Telephone Encounter (Signed)
Pt calls to inform us that she is starting Cephalexin 250mg  QD today, I let her know that there is no interaction with Coumadin. Instructed her to call if she gets any other med changes.

## 2016-09-18 ENCOUNTER — Telehealth: Payer: Self-pay | Admitting: Pharmacist

## 2016-09-18 NOTE — Telephone Encounter (Signed)
Spoke with patient she has cortisone shot in shoulder this morning. She was calling to report. She states this has not messed with INR in past, but wanted to be sure she was ok to keep appt as scheduled. Advised should not affect INR and ok to keep appt as scheduled. She stated understanding and appreciation.

## 2016-09-20 ENCOUNTER — Other Ambulatory Visit: Payer: Self-pay | Admitting: Cardiovascular Disease

## 2016-09-20 MED ORDER — HYDROCHLOROTHIAZIDE 25 MG PO TABS: 25.0000 mg | ORAL_TABLET | Freq: Every day | ORAL | 0 refills | Status: DC

## 2016-09-20 NOTE — Telephone Encounter (Signed)
°*  STAT* If patient is at the pharmacy, call can be transferred to refill team.   1. Which medications need to be refilled? (please list name of each medication and dose if known) Hydrochlorothiazide, 25mg   2. Which pharmacy/location (including street and city if local pharmacy) is medication to be sent to? [facility] [address], [place], [place] [address] [phone]  3. Do they need a 30 day or 90 day supply? unknown

## 2016-09-30 ENCOUNTER — Ambulatory Visit (INDEPENDENT_AMBULATORY_CARE_PROVIDER_SITE_OTHER): Payer: Medicare Other | Admitting: *Deleted

## 2016-09-30 DIAGNOSIS — Z7901 Long term (current) use of anticoagulants: Secondary | ICD-10-CM

## 2016-09-30 DIAGNOSIS — Z5181 Encounter for therapeutic drug level monitoring: Secondary | ICD-10-CM | POA: Diagnosis not present

## 2016-09-30 DIAGNOSIS — I4891 Unspecified atrial fibrillation: Secondary | ICD-10-CM | POA: Diagnosis not present

## 2016-09-30 LAB — POCT INR: INR: 3

## 2016-10-08 ENCOUNTER — Telehealth: Payer: Self-pay | Admitting: Cardiovascular Disease

## 2016-10-08 ENCOUNTER — Other Ambulatory Visit: Payer: Self-pay | Admitting: *Deleted

## 2016-10-08 ENCOUNTER — Encounter: Payer: Self-pay | Admitting: *Deleted

## 2016-10-08 DIAGNOSIS — K219 Gastro-esophageal reflux disease without esophagitis: Secondary | ICD-10-CM | POA: Insufficient documentation

## 2016-10-08 DIAGNOSIS — K589 Irritable bowel syndrome without diarrhea: Secondary | ICD-10-CM | POA: Insufficient documentation

## 2016-10-08 DIAGNOSIS — M109 Gout, unspecified: Secondary | ICD-10-CM | POA: Insufficient documentation

## 2016-10-08 DIAGNOSIS — E039 Hypothyroidism, unspecified: Secondary | ICD-10-CM | POA: Insufficient documentation

## 2016-10-08 MED ORDER — POTASSIUM CHLORIDE ER 10 MEQ PO TBCR: 10.0000 meq | EXTENDED_RELEASE_TABLET | Freq: Two times a day (BID) | ORAL | 0 refills | Status: DC

## 2016-10-08 NOTE — Telephone Encounter (Signed)
New message    *STAT* If patient is at the pharmacy, call can be transferred to refill team.   1. Which medications need to be refilled? (please list name of each medication and dose if known) potassium chloride 10meq  2. Which pharmacy/location (including street and city if local pharmacy) is [facility] be sent to[facility] [address]  3. Do they need a 30 day or 90 day supply? 30 day

## 2016-10-10 ENCOUNTER — Other Ambulatory Visit: Payer: Self-pay | Admitting: Cardiovascular Disease

## 2016-10-10 MED ORDER — DIGOXIN 125 MCG PO TABS: 125.0000 ug | ORAL_TABLET | Freq: Every day | ORAL | 0 refills | Status: DC

## 2016-10-17 ENCOUNTER — Encounter: Payer: Self-pay | Admitting: *Deleted

## 2016-10-17 ENCOUNTER — Other Ambulatory Visit: Payer: Self-pay | Admitting: Cardiovascular Disease

## 2016-10-24 ENCOUNTER — Ambulatory Visit (INDEPENDENT_AMBULATORY_CARE_PROVIDER_SITE_OTHER): Payer: Medicare Other | Admitting: Pharmacist

## 2016-10-24 ENCOUNTER — Encounter: Payer: Self-pay | Admitting: Cardiovascular Disease

## 2016-10-24 ENCOUNTER — Ambulatory Visit (INDEPENDENT_AMBULATORY_CARE_PROVIDER_SITE_OTHER): Payer: Medicare Other | Admitting: Cardiovascular Disease

## 2016-10-24 VITALS — BP 120/80 | HR 65 | Ht 64.0 in | Wt 237.2 lb

## 2016-10-24 DIAGNOSIS — Z5181 Encounter for therapeutic drug level monitoring: Secondary | ICD-10-CM

## 2016-10-24 DIAGNOSIS — I4891 Unspecified atrial fibrillation: Secondary | ICD-10-CM | POA: Diagnosis not present

## 2016-10-24 DIAGNOSIS — Z7901 Long term (current) use of anticoagulants: Secondary | ICD-10-CM | POA: Diagnosis not present

## 2016-10-24 DIAGNOSIS — I482 Chronic atrial fibrillation, unspecified: Secondary | ICD-10-CM

## 2016-10-24 LAB — POCT INR: INR: 3.6

## 2016-10-24 NOTE — Progress Notes (Signed)
Tammy Robbins Date of Birth  02/16/1946 [facility]     [place] Office  [address]    [address]   [address] [place], [place]  [address]    [place], [place]  [address] [phone]  Fax  [phone]  [phone]  Fax [phone]  Problems: 1. Atrial fibrillation 3. Hypertension 3. Hyperlipidemia 4. History chest pain-smooth and normal coronary arteries by cardiac catheterization   5. Hypothyroidism 6. Arthritis 7. Aortic stenosis - mild  8. obesity  History of Present Illness:  Tammy Robbins is a 71 year old female with a history of atrial fibrillation. She's been on chronic Coumadin therapy. She's had a cardiac catheterization in the past which revealed smooth and normal coronary arteries. She also has a history of hypertension and hyperlipidemia. She has mild aortic stenosis. She's done well from a cardiac standpoint. She has not had any episodes of chest pain. She has had an upper respiratory tract infection since Thanksgiving.  September 10, 2102: Tammy Robbins has a history of atrial fibrillation. She's been on chronic Coumadin therapy. Her INR levels here.  She's been having some problems with arthritis recently. She denies any cardiac problems. She denies any chest pain or shortness breath or palpitations. She remains active but is not getting any regular exercise.  March 10, 2013:  Tammy Robbins is doing well. She is doing low impact exercises.    No CP or dyspnea.    Jan. 28, 2015:  Tammy Robbins is doing well.  Stays active.  Exercises regularly. No symptoms.   iNR levels are ok.  Did not take BP meds this am.  Cholesterol is managed by Dr. Su Hilt.    Jan. 7, 2016:  Tammy Robbins is a 71 year old female with a history of atrial fibrillation. She's been on chronic Coumadin therapy She has episodes of chest pain on a very rare basis.  Had a cath which showed normal coronaries.   Her NTG has expired. Has recurrent UTIs, seeing a urologist.  Jan. 18, 2017:  Doing well. Lots  of fatigue. INR was 3.9 today .  Not getting much exercise.     October 24, 2016:  Tammy Robbins is seen today  For follow up of her atrial fib .   CHADS2VASC is  58   ( female, age 47 , HTN)  Has chronic AF.   Is on coumadin .   No CP or limitations   Current Outpatient Prescriptions on File Prior to Visit  Medication Sig Dispense Refill  . allopurinol (ZYLOPRIM) 300 MG tablet Take 300 mg by mouth daily.    . carvedilol (COREG) 25 MG tablet Take 1 tablet (25 mg total) by mouth 2 (two) times daily. 180 tablet 3  . cephALEXin (KEFLEX) 250 MG capsule Take 250 mg by mouth daily.    . Clidinium-Chlordiazepoxide (LIBRAX PO) Take by mouth 2 (two) times daily as needed.     . clotrimazole-betamethasone (LOTRISONE) cream     . digoxin (DIGOX) 0.125 MG tablet Take 1 tablet (125 mcg total) by mouth daily. 90 tablet 0  . estradiol (ESTRACE) 0.1 MG/GM vaginal cream Place 2 g vaginally as needed.     . fish oil-omega-3 fatty acids 1000 MG capsule Take by mouth daily.      . hydrochlorothiazide (HYDRODIURIL) 25 MG tablet Take 1 tablet (25 mg total) by mouth daily. 90 tablet 0  . levothyroxine (SYNTHROID, LEVOTHROID) 75 MCG tablet Take 75 mcg by mouth daily before breakfast.    . meclizine (ANTIVERT) 12.5  MG tablet Take 12.5 mg by mouth 3 (three) times daily as needed for dizziness.    . Multiple Vitamin (MULTIVITAMIN) tablet Take 1 tablet by mouth daily.      . nitroGLYCERIN (NITROSTAT) 0.4 MG SL tablet Place 1 tablet (0.4 mg total) under the tongue every 5 (five) minutes as needed. 25 tablet 6  . omeprazole (PRILOSEC) 20 MG capsule Take 20 mg by mouth daily.      . potassium chloride (K-DUR) 10 MEQ tablet Take 1 tablet (10 mEq total) by mouth 2 (two) times daily. Please schedule follow up appointment 60 tablet 0  . simvastatin (ZOCOR) 20 MG tablet Take 20 mg by mouth at bedtime.      Marland Kitchen warfarin (COUMADIN) 5 MG tablet TAKE AS DIRECTED BY COUMADIN CLINIC 30 tablet 3   No current facility-administered medications  on file prior to visit.     Allergies  Allergen Reactions  . Diflucan [Fluconazole] Rash    Past Medical History:  Diagnosis Date  . Anxiety   . Atrial fibrillation (HCC)   . Chest tightness   . Diastolic dysfunction   . HTN (hypertension)   . Hyperlipemia   . Mild aortic stenosis   . Mild tricuspid regurgitation     Past Surgical History:  Procedure Laterality Date  . ABDOMINAL HYSTERECTOMY    . BUNIONECTOMY    . CARDIAC CATHETERIZATION     NORMAL  . CATARACT EXTRACTION    . CHOLECYSTECTOMY    . EYE SURGERY    . JOINT REPLACEMENT    . PARTIAL HYSTERECTOMY    . TOTAL KNEE ARTHROPLASTY     LEFT    History  Smoking Status  . Never Smoker  Smokeless Tobacco  . Never Used    History  Alcohol Use  . Yes    Comment: occassionally    Family History  Problem Relation Age of Onset  . Intracerebral hemorrhage Father   . Hypertension Father   . Pneumonia Mother   . Mental retardation Sister   . Lung cancer Brother     Reviw of Systems:  Reviewed in the HPI.  All other systems are negative.  Physical Exam: BP 120/80 (BP Location: Left Arm, Patient Position: Sitting, Cuff Size: Large)   Pulse 65   Ht 5\' 4"  (1.626 m)   Wt 237 lb 3.4 oz (107.6 kg)   SpO2 97%   BMI 40.72 kg/m  The patient is alert and oriented x 3.  The mood and affect are normal.   Skin: warm and dry.  Color is normal.    HEENT:   Normocephalic/atraumatic. Normal carotids. Mucous membranes are moist. Neck is supple.  Lungs: Lungs are clear   Heart: Irregularly irregular. She has a very soft systolic murmur.    Abdomen: Good bowel sounds. Her abdomen is nontender. There is no hepatosplenomegaly.  Extremities:  No clubbing cyanosis or edema.  Neuro:  Exam is nonfocal.    ECG: October 24, 2016:   Atrial fib at 3.     Assessment / Plan:   1. Atrial fibrillation- continue with rate control and anticoagulation .  Asymptomatic .  Will DC Digoxin. I don't think her HR will increase    3. Hypertension - BP is well controlled.   3. Hyperlipidemia 4. History chest pain-smooth and normal coronary arteries by cardiac catheterization   5. Hypothyroidism 6. Arthritis 7. Aortic stenosis - mild  8. Obesity - advised her to get more exercise  9. Fatigue:  Snores,  i've advised her to get a sleep study. She declined .   I'll defer to Dr. Su Hiltoberts.     Kristeen MissPhilip Perlie Scheuring, MD  10/24/2016 10:55 AM    [facility] [address],  [address] [place], [place]  [address] Pager [phone] Phone: [phone]; Fax: [phone]

## 2016-10-24 NOTE — Patient Instructions (Signed)
Medication Instructions:  STOP Digoxin   Labwork: None Ordered   Testing/Procedures: None Ordered   Follow-Up: Your physician wants you to follow-up in: 1 year with Dr. Nahser.  You will receive a reminder letter in the mail two months in advance. If you don't receive a letter, please call our office to schedule the follow-up appointment.   If you need a refill on your cardiac medications before your next appointment, please call your pharmacy.   Thank you for choosing [facility]! Zayden Maffei, RN [phone]    

## 2016-11-04 ENCOUNTER — Other Ambulatory Visit: Payer: Self-pay | Admitting: Cardiovascular Disease

## 2016-11-06 NOTE — Telephone Encounter (Signed)
Patient was seen recently however she does not have a recent bmet in epic. Okay to refill? Please advise. Thanks, MI

## 2016-11-07 ENCOUNTER — Other Ambulatory Visit: Payer: Self-pay | Admitting: *Deleted

## 2016-11-07 MED ORDER — POTASSIUM CHLORIDE ER 10 MEQ PO TBCR: 10.0000 meq | EXTENDED_RELEASE_TABLET | Freq: Two times a day (BID) | ORAL | 11 refills | Status: DC

## 2016-11-07 NOTE — Telephone Encounter (Signed)
Yes. I called Dr. Su Hiltoberts' office for most recent bmet

## 2016-11-07 NOTE — Telephone Encounter (Signed)
Okay to refill? Please advise. Thanks, MI 

## 2016-11-22 ENCOUNTER — Ambulatory Visit (INDEPENDENT_AMBULATORY_CARE_PROVIDER_SITE_OTHER): Payer: Medicare Other | Admitting: *Deleted

## 2016-11-22 DIAGNOSIS — Z5181 Encounter for therapeutic drug level monitoring: Secondary | ICD-10-CM | POA: Diagnosis not present

## 2016-11-22 DIAGNOSIS — Z7901 Long term (current) use of anticoagulants: Secondary | ICD-10-CM

## 2016-11-22 DIAGNOSIS — I4891 Unspecified atrial fibrillation: Secondary | ICD-10-CM

## 2016-11-22 LAB — POCT INR: INR: 2.6

## 2016-12-19 ENCOUNTER — Other Ambulatory Visit: Payer: Self-pay | Admitting: Cardiovascular Disease

## 2016-12-20 ENCOUNTER — Ambulatory Visit (INDEPENDENT_AMBULATORY_CARE_PROVIDER_SITE_OTHER): Payer: Medicare Other | Admitting: Pharmacist

## 2016-12-20 DIAGNOSIS — Z7901 Long term (current) use of anticoagulants: Secondary | ICD-10-CM | POA: Diagnosis not present

## 2016-12-20 DIAGNOSIS — I4891 Unspecified atrial fibrillation: Secondary | ICD-10-CM | POA: Diagnosis not present

## 2016-12-20 DIAGNOSIS — Z5181 Encounter for therapeutic drug level monitoring: Secondary | ICD-10-CM

## 2016-12-20 LAB — POCT INR: INR: 2.3

## 2016-12-23 ENCOUNTER — Other Ambulatory Visit: Payer: Self-pay | Admitting: *Deleted

## 2016-12-23 MED ORDER — WARFARIN SODIUM 5 MG PO TABS: ORAL_TABLET | ORAL | 3 refills | Status: DC

## 2016-12-24 ENCOUNTER — Other Ambulatory Visit: Payer: Self-pay | Admitting: *Deleted

## 2016-12-24 NOTE — Telephone Encounter (Signed)
Pharmacy requests ninety day. 

## 2017-01-01 ENCOUNTER — Other Ambulatory Visit: Payer: Self-pay | Admitting: *Deleted

## 2017-01-01 MED ORDER — WARFARIN SODIUM 5 MG PO TABS: ORAL_TABLET | ORAL | 3 refills | Status: DC

## 2017-01-01 NOTE — Telephone Encounter (Signed)
Pharmacy requests ninety day. 

## 2017-01-05 ENCOUNTER — Other Ambulatory Visit: Payer: Self-pay | Admitting: Cardiovascular Disease

## 2017-01-07 NOTE — Telephone Encounter (Signed)
    Patient Instructions by Levi AlandSwinyer, Michelle M, RN at 10/24/2016 10:15 AM   Author: Levi AlandSwinyer, Michelle M, RN Author Type: Registered Nurse Filed: 10/24/2016 11:05 AM  Note Status: Signed Cosign: Cosign Not Required Encounter Date: 10/24/2016  Editor: Levi AlandSwinyer, Michelle M, RN (Registered Nurse)    Medication Instructions:  STOP Digoxin

## 2017-01-24 ENCOUNTER — Other Ambulatory Visit: Payer: Self-pay | Admitting: Cardiovascular Disease

## 2017-01-24 ENCOUNTER — Ambulatory Visit (INDEPENDENT_AMBULATORY_CARE_PROVIDER_SITE_OTHER): Payer: Medicare Other | Admitting: *Deleted

## 2017-01-24 DIAGNOSIS — Z5181 Encounter for therapeutic drug level monitoring: Secondary | ICD-10-CM | POA: Diagnosis not present

## 2017-01-24 DIAGNOSIS — Z7901 Long term (current) use of anticoagulants: Secondary | ICD-10-CM | POA: Diagnosis not present

## 2017-01-24 DIAGNOSIS — I4891 Unspecified atrial fibrillation: Secondary | ICD-10-CM | POA: Diagnosis not present

## 2017-01-24 LAB — POCT INR: INR: 3.3

## 2017-02-10 ENCOUNTER — Telehealth: Payer: Self-pay | Admitting: Pharmacist

## 2017-02-10 NOTE — Telephone Encounter (Signed)
Pt scheduled for eyelid surgery on 02/17/17, requesting . Pt warfarin for Afib with CHADS<4 and no history of stroke. Per protocol ok to hold 5 days prior to procedure. Resume evening of procedure with extra 1/2 tablet for 2 days.   Spoke with patient and made aware of above. Appt made to recheck INR 2 weeks post-procedure  Fax sent to number provided [phone]

## 2017-02-17 ENCOUNTER — Other Ambulatory Visit: Payer: Self-pay | Admitting: Ophthalmology

## 2017-02-28 ENCOUNTER — Ambulatory Visit (INDEPENDENT_AMBULATORY_CARE_PROVIDER_SITE_OTHER): Payer: Medicare Other | Admitting: *Deleted

## 2017-02-28 DIAGNOSIS — Z7901 Long term (current) use of anticoagulants: Secondary | ICD-10-CM

## 2017-02-28 DIAGNOSIS — Z5181 Encounter for therapeutic drug level monitoring: Secondary | ICD-10-CM

## 2017-02-28 DIAGNOSIS — I4891 Unspecified atrial fibrillation: Secondary | ICD-10-CM

## 2017-02-28 LAB — POCT INR: INR: 2

## 2017-03-26 ENCOUNTER — Telehealth: Payer: Self-pay | Admitting: Pharmacist

## 2017-03-26 NOTE — Telephone Encounter (Signed)
Patient with diagnosis of Afib on warfarin for anticoagulation.    Procedure: Eyelid reconstruction Date of procedure: 05/19/2017  CHADS2 score of 2 (CHF, HTN);  CHADS2-VASc score of  4 (CHF, HTN, AGE, female)  Per office protocol, patient can hold warfarin for 5 days prior to procedure.   Patient will not need bridging with Lovenox (enoxaparin) around procedure.  Patient should restart warfarin on the evening of procedure or at discretion of procedure MD.   Faxed to number provided.

## 2017-03-28 ENCOUNTER — Ambulatory Visit (INDEPENDENT_AMBULATORY_CARE_PROVIDER_SITE_OTHER): Payer: Medicare Other | Admitting: *Deleted

## 2017-03-28 DIAGNOSIS — Z7901 Long term (current) use of anticoagulants: Secondary | ICD-10-CM

## 2017-03-28 DIAGNOSIS — I4891 Unspecified atrial fibrillation: Secondary | ICD-10-CM

## 2017-03-28 DIAGNOSIS — Z5181 Encounter for therapeutic drug level monitoring: Secondary | ICD-10-CM

## 2017-03-28 LAB — POCT INR: INR: 2.4

## 2017-05-02 ENCOUNTER — Ambulatory Visit (INDEPENDENT_AMBULATORY_CARE_PROVIDER_SITE_OTHER): Payer: Medicare Other | Admitting: *Deleted

## 2017-05-02 DIAGNOSIS — Z5181 Encounter for therapeutic drug level monitoring: Secondary | ICD-10-CM

## 2017-05-02 DIAGNOSIS — I4891 Unspecified atrial fibrillation: Secondary | ICD-10-CM | POA: Diagnosis not present

## 2017-05-02 DIAGNOSIS — Z7901 Long term (current) use of anticoagulants: Secondary | ICD-10-CM | POA: Diagnosis not present

## 2017-05-02 LAB — POCT INR: INR: 2.2

## 2017-05-02 NOTE — Patient Instructions (Signed)
Take last dose on 05/13/17, once you resume restart with normal dose.   Coumadin Clinic appt 1 week after your procedure.

## 2017-05-26 ENCOUNTER — Ambulatory Visit (INDEPENDENT_AMBULATORY_CARE_PROVIDER_SITE_OTHER): Payer: Medicare Other | Admitting: *Deleted

## 2017-05-26 DIAGNOSIS — Z5181 Encounter for therapeutic drug level monitoring: Secondary | ICD-10-CM

## 2017-05-26 DIAGNOSIS — I4891 Unspecified atrial fibrillation: Secondary | ICD-10-CM | POA: Diagnosis not present

## 2017-05-26 DIAGNOSIS — Z7901 Long term (current) use of anticoagulants: Secondary | ICD-10-CM | POA: Diagnosis not present

## 2017-05-26 LAB — POCT INR: INR: 1.5

## 2017-05-28 ENCOUNTER — Other Ambulatory Visit: Payer: Self-pay | Admitting: Internal Medicine

## 2017-05-28 DIAGNOSIS — Z1231 Encounter for screening mammogram for malignant neoplasm of breast: Secondary | ICD-10-CM

## 2017-06-06 ENCOUNTER — Ambulatory Visit (INDEPENDENT_AMBULATORY_CARE_PROVIDER_SITE_OTHER): Payer: Medicare Other | Admitting: *Deleted

## 2017-06-06 DIAGNOSIS — I4891 Unspecified atrial fibrillation: Secondary | ICD-10-CM | POA: Diagnosis not present

## 2017-06-06 DIAGNOSIS — Z5181 Encounter for therapeutic drug level monitoring: Secondary | ICD-10-CM | POA: Diagnosis not present

## 2017-06-06 DIAGNOSIS — Z7901 Long term (current) use of anticoagulants: Secondary | ICD-10-CM

## 2017-06-06 LAB — POCT INR: INR: 2.8

## 2017-06-14 ENCOUNTER — Other Ambulatory Visit: Payer: Self-pay | Admitting: Cardiovascular Disease

## 2017-06-27 ENCOUNTER — Ambulatory Visit (INDEPENDENT_AMBULATORY_CARE_PROVIDER_SITE_OTHER): Payer: Medicare Other | Admitting: *Deleted

## 2017-06-27 DIAGNOSIS — Z5181 Encounter for therapeutic drug level monitoring: Secondary | ICD-10-CM | POA: Diagnosis not present

## 2017-06-27 DIAGNOSIS — I4891 Unspecified atrial fibrillation: Secondary | ICD-10-CM | POA: Diagnosis not present

## 2017-06-27 DIAGNOSIS — Z7901 Long term (current) use of anticoagulants: Secondary | ICD-10-CM

## 2017-06-27 LAB — POCT INR: INR: 2.9

## 2017-07-14 ENCOUNTER — Ambulatory Visit
Admission: RE | Admit: 2017-07-14 | Discharge: 2017-07-14 | Disposition: A | Discharge status: Home / Self Care | Payer: Medicare Other | Source: Ambulatory Visit | Attending: Internal Medicine | Admitting: Internal Medicine

## 2017-07-14 DIAGNOSIS — Z1231 Encounter for screening mammogram for malignant neoplasm of breast: Secondary | ICD-10-CM

## 2017-07-25 ENCOUNTER — Ambulatory Visit (INDEPENDENT_AMBULATORY_CARE_PROVIDER_SITE_OTHER): Payer: Medicare Other | Admitting: *Deleted

## 2017-07-25 DIAGNOSIS — I4891 Unspecified atrial fibrillation: Secondary | ICD-10-CM

## 2017-07-25 DIAGNOSIS — Z7901 Long term (current) use of anticoagulants: Secondary | ICD-10-CM

## 2017-07-25 DIAGNOSIS — Z5181 Encounter for therapeutic drug level monitoring: Secondary | ICD-10-CM

## 2017-07-25 LAB — POCT INR: INR: 3.1

## 2017-07-25 NOTE — Patient Instructions (Signed)
Description   Skip today's dose, then Continue same dose 1 tablet daily except 1/2 tablet on Mondays, Wednesdays and Fridays. Call if started on new meds or antibiotics [phone]. Recheck INR in 4 weeks.

## 2017-08-14 ENCOUNTER — Other Ambulatory Visit: Payer: Self-pay | Admitting: Cardiovascular Disease

## 2017-08-14 MED ORDER — POTASSIUM CHLORIDE ER 10 MEQ PO TBCR: 10.0000 meq | EXTENDED_RELEASE_TABLET | Freq: Two times a day (BID) | ORAL | 0 refills | Status: DC

## 2017-08-22 ENCOUNTER — Ambulatory Visit (INDEPENDENT_AMBULATORY_CARE_PROVIDER_SITE_OTHER): Payer: Medicare Other | Admitting: *Deleted

## 2017-08-22 DIAGNOSIS — I4891 Unspecified atrial fibrillation: Secondary | ICD-10-CM | POA: Diagnosis not present

## 2017-08-22 DIAGNOSIS — Z5181 Encounter for therapeutic drug level monitoring: Secondary | ICD-10-CM | POA: Diagnosis not present

## 2017-08-22 DIAGNOSIS — Z7901 Long term (current) use of anticoagulants: Secondary | ICD-10-CM | POA: Diagnosis not present

## 2017-08-22 LAB — POCT INR: INR: 1.9

## 2017-08-22 NOTE — Patient Instructions (Signed)
Description   Today take 1 tablet then continue same dose 1 tablet daily except 1/2 tablet on Mondays, Wednesdays and Fridays. Call if started on new meds or antibiotics [phone]. Recheck INR in 4 weeks.

## 2017-09-05 ENCOUNTER — Other Ambulatory Visit: Payer: Self-pay | Admitting: Cardiovascular Disease

## 2017-09-05 MED ORDER — HYDROCHLOROTHIAZIDE 25 MG PO TABS: 25.0000 mg | ORAL_TABLET | Freq: Every day | ORAL | 0 refills | Status: DC

## 2017-09-19 ENCOUNTER — Ambulatory Visit (INDEPENDENT_AMBULATORY_CARE_PROVIDER_SITE_OTHER): Payer: Medicare Other | Admitting: *Deleted

## 2017-09-19 DIAGNOSIS — I4891 Unspecified atrial fibrillation: Secondary | ICD-10-CM | POA: Diagnosis not present

## 2017-09-19 DIAGNOSIS — Z5181 Encounter for therapeutic drug level monitoring: Secondary | ICD-10-CM | POA: Diagnosis not present

## 2017-09-19 DIAGNOSIS — Z7901 Long term (current) use of anticoagulants: Secondary | ICD-10-CM | POA: Diagnosis not present

## 2017-09-19 LAB — POCT INR: INR: 2.5

## 2017-09-19 NOTE — Patient Instructions (Signed)
Description   Continue same dose 1 tablet daily except 1/2 tablet on Mondays, Wednesdays and Fridays. Call if started on new meds or antibiotics [phone]. Recheck INR in 4 weeks.

## 2017-10-06 ENCOUNTER — Other Ambulatory Visit: Payer: Self-pay | Admitting: Cardiovascular Disease

## 2017-10-17 ENCOUNTER — Ambulatory Visit (INDEPENDENT_AMBULATORY_CARE_PROVIDER_SITE_OTHER): Payer: Medicare Other | Admitting: *Deleted

## 2017-10-17 DIAGNOSIS — Z7901 Long term (current) use of anticoagulants: Secondary | ICD-10-CM | POA: Diagnosis not present

## 2017-10-17 DIAGNOSIS — Z5181 Encounter for therapeutic drug level monitoring: Secondary | ICD-10-CM

## 2017-10-17 DIAGNOSIS — I4891 Unspecified atrial fibrillation: Secondary | ICD-10-CM

## 2017-10-17 LAB — POCT INR: INR: 2.9

## 2017-10-17 NOTE — Patient Instructions (Signed)
Description   Continue same dose 1 tablet daily except 1/2 tablet on Mondays, Wednesdays and Fridays. Call if started on new meds or antibiotics [phone]. Recheck INR in 4 weeks.

## 2017-10-21 ENCOUNTER — Other Ambulatory Visit: Payer: Self-pay | Admitting: Cardiovascular Disease

## 2017-11-18 ENCOUNTER — Ambulatory Visit (INDEPENDENT_AMBULATORY_CARE_PROVIDER_SITE_OTHER): Payer: Medicare Other | Admitting: *Deleted

## 2017-11-18 DIAGNOSIS — I4891 Unspecified atrial fibrillation: Secondary | ICD-10-CM | POA: Diagnosis not present

## 2017-11-18 DIAGNOSIS — Z5181 Encounter for therapeutic drug level monitoring: Secondary | ICD-10-CM | POA: Diagnosis not present

## 2017-11-18 DIAGNOSIS — Z7901 Long term (current) use of anticoagulants: Secondary | ICD-10-CM | POA: Diagnosis not present

## 2017-11-18 LAB — POCT INR: INR: 2.6

## 2017-11-18 NOTE — Patient Instructions (Signed)
Description   Continue same dose 1 tablet daily except 1/2 tablet on Mondays, Wednesdays and Fridays. Call if started on new meds or antibiotics #[phone]. Recheck INR in 6 weeks.    

## 2017-11-19 ENCOUNTER — Encounter: Payer: Self-pay | Admitting: Cardiovascular Disease

## 2017-11-25 ENCOUNTER — Ambulatory Visit (INDEPENDENT_AMBULATORY_CARE_PROVIDER_SITE_OTHER): Payer: Medicare Other | Admitting: Cardiovascular Disease

## 2017-11-25 ENCOUNTER — Other Ambulatory Visit: Payer: Self-pay | Admitting: Cardiovascular Disease

## 2017-11-25 ENCOUNTER — Encounter: Payer: Self-pay | Admitting: Cardiovascular Disease

## 2017-11-25 VITALS — BP 124/86 | HR 77 | Ht 64.0 in | Wt 259.0 lb

## 2017-11-25 DIAGNOSIS — I482 Chronic atrial fibrillation, unspecified: Secondary | ICD-10-CM

## 2017-11-25 DIAGNOSIS — I10 Essential (primary) hypertension: Secondary | ICD-10-CM

## 2017-11-25 NOTE — Progress Notes (Signed)
Tammy Robbins Date of Birth  August 19, 1945 [facility]     [place] Office  [address]    [address]   [address] [place], [place]  [address]    [place], [place]  [address] [phone]  Fax  [phone]  [phone]  Fax [phone]  Problems: 1. Atrial fibrillation 3. Hypertension 3. Hyperlipidemia 4. History chest pain-smooth and normal coronary arteries by cardiac catheterization   5. Hypothyroidism 6. Arthritis 7. Aortic stenosis - mild  8. obesity  History of Present Illness:  Tammy Robbins is a 72 year old female with a history of atrial fibrillation. She's been on chronic Coumadin therapy. She's had a cardiac catheterization in the past which revealed smooth and normal coronary arteries. She also has a history of hypertension and hyperlipidemia. She has mild aortic stenosis. She's done well from a cardiac standpoint. She has not had any episodes of chest pain. She has had an upper respiratory tract infection since Thanksgiving.  September 10, 2102: Tammy Robbins has a history of atrial fibrillation. She's been on chronic Coumadin therapy. Her INR levels here.  She's been having some problems with arthritis recently. She denies any cardiac problems. She denies any chest pain or shortness breath or palpitations. She remains active but is not getting any regular exercise.  March 10, 2013:  Tammy Robbins is doing well. She is doing low impact exercises.    No CP or dyspnea.    Jan. 28, 2015:  Tammy Robbins is doing well.  Stays active.  Exercises regularly. No symptoms.   iNR levels are ok.  Did not take BP meds this am.  Cholesterol is managed by Dr. Su Hilt.    Jan. 7, 2016:  Tammy Robbins is a 72 year old female with a history of atrial fibrillation. She's been on chronic Coumadin therapy She has episodes of chest pain on a very rare basis.  Had a cath which showed normal coronaries.   Her NTG has expired. Has recurrent UTIs, seeing a urologist.  Jan. 18, 2017:  Doing well. Lots  of fatigue. INR was 3.9 today .  Not getting much exercise.     October 24, 2016:  Tammy Robbins is seen today  For follow up of her atrial fib .   CHADS2VASC is  37   ( female, age 4 , HTN)  Has chronic AF.   Is on coumadin .   No CP or limitations   November 25, 2017:  Tammy Robbins is seen today for follow-up of her atrial fibrillation. Husband passed away last 02-14-2023 .  She has had Mohs surgery for a face cancer  Is on coumadin  - INR levels look good     Current Outpatient Medications on File Prior to Visit  Medication Sig Dispense Refill  . allopurinol (ZYLOPRIM) 300 MG tablet Take 300 mg by mouth daily.    . carvedilol (COREG) 25 MG tablet TAKE 1 TABLET BY MOUTH TWICE A DAY 180 tablet 2  . cephALEXin (KEFLEX) 250 MG capsule Take 250 mg by mouth daily.    . chlordiazePOXIDE (LIBRIUM) 10 MG capsule Take 10 mg by mouth daily as needed. As needed for IBS  5  . Clidinium-Chlordiazepoxide (LIBRAX PO) Take by mouth 2 (two) times daily as needed.     . clotrimazole-betamethasone (LOTRISONE) cream     . estradiol (ESTRACE) 0.1 MG/GM vaginal cream Place 2 g vaginally as needed.     . fish oil-omega-3 fatty acids 1000 MG capsule Take by mouth daily.      Marland Kitchen  hydrochlorothiazide (HYDRODIURIL) 25 MG tablet Take 1 tablet (25 mg total) by mouth daily. Please keep upcoming appt for future refills. Thank you 90 tablet 0  . levothyroxine (SYNTHROID, LEVOTHROID) 75 MCG tablet Take 75 mcg by mouth daily before breakfast.    . meclizine (ANTIVERT) 12.5 MG tablet Take 12.5 mg by mouth 3 (three) times daily as needed for dizziness.    . Multiple Vitamin (MULTIVITAMIN) tablet Take 1 tablet by mouth daily.      . nitroGLYCERIN (NITROSTAT) 0.4 MG SL tablet Place 1 tablet (0.4 mg total) under the tongue every 5 (five) minutes as needed. 25 tablet 6  . omeprazole (PRILOSEC) 20 MG capsule Take 20 mg by mouth daily.      . potassium chloride (K-DUR) 10 MEQ tablet Take 1 tablet (10 mEq total) by mouth 2 (two) times daily. Please  make yearly appt with Dr. Elease Hashimoto for March. 1st attempt 180 tablet 0  . simvastatin (ZOCOR) 20 MG tablet Take 20 mg by mouth at bedtime.      Marland Kitchen warfarin (COUMADIN) 5 MG tablet TAKE AS DIRECTED BY COUMADIN CLINIC 30 tablet 3   No current facility-administered medications on file prior to visit.     Allergies  Allergen Reactions  . Diflucan [Fluconazole] Rash    Past Medical History:  Diagnosis Date  . Anxiety   . Atrial fibrillation (HCC)   . Chest tightness   . Diastolic dysfunction   . HTN (hypertension)   . Hyperlipemia   . Mild aortic stenosis   . Mild tricuspid regurgitation     Past Surgical History:  Procedure Laterality Date  . ABDOMINAL HYSTERECTOMY    . BUNIONECTOMY    . CARDIAC CATHETERIZATION     NORMAL  . CATARACT EXTRACTION    . CHOLECYSTECTOMY    . EYE SURGERY    . JOINT REPLACEMENT    . PARTIAL HYSTERECTOMY    . TOTAL KNEE ARTHROPLASTY     LEFT    Social History   Tobacco Use  Smoking Status Never Smoker  Smokeless Tobacco Never Used    Social History   Substance and Sexual Activity  Alcohol Use Yes   Comment: occassionally    Family History  Problem Relation Age of Onset  . Intracerebral hemorrhage Father   . Hypertension Father   . Pneumonia Mother   . Mental retardation Sister   . Lung cancer Brother     Reviw of Systems:   Noted in current history, otherwise systems are negative.  Physical Exam: Blood pressure 124/86, pulse 77, height 5\' 4"  (1.626 m), weight 259 lb (117.5 kg), SpO2 98 %.  GEN:   Elderly , obese female,  NAD  HEENT: Normal NECK: No JVD; No carotid bruits LYMPHATICS: No lymphadenopathy CARDIAC:  Irreg. Irreg.  RESPIRATORY:  Clear to auscultation without rales, wheezing or rhonchi  ABDOMEN: Soft, non-tender, non-distended MUSCULOSKELETAL:  No edema; No deformity  SKIN: Warm and dry NEUROLOGIC:  Alert and oriented x 3   ECG: November 25, 2017: Atrial fibrillation with a heart rate of 77.  Low  voltage.  Assessment / Plan:   1. Atrial fibrillation-   Stable,  HR is well controlled.   3. Hypertension -   Well controlled.   3. Hyperlipidemia - managed by primary   4. History chest pain-smooth and normal coronary arteries by cardiac  catheterization    5. Hypothyroidism 6. Arthritis 7. Aortic stenosis - mild  8. Obesity - advised her to get more exercise  -  starting  water aerobics. 9. Fatigue:        Kristeen MissPhilip Nahser, MD  11/25/2017 8:22 AM    [facility] [address],  [address] [place], [place]  [address] Pager [phone] Phone: [phone]; Fax: [phone]

## 2017-11-25 NOTE — Patient Instructions (Signed)
Medication Instructions:  Your physician recommends that you continue on your current medications as directed. Please refer to the Current Medication list given to you today.   Labwork: None Ordered   Testing/Procedures: None Ordered   Follow-Up: Your physician wants you to follow-up in: 1 year with Dr. Nahser.  You will receive a reminder letter in the mail two months in advance. If you don't receive a letter, please call our office to schedule the follow-up appointment.   If you need a refill on your cardiac medications before your next appointment, please call your pharmacy.   Thank you for choosing [facility]! Tamya Denardo, RN [phone]    

## 2017-12-30 ENCOUNTER — Ambulatory Visit (INDEPENDENT_AMBULATORY_CARE_PROVIDER_SITE_OTHER): Payer: Medicare Other | Admitting: *Deleted

## 2017-12-30 DIAGNOSIS — I359 Nonrheumatic aortic valve disorder, unspecified: Secondary | ICD-10-CM | POA: Diagnosis not present

## 2017-12-30 DIAGNOSIS — I4891 Unspecified atrial fibrillation: Secondary | ICD-10-CM

## 2017-12-30 DIAGNOSIS — Z7901 Long term (current) use of anticoagulants: Secondary | ICD-10-CM | POA: Diagnosis not present

## 2017-12-30 DIAGNOSIS — Z5181 Encounter for therapeutic drug level monitoring: Secondary | ICD-10-CM

## 2017-12-30 LAB — POCT INR: INR: 2.7 (ref 2.0–3.0)

## 2017-12-30 NOTE — Patient Instructions (Signed)
Description   Continue same dose 1 tablet daily except 1/2 tablet on Mondays, Wednesdays and Fridays. Call if started on new meds or antibiotics #[phone]. Recheck INR in 6 weeks.    

## 2018-01-12 ENCOUNTER — Other Ambulatory Visit: Payer: Self-pay | Admitting: Cardiovascular Disease

## 2018-02-10 ENCOUNTER — Ambulatory Visit (INDEPENDENT_AMBULATORY_CARE_PROVIDER_SITE_OTHER): Payer: Medicare Other | Admitting: *Deleted

## 2018-02-10 DIAGNOSIS — Z7901 Long term (current) use of anticoagulants: Secondary | ICD-10-CM

## 2018-02-10 DIAGNOSIS — Z5181 Encounter for therapeutic drug level monitoring: Secondary | ICD-10-CM

## 2018-02-10 DIAGNOSIS — I4891 Unspecified atrial fibrillation: Secondary | ICD-10-CM

## 2018-02-10 LAB — POCT INR: INR: 2.9 (ref 2.0–3.0)

## 2018-02-10 NOTE — Patient Instructions (Signed)
Description   Continue same dose 1 tablet daily except 1/2 tablet on Mondays, Wednesdays and Fridays. Call if started on new meds or antibiotics #[phone]. Recheck INR in 6 weeks.    

## 2018-03-23 ENCOUNTER — Ambulatory Visit (INDEPENDENT_AMBULATORY_CARE_PROVIDER_SITE_OTHER): Payer: Medicare Other

## 2018-03-23 DIAGNOSIS — I4891 Unspecified atrial fibrillation: Secondary | ICD-10-CM

## 2018-03-23 DIAGNOSIS — Z5181 Encounter for therapeutic drug level monitoring: Secondary | ICD-10-CM | POA: Diagnosis not present

## 2018-03-23 DIAGNOSIS — Z7901 Long term (current) use of anticoagulants: Secondary | ICD-10-CM | POA: Diagnosis not present

## 2018-03-23 LAB — POCT INR: INR: 2.4 (ref 2.0–3.0)

## 2018-03-23 NOTE — Patient Instructions (Signed)
Description   Continue same dose 1 tablet daily except 1/2 tablet on Mondays, Wednesdays and Fridays. Call if started on new meds or antibiotics #[phone]. Recheck INR in 6 weeks.    

## 2018-04-16 ENCOUNTER — Telehealth: Payer: Self-pay | Admitting: *Deleted

## 2018-04-16 NOTE — Telephone Encounter (Signed)
Patient left a voicemail stating to call her back regarding new meds she has been placed on. Returned call to the pt and she stats she has been placed on amoxicillin clauvulate 875-125mg  tab 1po q 12hrs and methylprednisone 4mg  will take 6tabsx1day, 5tabsx1day, 4tabsx1day, 3 tabsx1day, 2 tabsx1day, then 1 tabx1day  for sinus infection and will start today. Pt advised to have a little more dark green leafy today.

## 2018-04-20 ENCOUNTER — Ambulatory Visit (INDEPENDENT_AMBULATORY_CARE_PROVIDER_SITE_OTHER): Payer: Medicare Other | Admitting: *Deleted

## 2018-04-20 DIAGNOSIS — Z5181 Encounter for therapeutic drug level monitoring: Secondary | ICD-10-CM

## 2018-04-20 DIAGNOSIS — I4891 Unspecified atrial fibrillation: Secondary | ICD-10-CM | POA: Diagnosis not present

## 2018-04-20 DIAGNOSIS — Z7901 Long term (current) use of anticoagulants: Secondary | ICD-10-CM

## 2018-04-20 LAB — POCT INR: INR: 2.3 (ref 2.0–3.0)

## 2018-04-20 NOTE — Patient Instructions (Signed)
Description   Continue same dose 1 tablet daily except 1/2 tablet on Mondays, Wednesdays and Fridays. Call if started on new meds or antibiotics [phone]. Recheck INR in 4 weeks-pending MOHS procedure.

## 2018-05-17 ENCOUNTER — Other Ambulatory Visit: Payer: Self-pay | Admitting: Cardiovascular Disease

## 2018-05-22 ENCOUNTER — Ambulatory Visit (INDEPENDENT_AMBULATORY_CARE_PROVIDER_SITE_OTHER): Payer: Medicare Other | Admitting: *Deleted

## 2018-05-22 DIAGNOSIS — Z7901 Long term (current) use of anticoagulants: Secondary | ICD-10-CM | POA: Diagnosis not present

## 2018-05-22 DIAGNOSIS — I4891 Unspecified atrial fibrillation: Secondary | ICD-10-CM

## 2018-05-22 DIAGNOSIS — Z5181 Encounter for therapeutic drug level monitoring: Secondary | ICD-10-CM

## 2018-05-22 LAB — POCT INR: INR: 2.2 (ref 2.0–3.0)

## 2018-05-22 NOTE — Patient Instructions (Signed)
Description   Continue same dose 1 tablet daily except 1/2 tablet on Mondays, Wednesdays and Fridays. Call if started on new meds or antibiotics [phone]. Recheck INR in 6 weeks-pending MOHS procedure on 05/25/18. Marland Kitchen

## 2018-06-01 ENCOUNTER — Other Ambulatory Visit: Payer: Self-pay | Admitting: Internal Medicine

## 2018-06-01 DIAGNOSIS — Z1231 Encounter for screening mammogram for malignant neoplasm of breast: Secondary | ICD-10-CM

## 2018-06-28 ENCOUNTER — Other Ambulatory Visit: Payer: Self-pay | Admitting: Cardiovascular Disease

## 2018-07-03 ENCOUNTER — Ambulatory Visit (INDEPENDENT_AMBULATORY_CARE_PROVIDER_SITE_OTHER): Payer: Medicare Other | Admitting: Pharmacist

## 2018-07-03 DIAGNOSIS — I4891 Unspecified atrial fibrillation: Secondary | ICD-10-CM | POA: Diagnosis not present

## 2018-07-03 DIAGNOSIS — Z7901 Long term (current) use of anticoagulants: Secondary | ICD-10-CM | POA: Diagnosis not present

## 2018-07-03 DIAGNOSIS — Z5181 Encounter for therapeutic drug level monitoring: Secondary | ICD-10-CM | POA: Diagnosis not present

## 2018-07-03 LAB — POCT INR: INR: 2.5 (ref 2.0–3.0)

## 2018-07-03 NOTE — Patient Instructions (Signed)
Description   Continue same dose 1 tablet daily except 1/2 tablet on Mondays, Wednesdays and Fridays. Call if started on new meds or antibiotics [phone]. Recheck INR in 6 weeks.

## 2018-07-07 ENCOUNTER — Telehealth: Payer: Self-pay | Admitting: *Deleted

## 2018-07-07 NOTE — Telephone Encounter (Signed)
   [facility] Pre-operative Risk Assessment    Request for surgical clearance:  1. What type of surgery is being performed? COLONOSCOPY   2. When is this surgery scheduled? 07/30/18   3. Are there any medications that need to be held prior to surgery and how long? COUMADIN   4. Practice name and name of physician performing surgery? [facility]; DR. Michail Sermon   5. What is your office phone and fax number? PH# [phone]; FAX# [phone]   4. Anesthesia type (None, local, MAC, general) ? PROPOFOL   Julaine Hua 07/07/2018, 9:37 AM  _________________________________________________________________   (provider comments below)

## 2018-07-07 NOTE — Telephone Encounter (Signed)
Pt takes warfarin for afib with CHADS2ASc score of 3 (age, sex, HTN), also has diastolic dysfunction noted but no diagnosis of CHF. Ok to hold warfarin for 5 days prior to procedure.

## 2018-07-08 NOTE — Telephone Encounter (Signed)
   Primary Cardiologist: Kristeen MissPhilip Nahser, MD  Chart reviewed as part of pre-operative protocol coverage. Given past medical history and time since last visit, based on ACC/AHA guidelines, Tammy Robbins would be at acceptable risk for the planned procedure without further cardiovascular testing.   Patient takes warfarin for atrial fibrillation with a CHADS2ASc score of 3 (age, sex, HTN), also has diastolic dysfunction noted but no diagnosis of CHF. Per pharmacy, it will be acceptable to hold warfarin for 5 days prior to colonoscopy procedure.  I will route this recommendation to the requesting party via Epic fax function and remove from pre-op pool.  Please call with questions.  Tammy ChardJill McDaniel, NP 07/08/2018, 3:20 PM

## 2018-07-14 ENCOUNTER — Ambulatory Visit
Admission: RE | Admit: 2018-07-14 | Discharge: 2018-07-14 | Disposition: A | Discharge status: Home / Self Care | Payer: Medicare Other | Source: Ambulatory Visit | Attending: Internal Medicine | Admitting: Internal Medicine

## 2018-07-14 ENCOUNTER — Other Ambulatory Visit: Payer: Self-pay | Admitting: Internal Medicine

## 2018-07-14 ENCOUNTER — Ambulatory Visit (INDEPENDENT_AMBULATORY_CARE_PROVIDER_SITE_OTHER): Payer: Medicare Other | Admitting: *Deleted

## 2018-07-14 DIAGNOSIS — R05 Cough: Secondary | ICD-10-CM

## 2018-07-14 DIAGNOSIS — I4891 Unspecified atrial fibrillation: Secondary | ICD-10-CM | POA: Diagnosis not present

## 2018-07-14 DIAGNOSIS — R059 Cough, unspecified: Secondary | ICD-10-CM

## 2018-07-14 DIAGNOSIS — Z5181 Encounter for therapeutic drug level monitoring: Secondary | ICD-10-CM

## 2018-07-14 DIAGNOSIS — Z7901 Long term (current) use of anticoagulants: Secondary | ICD-10-CM | POA: Diagnosis not present

## 2018-07-14 LAB — POCT INR: INR: 3 (ref 2.0–3.0)

## 2018-07-14 NOTE — Patient Instructions (Signed)
Description   Skip today's dose, then continue same dose 1 tablet daily except 1/2 tablet on Mondays, Wednesdays and Fridays. Call if started on new meds or antibiotics [phone]. Recheck INR in 6 weeks.    Take last dose on 07/24/18 for Colonoscopy on 07/30/18. Resume after per Dr Marge DuncansSchooler's instruction. Usually we boost an extra 1/2 tablet for 2days after procedure along with your normal dosage then continue regular dose.

## 2018-07-15 ENCOUNTER — Encounter: Payer: Self-pay | Admitting: Pulmonary Disease

## 2018-07-15 ENCOUNTER — Ambulatory Visit (INDEPENDENT_AMBULATORY_CARE_PROVIDER_SITE_OTHER): Payer: Medicare Other | Admitting: Pulmonary Disease

## 2018-07-15 VITALS — BP 136/78 | HR 80 | Ht 64.57 in | Wt 265.0 lb

## 2018-07-15 DIAGNOSIS — R0602 Shortness of breath: Secondary | ICD-10-CM | POA: Diagnosis not present

## 2018-07-15 LAB — NITRIC OXIDE: NITRIC OXIDE: 7

## 2018-07-15 MED ORDER — PREDNISONE 10 MG PO TABS: ORAL_TABLET | ORAL | 0 refills | Status: DC

## 2018-07-15 MED ORDER — BUDESONIDE-FORMOTEROL FUMARATE 160-4.5 MCG/ACT IN AERO: 2.0000 | INHALATION_SPRAY | Freq: Two times a day (BID) | RESPIRATORY_TRACT | 0 refills | Status: DC

## 2018-07-15 NOTE — Patient Instructions (Addendum)
We will start him on Symbicort 160 twice daily for 2 to 4 weeks We will need a prednisone taper starting at 40 mg.  Reduce dose by 10 mg every 3 days Albuterol nebs 4 times a day as needed for wheezing.  To treat your postnasal drip use chlorphenirarmine over-the-counter 8 mg 3 times daily and Flonase nasal spray Use Prilosec 20 mg twice daily for acid reflux Follow-up in 1 to 2 weeks.

## 2018-07-15 NOTE — Progress Notes (Signed)
Tammy Robbins    [id number]    [birth year]  Primary Care Physician:Roberts, Windy Fast, MD  Referring Physician: Burton Apley, MD [address] [place], [place] [address]  Chief complaint: Consult for dyspnea, wheezing  HPI: 72 year old never smoker with history of tension, A. fib, aortic stenosis, GERD, hypothyroidism, diastolic dysfunction.  Presenting with cough, wheezing, dyspnea for the past 1 month.  Symptoms started after upper respiratory tract infection worsened during Thanksgiving break.  Seen at primary care and given prednisone taper.  She is also on Keflex for UTI.  She saw primary care yesterday with a chest x-ray which showed no acute lung infiltrate.  Keflex dose was increased.  She has no previous history of lung issues and follows with Dr. Melburn Popper, cardiology.  She had cardiac catheterization in the past with no evidence of coronary artery disease.  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  Outpatient Encounter Medications as of 07/15/2018  Medication Sig  . allopurinol (ZYLOPRIM) 300 MG tablet Take 300 mg by mouth daily.  . carvedilol (COREG) 25 MG tablet TAKE 1 TABLET BY MOUTH TWICE A DAY  . cephALEXin (KEFLEX) 250 MG capsule Take 250 mg by mouth daily.  . chlordiazePOXIDE (LIBRIUM) 10 MG capsule Take 10 mg by mouth daily as needed. As needed for IBS  . Clidinium-Chlordiazepoxide (LIBRAX PO) Take by mouth 2 (two) times daily as needed.   . clotrimazole-betamethasone (LOTRISONE) cream   . estradiol (ESTRACE) 0.1 MG/GM vaginal cream Place 2 g vaginally as needed.   . fish oil-omega-3 fatty acids 1000 MG capsule Take by mouth daily.    . hydrochlorothiazide (HYDRODIURIL) 25 MG tablet Take 1 tablet (25 mg total) by mouth  daily.  Marland Kitchen levothyroxine (SYNTHROID, LEVOTHROID) 75 MCG tablet Take 75 mcg by mouth daily before breakfast.  . meclizine (ANTIVERT) 12.5 MG tablet Take 12.5 mg by mouth 3 (three) times daily as needed for dizziness.  . Multiple Vitamin (MULTIVITAMIN) tablet Take 1 tablet by mouth daily.    . nitroGLYCERIN (NITROSTAT) 0.4 MG SL tablet Place 1 tablet (0.4 mg total) under the tongue every 5 (five) minutes as needed.  Marland Kitchen omeprazole (PRILOSEC) 20 MG capsule Take 20 mg by mouth daily.    . potassium chloride (KLOR-CON 10) 10 MEQ tablet Take 1 tablet (10 mEq total) by mouth 2 (two) times daily.  . simvastatin (ZOCOR) 20 MG tablet Take 20 mg by mouth at bedtime.    Marland Kitchen warfarin (COUMADIN) 5 MG tablet TAKE AS DIRECTED BY COUMADIN CLINIC   No facility-administered encounter medications on file as of 07/15/2018.     Allergies as of 07/15/2018 - Review Complete 07/15/2018  Allergen Reaction Noted  . Diflucan [fluconazole] Rash 06/17/2013    Past Medical History:  Diagnosis Date  . Anxiety   . Atrial fibrillation (HCC)   . Chest tightness   . Diastolic dysfunction   . HTN (hypertension)   . Hyperlipemia   . Mild aortic stenosis   . Mild tricuspid regurgitation     Past Surgical History:  Procedure Laterality Date  . ABDOMINAL HYSTERECTOMY    . BUNIONECTOMY    . CARDIAC CATHETERIZATION     NORMAL  . CATARACT EXTRACTION    . CHOLECYSTECTOMY    . EYE SURGERY    .  JOINT REPLACEMENT    . PARTIAL HYSTERECTOMY    . TOTAL KNEE ARTHROPLASTY     LEFT    Family History  Problem Relation Age of Onset  . Intracerebral hemorrhage Father   . Hypertension Father   . Pneumonia Mother   . Mental retardation Sister   . Lung cancer Brother     Social History   Socioeconomic History  . Marital status: Significant Other    Spouse name: Not on file  . Number of children: Not on file  . Years of education: Not on file  . Highest education level: Not on file  Occupational History  . Not on file    Social Needs  . Financial resource strain: Not on file  . Food insecurity:    Worry: Not on file    Inability: Not on file  . Transportation needs:    Medical: Not on file    Non-medical: Not on file  Tobacco Use  . Smoking status: Never Smoker  . Smokeless tobacco: Never Used  Substance and Sexual Activity  . Alcohol use: Yes    Comment: occassionally  . Drug use: No  . Sexual activity: Never    Birth control/protection: Abstinence  Lifestyle  . Physical activity:    Days per week: Not on file    Minutes per session: Not on file  . Stress: Not on file  Relationships  . Social connections:    Talks on phone: Not on file    Gets together: Not on file    Attends religious service: Not on file    Active member of club or organization: Not on file    Attends meetings of clubs or organizations: Not on file    Relationship status: Not on file  . Intimate partner violence:    Fear of current or ex partner: Not on file    Emotionally abused: Not on file    Physically abused: Not on file    Forced sexual activity: Not on file  Other Topics Concern  . Not on file  Social History Narrative  . Not on file    Review of systems: Review of Systems  Constitutional: Negative for fever and chills.  HENT: Negative.   Eyes: Negative for blurred vision.  Respiratory: as per HPI  Cardiovascular: Negative for chest pain and palpitations.  Gastrointestinal: Negative for vomiting, diarrhea, blood per rectum. Genitourinary: Negative for dysuria, urgency, frequency and hematuria.  Musculoskeletal: Negative for myalgias, back pain and joint pain.  Skin: Negative for itching and rash.  Neurological: Negative for dizziness, tremors, focal weakness, seizures and loss of consciousness.  Endo/Heme/Allergies: Negative for environmental allergies.  Psychiatric/Behavioral: Negative for depression, suicidal ideas and hallucinations.  All other systems reviewed and are negative.  Physical  Exam: Blood pressure 136/78, pulse 80, height 5' 4.57" (1.64 m), weight 265 lb (120.2 kg), SpO2 94 %. Gen:      No acute distress HEENT:  EOMI, sclera anicteric Neck:     No masses; no thyromegaly Lungs:   Bilateral expiratory wheeze, crackles. CV:         Regular rate and rhythm; no murmurs Abd:      + bowel sounds; soft, non-tender; no palpable masses, no distension Ext:    No edema; adequate peripheral perfusion Skin:      Warm and dry; no rash Neuro: alert and oriented x 3  Data Reviewed: Imaging: CT abdomen pelvis 02/08/15- visualized lung bases are clear. Chest x-ray 07/14/2018- clear lungs with  no acute cardiopulmonary abnormality. I reviewed the images personally.  PFTs: FENO 07/15/2018-7  Assessment:  Acute bronchitis with reactive airways Presentation is suggestive of acute persistent bronchitis from viral URTI.  Chest x-ray yesterday does not show any pneumonia.  She is already getting Keflex and will not need additional antibiotics  We will treat her aggressively for bronchospasm with another prednisone taper and sample of Symbicort inhaler She will use albuterol nebulizer 2-4 times as needed for wheezing  GERD, postnasal drip She has significant GERD and postnasal drip which is contributing to ongoing symptoms Try chlorpheniramine and Flonase over-the-counter Prilosec 20 mg over-the-counter twice daily.  Plan/Recommendations: - Samples of Symbicort 160 - Prednisone taper starting at 40 mg.  Reduce dose by 10 mg every 3 days - Albuterol nebs as needed - Chlorpheniramine, Flonase, Prilosec  Chilton Greathouse MD [facility] 07/15/2018, 11:20 AM  CC: Burton Apley, MD

## 2018-07-15 NOTE — Progress Notes (Signed)
HFA demo and teaching for Symbicort 160.

## 2018-07-20 ENCOUNTER — Ambulatory Visit: Payer: Medicare Other

## 2018-07-21 ENCOUNTER — Ambulatory Visit (INDEPENDENT_AMBULATORY_CARE_PROVIDER_SITE_OTHER): Payer: Medicare Other | Admitting: *Deleted

## 2018-07-21 DIAGNOSIS — Z7901 Long term (current) use of anticoagulants: Secondary | ICD-10-CM

## 2018-07-21 DIAGNOSIS — Z5181 Encounter for therapeutic drug level monitoring: Secondary | ICD-10-CM | POA: Diagnosis not present

## 2018-07-21 DIAGNOSIS — I4891 Unspecified atrial fibrillation: Secondary | ICD-10-CM

## 2018-07-21 LAB — POCT INR: INR: 3.2 — AB (ref 2.0–3.0)

## 2018-07-21 NOTE — Patient Instructions (Signed)
Description   Skip today's dose and take 1/2 tablet Wednesday and Thursday, then continue same dose 1 tablet daily except 1/2 tablet on Mondays, Wednesdays and Fridays. Call if started on new meds or antibiotics [phone]. Recheck INR in 1 week. Please update us when you reschedule colonoscopy.

## 2018-07-28 ENCOUNTER — Ambulatory Visit (INDEPENDENT_AMBULATORY_CARE_PROVIDER_SITE_OTHER): Payer: Medicare Other | Admitting: *Deleted

## 2018-07-28 DIAGNOSIS — I4891 Unspecified atrial fibrillation: Secondary | ICD-10-CM

## 2018-07-28 DIAGNOSIS — Z7901 Long term (current) use of anticoagulants: Secondary | ICD-10-CM | POA: Diagnosis not present

## 2018-07-28 DIAGNOSIS — Z5181 Encounter for therapeutic drug level monitoring: Secondary | ICD-10-CM | POA: Diagnosis not present

## 2018-07-28 LAB — POCT INR: INR: 2.3 (ref 2.0–3.0)

## 2018-07-28 NOTE — Patient Instructions (Signed)
Description   Continue taking 1 tablet daily except 1/2 tablet on Mondays, Wednesdays and Fridays. Call if started on new meds or antibiotics [phone]. Recheck INR in 2 weeks. Please update us when you reschedule colonoscopy.

## 2018-07-29 ENCOUNTER — Encounter: Payer: Self-pay | Admitting: Pulmonary Disease

## 2018-07-29 ENCOUNTER — Ambulatory Visit (INDEPENDENT_AMBULATORY_CARE_PROVIDER_SITE_OTHER): Payer: Medicare Other | Admitting: Pulmonary Disease

## 2018-07-29 VITALS — BP 134/78 | HR 86 | Ht 64.0 in | Wt 260.4 lb

## 2018-07-29 DIAGNOSIS — R0602 Shortness of breath: Secondary | ICD-10-CM | POA: Diagnosis not present

## 2018-07-29 MED ORDER — BUDESONIDE-FORMOTEROL FUMARATE 160-4.5 MCG/ACT IN AERO: 2.0000 | INHALATION_SPRAY | Freq: Two times a day (BID) | RESPIRATORY_TRACT | 0 refills | Status: DC

## 2018-07-29 NOTE — Patient Instructions (Signed)
I am glad you are feeling better We can start weaning you off your medications  Reduce your allergy medication to twice a day for a week and then once a day at night for a week and then stop altogether You can stop the steroid nasal spray We will also give you a sample of the inhaler to be used as needed going forward  Please give us a call if there is any worsening of his symptoms and if you need to be seen again.

## 2018-07-29 NOTE — Progress Notes (Signed)
Tammy Robbins    [id number]    29-Jul-1946  Primary Care Physician:Roberts, Windy Fast, MD  Referring Physician: Burton Apley, MD [address] [place], [place] [address]  Chief complaint: Follow up for dyspnea, wheezing  HPI: 72 year old never smoker with history of tension, A. fib, aortic stenosis, GERD, hypothyroidism, diastolic dysfunction.  Presenting with cough, wheezing, dyspnea for the past 1 month.  Symptoms started after upper respiratory tract infection worsened during Thanksgiving break.  Seen at primary care and given prednisone taper.  She is also on Keflex for UTI.  She saw primary care yesterday with a chest x-ray which showed no acute lung infiltrate.  Keflex dose was increased.  She has no previous history of lung issues and follows with Dr. Melburn Popper, cardiology.  She had cardiac catheterization in the past with no evidence of coronary artery disease.  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  Interim history: Given samples of Symbicort.  Pred taper Started on chlorphentermine and Flonase nasal spray at last visit  States that she is feeling 100% better with improvement in symptoms of dyspnea.  She still has some mild postnasal drip but is clear.  Outpatient Encounter Medications as of 07/29/2018  Medication Sig  . allopurinol (ZYLOPRIM) 300 MG tablet Take 300 mg by mouth daily.  . carvedilol (COREG) 25 MG tablet TAKE 1 TABLET BY MOUTH TWICE A DAY  . cephALEXin (KEFLEX) 250 MG capsule Take 250 mg by mouth daily.  . chlordiazePOXIDE (LIBRIUM) 10 MG capsule Take 10 mg by mouth daily as needed. As needed for IBS  . Clidinium-Chlordiazepoxide (LIBRAX PO) Take by mouth 2 (two) times daily as needed.   .  clotrimazole-betamethasone (LOTRISONE) cream   . estradiol (ESTRACE) 0.1 MG/GM vaginal cream Place 2 g vaginally as needed.   . fish oil-omega-3 fatty acids 1000 MG capsule Take by mouth daily.    . hydrochlorothiazide (HYDRODIURIL) 25 MG tablet Take 1 tablet (25 mg total) by mouth daily.  Marland Kitchen levothyroxine (SYNTHROID, LEVOTHROID) 75 MCG tablet Take 75 mcg by mouth daily before breakfast.  . meclizine (ANTIVERT) 12.5 MG tablet Take 12.5 mg by mouth 3 (three) times daily as needed for dizziness.  . Multiple Vitamin (MULTIVITAMIN) tablet Take 1 tablet by mouth daily.    . nitroGLYCERIN (NITROSTAT) 0.4 MG SL tablet Place 1 tablet (0.4 mg total) under the tongue every 5 (five) minutes as needed.  Marland Kitchen omeprazole (PRILOSEC) 20 MG capsule Take 20 mg by mouth daily.    . potassium chloride (KLOR-CON 10) 10 MEQ tablet Take 1 tablet (10 mEq total) by mouth 2 (two) times daily.  . simvastatin (ZOCOR) 20 MG tablet Take 20 mg by mouth at bedtime.    Marland Kitchen warfarin (COUMADIN) 5 MG tablet TAKE AS DIRECTED BY COUMADIN CLINIC  . [DISCONTINUED] predniSONE (DELTASONE) 10 MG tablet 4 tab x 3dys, 3 tab x 3dys, 2 tabs x 3dys, 1 tab x 3dys then stop.  . budesonide-formoterol (SYMBICORT) 160-4.5 MCG/ACT inhaler Inhale 2 puffs into the lungs 2 (two) times daily for 1 day.   No facility-administered encounter medications on file as of 07/29/2018.     Allergies as of 07/29/2018 - Review Complete 07/29/2018  Allergen Reaction Noted  . Diflucan [fluconazole] Rash 06/17/2013    Past  Medical History:  Diagnosis Date  . Anxiety   . Atrial fibrillation (HCC)   . Chest tightness   . Diastolic dysfunction   . HTN (hypertension)   . Hyperlipemia   . Mild aortic stenosis   . Mild tricuspid regurgitation     Past Surgical History:  Procedure Laterality Date  . ABDOMINAL HYSTERECTOMY    . BUNIONECTOMY    . CARDIAC CATHETERIZATION     NORMAL  . CATARACT EXTRACTION    . CHOLECYSTECTOMY    . EYE SURGERY    . JOINT  REPLACEMENT    . PARTIAL HYSTERECTOMY    . TOTAL KNEE ARTHROPLASTY     LEFT    Family History  Problem Relation Age of Onset  . Intracerebral hemorrhage Father   . Hypertension Father   . Pneumonia Mother   . Mental retardation Sister   . Lung cancer Brother     Social History   Socioeconomic History  . Marital status: Significant Other    Spouse name: Not on file  . Number of children: Not on file  . Years of education: Not on file  . Highest education level: Not on file  Occupational History  . Not on file  Social Needs  . Financial resource strain: Not on file  . Food insecurity:    Worry: Not on file    Inability: Not on file  . Transportation needs:    Medical: Not on file    Non-medical: Not on file  Tobacco Use  . Smoking status: Never Smoker  . Smokeless tobacco: Never Used  Substance and Sexual Activity  . Alcohol use: Yes    Comment: occassionally  . Drug use: No  . Sexual activity: Never    Birth control/protection: Abstinence  Lifestyle  . Physical activity:    Days per week: Not on file    Minutes per session: Not on file  . Stress: Not on file  Relationships  . Social connections:    Talks on phone: Not on file    Gets together: Not on file    Attends religious service: Not on file    Active member of club or organization: Not on file    Attends meetings of clubs or organizations: Not on file    Relationship status: Not on file  . Intimate partner violence:    Fear of current or ex partner: Not on file    Emotionally abused: Not on file    Physically abused: Not on file    Forced sexual activity: Not on file  Other Topics Concern  . Not on file  Social History Narrative  . Not on file    Review of systems: Review of Systems  Constitutional: Negative for fever and chills.  HENT: Negative.   Eyes: Negative for blurred vision.  Respiratory: as per HPI  Cardiovascular: Negative for chest pain and palpitations.  Gastrointestinal:  Negative for vomiting, diarrhea, blood per rectum. Genitourinary: Negative for dysuria, urgency, frequency and hematuria.  Musculoskeletal: Negative for myalgias, back pain and joint pain.  Skin: Negative for itching and rash.  Neurological: Negative for dizziness, tremors, focal weakness, seizures and loss of consciousness.  Endo/Heme/Allergies: Negative for environmental allergies.  Psychiatric/Behavioral: Negative for depression, suicidal ideas and hallucinations.  All other systems reviewed and are negative.  Physical Exam: Blood pressure 134/78, pulse 86, height 5\' 4"  (1.626 m), weight 260 lb 6.4 oz (118.1 kg), SpO2 99 %. Gen:      No acute distress HEENT:  EOMI, sclera anicteric Neck:     No masses; no thyromegaly Lungs:    Clear to auscultation bilaterally; normal respiratory effort CV:         Regular rate and rhythm; no murmurs Abd:      + bowel sounds; soft, non-tender; no palpable masses, no distension Ext:    No edema; adequate peripheral perfusion Skin:      Warm and dry; no rash Neuro: alert and oriented x 3 Psych: normal mood and affect  Data Reviewed: Imaging: CT abdomen pelvis 02/08/15- visualized lung bases are clear. Chest x-ray 07/14/2018- clear lungs with no acute cardiopulmonary abnormality. I reviewed the images personally.  PFTs: FENO 07/15/2018-7  Assessment:  Acute bronchitis with reactive airways Presentation is suggestive of acute persistent bronchitis from viral URTI.    Symptoms showing significant improvement Told her to start weaning off the antihistamine and Flonase nasal spray Use Symbicort as needed going forward in case his symptoms recur.  I do not feel she has any underlying lung issues such as asthma.  She can return to pulmonary clinic as needed.  GERD, postnasal drip Prilosec over-the-counter.  Plan/Recommendations: - Wean off chlorpheniramine, Flonase - Prilosec over-the-counter - Symbicort as needed.  Chilton Greathouse MD [facility] 07/29/2018, 3:16 PM  CC: Burton Apley, MD

## 2018-08-11 ENCOUNTER — Ambulatory Visit (INDEPENDENT_AMBULATORY_CARE_PROVIDER_SITE_OTHER): Payer: Medicare Other | Admitting: Pharmacist

## 2018-08-11 DIAGNOSIS — I4891 Unspecified atrial fibrillation: Secondary | ICD-10-CM

## 2018-08-11 DIAGNOSIS — Z7901 Long term (current) use of anticoagulants: Secondary | ICD-10-CM | POA: Diagnosis not present

## 2018-08-11 DIAGNOSIS — Z5181 Encounter for therapeutic drug level monitoring: Secondary | ICD-10-CM

## 2018-08-11 LAB — POCT INR: INR: 2.4 (ref 2.0–3.0)

## 2018-08-11 NOTE — Patient Instructions (Signed)
Description   Continue taking 1 tablet daily except 1/2 tablet on Mondays, Wednesdays and Fridays. Call if started on new meds or antibiotics [phone]. Recheck INR in 5 weeks. Please update us when you reschedule colonoscopy.

## 2018-08-25 ENCOUNTER — Ambulatory Visit
Admission: RE | Admit: 2018-08-25 | Discharge: 2018-08-25 | Disposition: A | Discharge status: Home / Self Care | Payer: Medicare Other | Source: Ambulatory Visit | Attending: Internal Medicine | Admitting: Internal Medicine

## 2018-08-25 DIAGNOSIS — Z1231 Encounter for screening mammogram for malignant neoplasm of breast: Secondary | ICD-10-CM

## 2018-09-21 ENCOUNTER — Other Ambulatory Visit: Payer: Self-pay | Admitting: Pulmonary Disease

## 2018-09-21 ENCOUNTER — Ambulatory Visit (INDEPENDENT_AMBULATORY_CARE_PROVIDER_SITE_OTHER): Payer: Medicare Other

## 2018-09-21 ENCOUNTER — Encounter: Payer: Self-pay | Admitting: Pulmonary Disease

## 2018-09-21 ENCOUNTER — Ambulatory Visit (INDEPENDENT_AMBULATORY_CARE_PROVIDER_SITE_OTHER)
Admission: RE | Admit: 2018-09-21 | Discharge: 2018-09-21 | Disposition: A | Discharge status: Home / Self Care | Payer: Medicare Other | Source: Ambulatory Visit | Attending: Pulmonary Disease | Admitting: Pulmonary Disease

## 2018-09-21 ENCOUNTER — Ambulatory Visit (INDEPENDENT_AMBULATORY_CARE_PROVIDER_SITE_OTHER): Payer: Medicare Other | Admitting: Pulmonary Disease

## 2018-09-21 VITALS — BP 124/66 | HR 82 | Ht 64.0 in | Wt 267.4 lb

## 2018-09-21 DIAGNOSIS — I4891 Unspecified atrial fibrillation: Secondary | ICD-10-CM | POA: Diagnosis not present

## 2018-09-21 DIAGNOSIS — R0602 Shortness of breath: Secondary | ICD-10-CM

## 2018-09-21 DIAGNOSIS — Z7901 Long term (current) use of anticoagulants: Secondary | ICD-10-CM | POA: Diagnosis not present

## 2018-09-21 DIAGNOSIS — Z5181 Encounter for therapeutic drug level monitoring: Secondary | ICD-10-CM | POA: Diagnosis not present

## 2018-09-21 DIAGNOSIS — J181 Lobar pneumonia, unspecified organism: Secondary | ICD-10-CM | POA: Diagnosis not present

## 2018-09-21 LAB — POCT INR: INR: 1.8 — AB (ref 2.0–3.0)

## 2018-09-21 LAB — CBC WITH DIFFERENTIAL/PLATELET
Basophils Absolute: 0 10*3/uL (ref 0.0–0.1)
Basophils Relative: 1.3 % (ref 0.0–3.0)
Eosinophils Absolute: 0.1 10*3/uL (ref 0.0–0.7)
Eosinophils Relative: 4.3 % (ref 0.0–5.0)
HCT: 41.3 % (ref 36.0–46.0)
HEMOGLOBIN: 13.8 g/dL (ref 12.0–15.0)
LYMPHS ABS: 1.5 10*3/uL (ref 0.7–4.0)
LYMPHS PCT: 44.4 % (ref 12.0–46.0)
MCHC: 33.5 g/dL (ref 30.0–36.0)
MCV: 97 fl (ref 78.0–100.0)
MONO ABS: 0.4 10*3/uL (ref 0.1–1.0)
Monocytes Relative: 10.7 % (ref 3.0–12.0)
Neutro Abs: 1.3 10*3/uL — ABNORMAL LOW (ref 1.4–7.7)
Neutrophils Relative %: 39.3 % — ABNORMAL LOW (ref 43.0–77.0)
Platelets: 201 10*3/uL (ref 150.0–400.0)
RBC: 4.26 Mil/uL (ref 3.87–5.11)
RDW: 14.3 % (ref 11.5–15.5)
WBC: 3.3 10*3/uL — ABNORMAL LOW (ref 4.0–10.5)

## 2018-09-21 LAB — NITRIC OXIDE: FENO LEVEL (PPB): 26

## 2018-09-21 MED ORDER — PREDNISONE 10 MG PO TABS: ORAL_TABLET | ORAL | 0 refills | Status: DC

## 2018-09-21 MED ORDER — BUDESONIDE-FORMOTEROL FUMARATE 160-4.5 MCG/ACT IN AERO: 2.0000 | INHALATION_SPRAY | Freq: Two times a day (BID) | RESPIRATORY_TRACT | 3 refills | Status: DC

## 2018-09-21 NOTE — Telephone Encounter (Signed)
Pt currently taking Symbicort 160mg . [facility] has sent alternative request for advair HFA.  Dr. Isaiah Serge please advise if okay to switch. Thanks

## 2018-09-21 NOTE — Progress Notes (Signed)
Tammy Robbins    [id number]    1946-01-03  Primary Care Physician:Roberts, Windy Fast, MD  Referring Physician: Burton Apley, MD [address], [address] [place], [place] [address]  Chief complaint: Follow up for dyspnea, wheezing  HPI: 73 year old never smoker with history of tension, A. fib, aortic stenosis, GERD, hypothyroidism, diastolic dysfunction.  Presenting with cough, wheezing, dyspnea for the past 1 month.  Symptoms started after upper respiratory tract infection worsened during Thanksgiving break.  Seen at primary care and given prednisone taper.  She is also on Keflex for UTI.  She saw primary care yesterday with a chest x-ray which showed no acute lung infiltrate.  Keflex dose was increased.  She has no previous history of lung issues and follows with Dr. Melburn Popper, cardiology.  She had cardiac catheterization in the past with no evidence of coronary artery disease.  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  Interim history: Symptoms improved at last visit and she was told she can stop taking the Symbicort  Her symptoms recurred while she was on a cruise to the [place].  Complains of dyspnea with exercise and at rest, cough with wheezing.  Denies any sick contacts or travel is from [place] on the cruise. She has restarted the Symbicort with minimal improvement in symptoms.  Outpatient Encounter Medications as of 09/21/2018  Medication Sig  . allopurinol (ZYLOPRIM) 300 MG tablet Take 300 mg by mouth daily.  . carvedilol (COREG) 25 MG tablet TAKE 1 TABLET BY MOUTH TWICE A DAY  . cephALEXin (KEFLEX) 250 MG capsule Take 250 mg by mouth daily.  . chlordiazePOXIDE (LIBRIUM) 10 MG capsule Take 10 mg by mouth daily as needed. As needed for IBS    . Clidinium-Chlordiazepoxide (LIBRAX PO) Take by mouth 2 (two) times daily as needed.   . clotrimazole-betamethasone (LOTRISONE) cream   . estradiol (ESTRACE) 0.1 MG/GM vaginal cream Place 2 g vaginally as needed.   . fish oil-omega-3 fatty acids 1000 MG capsule Take by mouth daily.    . hydrochlorothiazide (HYDRODIURIL) 25 MG tablet Take 1 tablet (25 mg total) by mouth daily.  Marland Kitchen levothyroxine (SYNTHROID, LEVOTHROID) 75 MCG tablet Take 75 mcg by mouth daily before breakfast.  . meclizine (ANTIVERT) 12.5 MG tablet Take 12.5 mg by mouth 3 (three) times daily as needed for dizziness.  . Multiple Vitamin (MULTIVITAMIN) tablet Take 1 tablet by mouth daily.    . nitroGLYCERIN (NITROSTAT) 0.4 MG SL tablet Place 1 tablet (0.4 mg total) under the tongue every 5 (five) minutes as needed.  Marland Kitchen omeprazole (PRILOSEC) 20 MG capsule Take 20 mg by mouth daily.    . potassium chloride (KLOR-CON 10) 10 MEQ tablet Take 1 tablet (10 mEq total) by mouth 2 (two) times daily.  . simvastatin (ZOCOR) 20 MG tablet Take 20 mg by mouth at bedtime.    Marland Kitchen warfarin (COUMADIN) 5 MG tablet TAKE AS DIRECTED BY COUMADIN CLINIC  . budesonide-formoterol (SYMBICORT) 160-4.5 MCG/ACT inhaler Inhale 2 puffs into the lungs 2 (two) times daily for 1 day.  . budesonide-formoterol (SYMBICORT) 160-4.5 MCG/ACT inhaler Inhale 2 puffs into the lungs 2 (two) times daily for 1 day.  . [DISCONTINUED] PROAIR HFA 108 (90 Base) MCG/ACT inhaler    No facility-administered encounter medications on file as of  09/21/2018.    Physical Exam: Blood pressure 124/66, pulse 82, height 5\' 4"  (1.626 m), weight 267 lb 6.4 oz (121.3 kg), SpO2 95 %. Gen:      No acute distress HEENT:  EOMI, sclera anicteric Neck:     No masses; no thyromegaly Lungs:    Bilateral expiratory wheeze CV:         Regular rate and rhythm; no murmurs Abd:      + bowel sounds; soft, non-tender; no palpable masses, no distension Ext:    No edema; adequate peripheral perfusion Skin:       Warm and dry; no rash Neuro: alert and oriented x 3 Psych: normal mood and affect  Data Reviewed: Imaging: CT abdomen pelvis 02/08/15- visualized lung bases are clear. Chest x-ray 07/14/2018- clear lungs with no acute cardiopulmonary abnormality. I reviewed the images personally.  PFTs: FENO 07/15/2018-7 FENO 09/21/2018-26  Assessment:  Recurrent bronchitis with reactive airways Initial thought was she has bronchitis from upper respiratory tract infection, postnasal drip Although she improved initially with antihistamine, steroid nasal spray her symptoms have recurred Seems to have mostly upper airway wheezing suggestive of vocal cord dysfunction  We will reassess for asthma.  Check CBC differential, IgE Schedule pulmonary function test Continue Symbicort Give prednisone taper starting at 40 mg.  Reduce dose by 10 mg every 3 days Chest x-ray Continue over-the-counter antihistamine, steroid nasal spray  GERD, postnasal drip Currently on Prilosec.  Increase dose to 20 mg twice daily.  Plan/Recommendations: - CBC, IgE, PFTs, chest x-ray - Continue Symbicort - Prednisone taper - Chest x-ray - Increase Prilosec to 20 mg twice daily  Chilton GreathousePraveen Yohannes Waibel MD [facility] 09/21/2018, 2:44 PM  CC: Burton Apleyoberts, Ronald, MD

## 2018-09-21 NOTE — Patient Instructions (Addendum)
Check CBC  differential, IgE Chest x-ray Continue Symbicort 160/4.5.  We will call in a prescription We have a prednisone taper starting at 40 mg.  Reduce dose by 10 mg every 3 days We will get a chest x-ray today  Increase Prilosec to 20 mg twice daily  Follow-up in 2 to 4 weeks.

## 2018-09-21 NOTE — Patient Instructions (Signed)
Description   Take 1 tablet today, then resume same dosage 1 tablet daily except 1/2 tablet on Mondays, Wednesdays and Fridays. Start holding Coumadin on 10/02/18 prior to colonoscopy on 10/07/18.  Resume Coumadin after procedure as long as MD states ok to do so, take an extra 1/2 tablet x 2 dosages, then resume previous dosage regimen. Recheck in 1 week after procedure. Call if started on new meds or antibiotics [phone].

## 2018-09-22 ENCOUNTER — Telehealth: Payer: Self-pay

## 2018-09-22 ENCOUNTER — Telehealth: Payer: Self-pay | Admitting: Pulmonary Disease

## 2018-09-22 ENCOUNTER — Telehealth: Payer: Self-pay | Admitting: *Deleted

## 2018-09-22 LAB — IGE: IGE (IMMUNOGLOBULIN E), SERUM: 38 kU/L (ref <=114)

## 2018-09-22 MED ORDER — AZITHROMYCIN 250 MG PO TABS: ORAL_TABLET | ORAL | 0 refills | Status: DC

## 2018-09-22 MED ORDER — AMOXICILLIN-POT CLAVULANATE 875-125 MG PO TABS: 1.0000 | ORAL_TABLET | Freq: Two times a day (BID) | ORAL | 0 refills | Status: DC

## 2018-09-22 NOTE — Telephone Encounter (Signed)
I did speak with pt.  Informed her of the recommendations from Dr. Isaiah Serge and verified her pharmacy.  Antibiotic prescriptions sent in to preferred pharmacy.  Nothing further is needed.

## 2018-09-22 NOTE — Telephone Encounter (Signed)
Patient called and stated that she has now been put on Azithromycin 250mg  the pt will take 2 tabs the first day then 1 tab daily to complete the course; a total of 6 tabs.  Pt is aware that sometimes this med can interact with Coumadin but she is okay to eat some green leafy veggies today.  Also, she stated will be on Augmentin 875/125mg  for 7 days then complete. The pt is aware that this med os okay to take with Coumadin.

## 2018-09-22 NOTE — Telephone Encounter (Signed)
Spoke w/ pt.  She reports that she was started on Prednisone taper yesterday by pulmonology, after her INR check. Her INR yesterday was 1.8, she was advised at that time to take extra 1/2 tablet yesterday.  Pt is sched for colonoscopy 2/26 and will start holding coumadin 5 days prior on 10/02/18. Pt took Prednisone 40 mg yesterday and will taper down by 10 mg each day, w/ last dose on Thurs. Advised pt to take 1/2 tablet today, have a serving of greens and then continue previous dosage. Advised her to speak w/ preop regarding her diagnosis and if they will proceed if she is not feeling well.  She verbalizes understanding and is appreciative of the call.

## 2018-09-22 NOTE — Telephone Encounter (Signed)
Notes recorded by Earvin Hansenonnolly, Lisa M, RN on 09/22/2018 at 11:03 AM EST lmtcb x1 pt. Antibiotics sent to preferred pharmacy and chest xray order placed. ------  Notes recorded by Chilton GreathouseMannam, Praveen, MD on 09/22/2018 at 10:51 AM EST Please call and let patient know chest x-ray shows some changes which is suggestive of pneumonia Call in Augmentin 875 mg twice daily for 7 days and a Z-Pak  Order chest x-ray on her scheduled return visit.   Patient is aware of the result nothing further is needed at this time.

## 2018-09-23 ENCOUNTER — Other Ambulatory Visit: Payer: Self-pay

## 2018-09-23 MED ORDER — FLUTICASONE-SALMETEROL 45-21 MCG/ACT IN AERO: 1.0000 | INHALATION_SPRAY | Freq: Two times a day (BID) | RESPIRATORY_TRACT | 3 refills | Status: DC

## 2018-10-07 ENCOUNTER — Telehealth: Payer: Self-pay | Admitting: Pharmacist

## 2018-10-07 NOTE — Telephone Encounter (Signed)
[facility] returned call - ok for pt to resume warfarin on Friday instead of Saturday. Left detailed message for pt to make her aware.

## 2018-10-07 NOTE — Telephone Encounter (Signed)
Pt called clinic to report that her gastroenterologist does not want her to resume her warfarin until this Saturday since she had polyps removed this morning during her colonoscopy. Called over to his office and left message with MD to see if pt can resume warfarin tomorrow or Friday since it will take 5-7 days for her INR to become therapeutic and if she waits until Saturday to resume, she would not be properly anticoagulated for 2 weeks. Will wait for return call and update patient with any changes in her warfarin instructions.

## 2018-10-16 ENCOUNTER — Ambulatory Visit (INDEPENDENT_AMBULATORY_CARE_PROVIDER_SITE_OTHER): Payer: Medicare Other

## 2018-10-16 DIAGNOSIS — I4891 Unspecified atrial fibrillation: Secondary | ICD-10-CM | POA: Diagnosis not present

## 2018-10-16 DIAGNOSIS — Z7901 Long term (current) use of anticoagulants: Secondary | ICD-10-CM | POA: Diagnosis not present

## 2018-10-16 DIAGNOSIS — Z5181 Encounter for therapeutic drug level monitoring: Secondary | ICD-10-CM

## 2018-10-16 LAB — POCT INR: INR: 1.5 — AB (ref 2.0–3.0)

## 2018-10-16 NOTE — Patient Instructions (Signed)
Description   Take 1.5 tablets today, then resume same dosage 1 tablet daily except 1/2 tablet on Mondays, Wednesdays and Fridays.  Recheck in 10 days.  Call if started on new meds or antibiotics [phone].

## 2018-10-20 ENCOUNTER — Ambulatory Visit (INDEPENDENT_AMBULATORY_CARE_PROVIDER_SITE_OTHER): Payer: Medicare Other | Admitting: Pulmonary Disease

## 2018-10-20 ENCOUNTER — Ambulatory Visit (INDEPENDENT_AMBULATORY_CARE_PROVIDER_SITE_OTHER)
Admission: RE | Admit: 2018-10-20 | Discharge: 2018-10-20 | Disposition: A | Discharge status: Home / Self Care | Payer: Medicare Other | Source: Ambulatory Visit | Attending: Pulmonary Disease | Admitting: Pulmonary Disease

## 2018-10-20 ENCOUNTER — Encounter: Payer: Self-pay | Admitting: Pulmonary Disease

## 2018-10-20 VITALS — BP 130/78 | HR 64 | Ht 64.0 in | Wt 267.2 lb

## 2018-10-20 DIAGNOSIS — J181 Lobar pneumonia, unspecified organism: Secondary | ICD-10-CM

## 2018-10-20 DIAGNOSIS — R0602 Shortness of breath: Secondary | ICD-10-CM

## 2018-10-20 NOTE — Patient Instructions (Signed)
I am glad you are feeling better with your breathing Your chest x-ray shows improvement in pneumonia I will follow back with you in 1 to 2 months with pulmonary function test Please call us sooner if there is any change in your symptoms.

## 2018-10-20 NOTE — Progress Notes (Signed)
Tammy Robbins    [id number]    [birth year]  Primary Care Physician:Roberts, Windy Fast, MD  Referring Physician: Burton Apley, MD [address] [place], [place] [address]  Chief complaint: Follow up for dyspnea, wheezing  HPI: 73 year old never smoker with history of tension, A. fib, aortic stenosis, GERD, hypothyroidism, diastolic dysfunction.  Presenting with cough, wheezing, dyspnea for the past 1 month.  Symptoms started after upper respiratory tract infection worsened during Thanksgiving break.  Seen at primary care and given prednisone taper.  She is also on Keflex for UTI.  She saw primary care yesterday with a chest x-ray which showed no acute lung infiltrate.  Keflex dose was increased.  She has no previous history of lung issues and follows with Dr. Melburn Popper, cardiology.  She had cardiac catheterization in the past with no evidence of coronary artery disease.  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  Interim history: Feeling better after antibiotics. States that breathing is back to baseline.  Has mild DOE, no cough, sputum, wheezing.  Outpatient Encounter Medications as of 10/20/2018  Medication Sig  . allopurinol (ZYLOPRIM) 300 MG tablet Take 300 mg by mouth daily.  . carvedilol (COREG) 25 MG tablet TAKE 1 TABLET BY MOUTH TWICE A DAY  . cephALEXin (KEFLEX) 250 MG capsule Take 250 mg by mouth daily.  . chlordiazePOXIDE (LIBRIUM) 10 MG capsule Take 10 mg by mouth daily as needed. As needed for IBS  . Clidinium-Chlordiazepoxide (LIBRAX PO) Take by mouth 2 (two) times daily as needed.   . clotrimazole-betamethasone (LOTRISONE) cream   . estradiol (ESTRACE) 0.1 MG/GM vaginal cream Place 2 g vaginally as needed.   . fish  oil-omega-3 fatty acids 1000 MG capsule Take by mouth daily.    . fluticasone-salmeterol (ADVAIR HFA) 45-21 MCG/ACT inhaler Inhale 1 puff into the lungs 2 (two) times daily.  . hydrochlorothiazide (HYDRODIURIL) 25 MG tablet Take 1 tablet (25 mg total) by mouth daily.  Marland Kitchen levothyroxine (SYNTHROID, LEVOTHROID) 75 MCG tablet Take 75 mcg by mouth daily before breakfast.  . meclizine (ANTIVERT) 12.5 MG tablet Take 12.5 mg by mouth 3 (three) times daily as needed for dizziness.  . Multiple Vitamin (MULTIVITAMIN) tablet Take 1 tablet by mouth daily.    . nitroGLYCERIN (NITROSTAT) 0.4 MG SL tablet Place 1 tablet (0.4 mg total) under the tongue every 5 (five) minutes as needed.  Marland Kitchen omeprazole (PRILOSEC) 20 MG capsule Take 20 mg by mouth daily.    . potassium chloride (KLOR-CON 10) 10 MEQ tablet Take 1 tablet (10 mEq total) by mouth 2 (two) times daily.  . simvastatin (ZOCOR) 20 MG tablet Take 20 mg by mouth at bedtime.    Marland Kitchen warfarin (COUMADIN) 5 MG tablet TAKE AS DIRECTED BY COUMADIN CLINIC  . [DISCONTINUED] amoxicillin-clavulanate (AUGMENTIN) 875-125 MG tablet Take 1 tablet by mouth 2 (two) times daily.  . [DISCONTINUED] azithromycin (ZITHROMAX) 250 MG tablet Take 2 tablets the first day and then 1 tablet daily  . [DISCONTINUED] predniSONE (DELTASONE) 10 MG tablet Take 4 tablets x3 days, 3 tablets x3 days, 2 tablets x3 days, 1 tablet x 3 days.  . budesonide-formoterol (SYMBICORT) 160-4.5 MCG/ACT inhaler Inhale 2 puffs into the lungs 2 (two) times daily for 1 day.   No facility-administered encounter medications  on file as of 10/20/2018.    Physical Exam: Blood pressure 124/66, pulse 82, height 5\' 4"  (1.626 m), weight 267 lb 6.4 oz (121.3 kg), SpO2 95 %. Gen:      No acute distress HEENT:  EOMI, sclera anicteric Neck:     No masses; no thyromegaly Lungs:    Bilateral expiratory wheeze CV:         Regular rate and rhythm; no murmurs Abd:      + bowel sounds; soft, non-tender; no palpable masses, no  distension Ext:    No edema; adequate peripheral perfusion Skin:      Warm and dry; no rash Neuro: alert and oriented x 3 Psych: normal mood and affect  Data Reviewed: Imaging: CT abdomen pelvis 02/08/15- visualized lung bases are clear. Chest x-ray 07/14/2018- clear lungs with no acute cardiopulmonary abnormality. Chest x-ray 09/21/2018- right perihilar infiltrate. Chest x-ray 10/20/2018- resolution of lung infiltrate.Tiny right pleural effusion versus pleural thickening, unchanged.  I reviewed the images personally.  PFTs: FENO 07/15/2018-7 FENO 09/21/2018-26  Labs CBC 09/21/2018-WBC 3.3, eos 4.3%, absolute eosinophil count 142 IgE 09/21/2018-38  Assessment:  Recurrent bronchitis with reactive airways Treated at last visit with antibiotic for right perihilar infiltrate.  Symptoms are better with improvement in CXR.  PFTs are pending to assess asthma.  She may have a component of upper air way wheeze Continue advair for now Continue over-the-counter antihistamine, steroid nasal spray  GERD, postnasal drip Continue prilosec  Plan/Recommendations: - Follow up in 1-2 months with PFTs  Chilton Greathouse MD [facility] 10/20/2018, 9:41 AM  CC: Burton Apley, MD

## 2018-10-26 ENCOUNTER — Ambulatory Visit (INDEPENDENT_AMBULATORY_CARE_PROVIDER_SITE_OTHER): Payer: Medicare Other

## 2018-10-26 ENCOUNTER — Other Ambulatory Visit: Payer: Self-pay

## 2018-10-26 DIAGNOSIS — Z7901 Long term (current) use of anticoagulants: Secondary | ICD-10-CM

## 2018-10-26 DIAGNOSIS — Z5181 Encounter for therapeutic drug level monitoring: Secondary | ICD-10-CM

## 2018-10-26 DIAGNOSIS — I4891 Unspecified atrial fibrillation: Secondary | ICD-10-CM | POA: Diagnosis not present

## 2018-10-26 LAB — POCT INR: INR: 2.6 (ref 2.0–3.0)

## 2018-10-26 NOTE — Patient Instructions (Signed)
Description   Continue on same dosage 1 tablet daily except 1/2 tablet on Mondays, Wednesdays and Fridays.  Recheck in 3 weeks.  Call if started on new meds or antibiotics [phone].

## 2018-11-02 ENCOUNTER — Other Ambulatory Visit: Payer: Self-pay | Admitting: Cardiovascular Disease

## 2018-11-12 ENCOUNTER — Telehealth: Payer: Self-pay

## 2018-11-12 NOTE — Telephone Encounter (Signed)
   Primary Cardiologist:  Kristeen Miss, MD   Patient contacted.  History reviewed.  No symptoms to suggest any unstable cardiac conditions.  Based on discussion, with current pandemic situation, we will be postponing this appointment for Tammy Robbins with a plan for f/u in 6-12 wks or sooner if feasible/necessary.  If symptoms change, she has been instructed to contact our office.   Routing to C19 CANCEL pool for tracking (P CV DIV CV19 CANCEL - reason for visit "other.") and assigning priority (1 = 4-6 wks, 2 = 6-12 wks, 3 = >12 wks).   Ernie Hew, CMA  11/12/2018 3:22 PM         .

## 2018-11-12 NOTE — Telephone Encounter (Signed)

## 2018-11-16 ENCOUNTER — Other Ambulatory Visit: Payer: Self-pay

## 2018-11-16 ENCOUNTER — Ambulatory Visit (INDEPENDENT_AMBULATORY_CARE_PROVIDER_SITE_OTHER): Payer: Medicare Other | Admitting: Pharmacist Clinician (PhC)/ Clinical Pharmacy Specialist

## 2018-11-16 DIAGNOSIS — Z5181 Encounter for therapeutic drug level monitoring: Secondary | ICD-10-CM

## 2018-11-16 DIAGNOSIS — Z7901 Long term (current) use of anticoagulants: Secondary | ICD-10-CM

## 2018-11-16 DIAGNOSIS — I4891 Unspecified atrial fibrillation: Secondary | ICD-10-CM | POA: Diagnosis not present

## 2018-11-16 LAB — POCT INR: INR: 2.3 (ref 2.0–3.0)

## 2018-11-25 ENCOUNTER — Other Ambulatory Visit: Payer: Self-pay | Admitting: Cardiovascular Disease

## 2018-11-26 ENCOUNTER — Ambulatory Visit: Payer: Medicare Other | Admitting: Cardiovascular Disease

## 2018-11-27 ENCOUNTER — Telehealth: Payer: Self-pay | Admitting: Cardiovascular Disease

## 2018-11-27 NOTE — Telephone Encounter (Signed)
Spoke with patient who declined virtual visit with NP and rescheduled her appointment with Dr. Elease Hashimoto for March 17, 2019.

## 2018-12-03 ENCOUNTER — Other Ambulatory Visit: Payer: Self-pay | Admitting: Cardiovascular Disease

## 2018-12-18 ENCOUNTER — Other Ambulatory Visit: Payer: Self-pay | Admitting: Cardiovascular Disease

## 2018-12-22 ENCOUNTER — Ambulatory Visit: Payer: Medicare Other | Admitting: Pulmonary Disease

## 2019-01-06 ENCOUNTER — Other Ambulatory Visit: Payer: Self-pay | Admitting: Cardiovascular Disease

## 2019-01-07 ENCOUNTER — Other Ambulatory Visit: Payer: Self-pay

## 2019-01-07 MED ORDER — WARFARIN SODIUM 5 MG PO TABS: ORAL_TABLET | ORAL | 1 refills | Status: DC

## 2019-01-08 ENCOUNTER — Telehealth: Payer: Self-pay

## 2019-01-08 NOTE — Telephone Encounter (Signed)

## 2019-01-11 ENCOUNTER — Other Ambulatory Visit: Payer: Self-pay

## 2019-01-11 ENCOUNTER — Ambulatory Visit (INDEPENDENT_AMBULATORY_CARE_PROVIDER_SITE_OTHER): Payer: Medicare Other | Admitting: *Deleted

## 2019-01-11 DIAGNOSIS — Z5181 Encounter for therapeutic drug level monitoring: Secondary | ICD-10-CM

## 2019-01-11 DIAGNOSIS — Z7901 Long term (current) use of anticoagulants: Secondary | ICD-10-CM

## 2019-01-11 DIAGNOSIS — I4891 Unspecified atrial fibrillation: Secondary | ICD-10-CM

## 2019-01-11 LAB — POCT INR: INR: 3 (ref 2.0–3.0)

## 2019-01-11 NOTE — Patient Instructions (Signed)
Description   Continue on same dosage 1 tablet daily except 1/2 tablet on Mondays, Wednesdays and Fridays.  Recheck in 4 weeks.  Call if started on new meds or antibiotics [phone].

## 2019-02-03 ENCOUNTER — Telehealth: Payer: Self-pay

## 2019-02-03 NOTE — Telephone Encounter (Signed)

## 2019-02-10 ENCOUNTER — Other Ambulatory Visit: Payer: Self-pay

## 2019-02-10 ENCOUNTER — Ambulatory Visit (INDEPENDENT_AMBULATORY_CARE_PROVIDER_SITE_OTHER): Payer: Medicare Other | Admitting: *Deleted

## 2019-02-10 DIAGNOSIS — Z7901 Long term (current) use of anticoagulants: Secondary | ICD-10-CM

## 2019-02-10 DIAGNOSIS — Z5181 Encounter for therapeutic drug level monitoring: Secondary | ICD-10-CM | POA: Diagnosis not present

## 2019-02-10 DIAGNOSIS — I4891 Unspecified atrial fibrillation: Secondary | ICD-10-CM | POA: Diagnosis not present

## 2019-02-10 LAB — POCT INR: INR: 2.5 (ref 2.0–3.0)

## 2019-02-10 NOTE — Patient Instructions (Signed)
Description   Continue on same dosage 1 tablet daily except 1/2 tablet on Mondays, Wednesdays and Fridays.  Recheck in 5 weeks.  Call if started on new meds or antibiotics [phone].

## 2019-02-26 ENCOUNTER — Other Ambulatory Visit: Payer: Self-pay | Admitting: Cardiovascular Disease

## 2019-03-11 ENCOUNTER — Other Ambulatory Visit: Payer: Self-pay | Admitting: Cardiovascular Disease

## 2019-03-12 ENCOUNTER — Telehealth: Payer: Self-pay | Admitting: Nurse Practitioner

## 2019-03-12 NOTE — Telephone Encounter (Signed)
    Patient's appointment changed from virtual with Dr. Acie Fredrickson on 8/5 to clinic appointment with Richardson Dopp, PA due to patient's preference to keep appointment on same day as coumadin clinic appointment. She agrees to call back if she develops any of the following symptoms:  COVID-19 Pre-Screening Questions:  . In the past 7 to 10 days have you had a cough,  shortness of breath, headache, congestion, fever (100 or greater) body aches, chills, sore throat, or sudden loss of taste or sense of smell? NO . Have you been around anyone with known Covid 19. NO . Have you been around anyone who is awaiting Covid 19 test results in the past 7 to 10 days? NO . Have you been around anyone who has been exposed to Covid 19, or has mentioned symptoms of Covid 19 within the past 7 to 10 days? NO  If you have any concerns/questions about symptoms patients report during screening (either on the phone or at threshold). Contact the provider seeing the patient or DOD for further guidance.  If neither are available contact a member of the leadership team.

## 2019-03-16 NOTE — Progress Notes (Signed)
Cardiology Office Note:    Date:  03/17/2019   ID:  Tammy Robbins, DOB 06-Aug-1946, MRN [id number]  PCP:  Lorene Dy, MD  Cardiologist:  Mertie Moores, MD  Electrophysiologist:  None   Referring MD: Lorene Dy, MD   Chief Complaint  Patient presents with  . Follow-up    Atrial fibrillation    History of Present Illness:    Tammy Robbins is a 73 y.o. female with:  Atrial fibrillation   Coumadin Rx  Hypertension  Hyperlipidemia  Hx or normal cardiac catheterization   Hypothyroidism  Aortic Stenosis  Obesity  Tammy Robbins was last seen by Dr. Acie Fredrickson in 11/2017.  She returns for follow-up.  She is here alone.  She has been doing well.  She has not had chest pain, or significant shortness of breath, syncope.  She has not had any bleeding issues.  She does have a history of snoring.  She notes that she has awoken herself from snoring before.  She has not been tested for sleep apnea.  She does have lower extremity swelling that worsens with prolonged standing.  Prior CV studies:   The following studies were reviewed today:  Echocardiogram 11/2008 Normal LVEF, mod LVH, Gr 2 DD, mild LAE, trace MR, mild TR, mild AS (mean 7.8)  Cardiac catheterization 04/2009 Normal coronary arteries.   Myoview 04/2009 Poss ant ischemia, EF 18  Past Medical History:  Diagnosis Date  . Anxiety   . Atrial fibrillation ([place])   . Chest tightness   . Diastolic dysfunction   . HTN (hypertension)   . Hyperlipemia   . Mild aortic stenosis   . Mild tricuspid regurgitation    Surgical Hx: The patient  has a past surgical history that includes Cardiac catheterization; Cataract extraction; Total knee arthroplasty; Partial hysterectomy; Bunionectomy; Cholecystectomy; Abdominal hysterectomy; Eye surgery; and Joint replacement.   Current Medications: Current Meds  Medication Sig  . allopurinol (ZYLOPRIM) 300 MG tablet Take 300 mg by mouth daily.  . carvedilol (COREG) 25 MG tablet Take 25 mg  by mouth 2 (two) times daily with a meal.  . cephALEXin (KEFLEX) 250 MG capsule Take 250 mg by mouth daily.  . Clidinium-Chlordiazepoxide (LIBRAX PO) Take by mouth 2 (two) times daily as needed.   . fish oil-omega-3 fatty acids 1000 MG capsule Take by mouth daily.    . hydrochlorothiazide (HYDRODIURIL) 25 MG tablet TAKE 1 TABLET BY MOUTH DAILY. PLEASE KEEP APPT FOR FUTURE REFILLS.  Marland Kitchen levothyroxine (SYNTHROID, LEVOTHROID) 75 MCG tablet Take 75 mcg by mouth daily before breakfast.  . meclizine (ANTIVERT) 12.5 MG tablet Take 12.5 mg by mouth 3 (three) times daily as needed for dizziness.  . Multiple Vitamin (MULTIVITAMIN) tablet Take 1 tablet by mouth daily.    . nitroGLYCERIN (NITROSTAT) 0.4 MG SL tablet Place 1 tablet (0.4 mg total) under the tongue every 5 (five) minutes as needed.  Marland Kitchen omeprazole (PRILOSEC) 20 MG capsule Take 20 mg by mouth daily.    . potassium chloride (K-DUR) 10 MEQ tablet Take 1 tablet (10 mEq total) by mouth 2 (two) times daily. Pt must keep upcoming appt in aug to get further refills  . simvastatin (ZOCOR) 20 MG tablet Take 20 mg by mouth at bedtime.    Marland Kitchen warfarin (COUMADIN) 5 MG tablet TAKE AS DIRECTED BY COUMADIN CLINIC     Allergies:   Diflucan [fluconazole]   Social History   Tobacco Use  . Smoking status: Never Smoker  . Smokeless tobacco: Never Used  Substance Use Topics  . Alcohol use: Yes    Comment: occassionally  . Drug use: No     Family Hx: The patient's family history includes Hypertension in her father; Intracerebral hemorrhage in her father; Lung cancer in her brother; Mental retardation in her sister; Pneumonia in her mother.  ROS:   Please see the history of present illness.    ROS All other systems reviewed and are negative.   EKGs/Labs/Other Test Reviewed:    EKG:  EKG is  ordered today.  The ekg ordered today demonstrates atrial fibrillation, HR 82, normal axis, low voltage, QTC 448, no change from prior tracing  Recent Labs:  09/21/2018: Hemoglobin 13.8; Platelets 201.0   Recent Lipid Panel Lab Results  Component Value Date/Time   CHOL 132 08/18/2014 08:21 AM   TRIG 132.0 08/18/2014 08:21 AM   HDL 35.70 (L) 08/18/2014 08:21 AM   CHOLHDL 4 08/18/2014 08:21 AM   LDLCALC 70 08/18/2014 08:21 AM    Physical Exam:    VS:  BP 122/84   Pulse 82   Ht 5\' 4"  (1.626 m)   Wt 274 lb (124.3 kg)   BMI 47.03 kg/m     Wt Readings from Last 3 Encounters:  03/17/19 274 lb (124.3 kg)  10/20/18 267 lb 3.2 oz (121.2 kg)  09/21/18 267 lb 6.4 oz (121.3 kg)     Physical Exam  Constitutional: She is oriented to person, place, and time. She appears well-developed and well-nourished. No distress.  HENT:  Head: Normocephalic and atraumatic.  Eyes: No scleral icterus.  Neck: No JVD present. No thyromegaly present.  Cardiovascular: Normal rate and normal heart sounds. An irregularly irregular rhythm present.  No murmur heard. Pulmonary/Chest: Effort normal and breath sounds normal. She has no rales.  Abdominal: Soft. There is no hepatomegaly.  Musculoskeletal:        General: Edema (trace-1+ bilat edema; multiple varicose veins noted) present.  Lymphadenopathy:    She has no cervical adenopathy.  Neurological: She is alert and oriented to person, place, and time.  Skin: Skin is warm and dry.  Psychiatric: She has a normal mood and affect.    ASSESSMENT & PLAN:    1. Permanent atrial fibrillation Heart rate is well controlled.  She is tolerating anticoagulation.  Coumadin clinic follow-up will be obtained today.  Obtain most recent labs from primary care.  Follow-up 1 year.    2. Essential hypertension The patient's blood pressure is controlled on her current regimen.  Continue current therapy.   3. Snoring She does have a history of snoring and with her history of atrial fibrillation, I have recommended testing for sleep apnea.  Arrange home sleep test.   Dispo:  Return in about 1 year (around 03/16/2020) for  Routine Follow Up, w/ Dr. Elease HashimotoNahser, or Tereso NewcomerScott Sharon Rubis, PA-C.   Medication Adjustments/Labs and Tests Ordered: Current medicines are reviewed at length with the patient today.  Concerns regarding medicines are outlined above.  Tests Ordered: Orders Placed This Encounter  Procedures  . Ambulatory referral to Sleep Studies  . EKG 12-Lead   Medication Changes: No orders of the defined types were placed in this encounter.   Signed, Tereso NewcomerScott Bunnie Rehberg, PA-C  03/17/2019 9:49 AM    [facility] [address], [place], [place]  [address] Phone: [phone]; Fax: [phone]

## 2019-03-17 ENCOUNTER — Encounter: Payer: Self-pay | Admitting: Physician Assistant

## 2019-03-17 ENCOUNTER — Ambulatory Visit: Payer: Medicare Other | Admitting: Cardiovascular Disease

## 2019-03-17 ENCOUNTER — Ambulatory Visit (INDEPENDENT_AMBULATORY_CARE_PROVIDER_SITE_OTHER): Payer: Medicare Other | Admitting: Physician Assistant

## 2019-03-17 ENCOUNTER — Other Ambulatory Visit: Payer: Self-pay

## 2019-03-17 ENCOUNTER — Telehealth: Payer: Self-pay | Admitting: *Deleted

## 2019-03-17 ENCOUNTER — Ambulatory Visit (INDEPENDENT_AMBULATORY_CARE_PROVIDER_SITE_OTHER): Payer: Medicare Other | Admitting: Pharmacist

## 2019-03-17 VITALS — BP 122/84 | HR 82 | Ht 64.0 in | Wt 274.0 lb

## 2019-03-17 DIAGNOSIS — R0683 Snoring: Secondary | ICD-10-CM | POA: Diagnosis not present

## 2019-03-17 DIAGNOSIS — I10 Essential (primary) hypertension: Secondary | ICD-10-CM

## 2019-03-17 DIAGNOSIS — I4891 Unspecified atrial fibrillation: Secondary | ICD-10-CM

## 2019-03-17 DIAGNOSIS — Z5181 Encounter for therapeutic drug level monitoring: Secondary | ICD-10-CM | POA: Diagnosis not present

## 2019-03-17 DIAGNOSIS — I4821 Permanent atrial fibrillation: Secondary | ICD-10-CM

## 2019-03-17 DIAGNOSIS — Z7901 Long term (current) use of anticoagulants: Secondary | ICD-10-CM

## 2019-03-17 LAB — POCT INR: INR: 1.9 — AB (ref 2.0–3.0)

## 2019-03-17 NOTE — Telephone Encounter (Signed)
S/w Maxine at Dr. Mancel Bale office @ [phone] to fax pt's lab results to [facility]. PA  from October.

## 2019-03-17 NOTE — Telephone Encounter (Signed)
Staff message sent to Tammy Robbins ok to schedule sleep study. Patient has Medicare and does not require getting a PA. 

## 2019-03-17 NOTE — Patient Instructions (Signed)
Description   Take 1 tablet today, then continue on same dosage 1 tablet daily except 1/2 tablet on Mondays, Wednesdays and Fridays.  Recheck in 4 weeks.  Call if started on new meds or antibiotics [phone].

## 2019-03-17 NOTE — Telephone Encounter (Signed)
-----   Message from Tamsen Snider sent at 03/17/2019  9:18 AM EDT ----- Patient needs a home sleep study for a-fib and snoring.  Please call to schedule.   Thanks danielle

## 2019-03-17 NOTE — Patient Instructions (Signed)
Medication Instructions:  Your physician recommends that you continue on your current medications as directed. Please refer to the Current Medication list given to you today.   Labwork: -None  Testing/Procedures: Your physician has recommended that you have a sleep study. This test records several body functions during sleep, including: brain activity, eye movement, oxygen and carbon dioxide blood levels, heart rate and rhythm, breathing rate and rhythm, the flow of air through your mouth and nose, snoring, body muscle movements, and chest and belly movement.    Follow-Up: Your physician wants you to follow-up in: 1 year with Dr.Nahser. You will receive a reminder letter in the mail two months in advance. If you don't receive a letter, please call our office to schedule the follow-up appointment.   Any Other Special Instructions Will Be Listed Below (If Applicable).     If you need a refill on your cardiac medications before your next appointment, please call your pharmacy.

## 2019-03-21 ENCOUNTER — Other Ambulatory Visit: Payer: Self-pay | Admitting: Cardiovascular Disease

## 2019-03-23 ENCOUNTER — Telehealth: Payer: Self-pay | Admitting: *Deleted

## 2019-03-23 ENCOUNTER — Other Ambulatory Visit: Payer: Self-pay | Admitting: *Deleted

## 2019-03-23 DIAGNOSIS — R0683 Snoring: Secondary | ICD-10-CM

## 2019-03-23 DIAGNOSIS — I10 Essential (primary) hypertension: Secondary | ICD-10-CM

## 2019-03-23 NOTE — Telephone Encounter (Signed)
-----   Message from Lauralee Evener, [place] sent at 03/17/2019 10:05 AM EDT ----- Ok to schedule sleep study. Patient has medicare and does not require a PA. ----- Message ----- From: Tamsen Snider Sent: 03/17/2019   9:18 AM EDT To: [facility] Sleep Studies  Patient needs a home sleep study for a-fib and snoring.  Please call to schedule.   Thanks danielle

## 2019-03-25 NOTE — Telephone Encounter (Signed)
Patient is aware and agreeable to Home Sleep Study through [facility]. Patient is scheduled for 10/21 at 10 am to pick up home sleep kit and meet with Respiratory therapist at [facility]. Patient is aware that if this appointment date and time does not work for them they should contact Artis Delay directly at [phone]. Patient is aware that a sleep packet will be sent from [facility] in week. Patient is agreeable to treatment and thankful for call.

## 2019-03-30 ENCOUNTER — Other Ambulatory Visit: Payer: Self-pay | Admitting: Pulmonary Disease

## 2019-04-01 ENCOUNTER — Other Ambulatory Visit: Payer: Self-pay | Admitting: Cardiovascular Disease

## 2019-04-08 ENCOUNTER — Other Ambulatory Visit (HOSPITAL_COMMUNITY)
Admission: RE | Admit: 2019-04-08 | Discharge: 2019-04-08 | Disposition: A | Discharge status: Home / Self Care | Payer: Medicare Other | Source: Ambulatory Visit | Attending: Pulmonary Disease | Admitting: Pulmonary Disease

## 2019-04-08 DIAGNOSIS — Z01812 Encounter for preprocedural laboratory examination: Secondary | ICD-10-CM | POA: Insufficient documentation

## 2019-04-08 DIAGNOSIS — Z20828 Contact with and (suspected) exposure to other viral communicable diseases: Secondary | ICD-10-CM | POA: Insufficient documentation

## 2019-04-08 LAB — SARS CORONAVIRUS 2 (TAT 6-24 HRS): SARS Coronavirus 2: NEGATIVE

## 2019-04-12 ENCOUNTER — Encounter: Payer: Self-pay | Admitting: Pulmonary Disease

## 2019-04-12 ENCOUNTER — Ambulatory Visit (INDEPENDENT_AMBULATORY_CARE_PROVIDER_SITE_OTHER): Payer: Medicare Other | Admitting: Pulmonary Disease

## 2019-04-12 ENCOUNTER — Ambulatory Visit: Payer: Medicare Other | Admitting: Pulmonary Disease

## 2019-04-12 ENCOUNTER — Other Ambulatory Visit: Payer: Self-pay

## 2019-04-12 VITALS — BP 120/72 | HR 78 | Temp 97.2°F | Ht 64.0 in | Wt 277.0 lb

## 2019-04-12 DIAGNOSIS — Z0000 Encounter for general adult medical examination without abnormal findings: Secondary | ICD-10-CM | POA: Diagnosis not present

## 2019-04-12 DIAGNOSIS — J984 Other disorders of lung: Secondary | ICD-10-CM

## 2019-04-12 DIAGNOSIS — R0602 Shortness of breath: Secondary | ICD-10-CM | POA: Diagnosis not present

## 2019-04-12 DIAGNOSIS — Z9189 Other specified personal risk factors, not elsewhere classified: Secondary | ICD-10-CM | POA: Insufficient documentation

## 2019-04-12 DIAGNOSIS — K219 Gastro-esophageal reflux disease without esophagitis: Secondary | ICD-10-CM

## 2019-04-12 DIAGNOSIS — J4520 Mild intermittent asthma, uncomplicated: Secondary | ICD-10-CM | POA: Diagnosis not present

## 2019-04-12 LAB — PULMONARY FUNCTION TEST
DL/VA % pred: 114 %
DL/VA: 4.73 ml/min/mmHg/L
DLCO unc % pred: 92 %
DLCO unc: 17.79 ml/min/mmHg
FEF 25-75 Post: 1.78 L/sec
FEF 25-75 Pre: 1.1 L/sec
FEF2575-%Change-Post: 62 %
FEF2575-%Pred-Post: 99 %
FEF2575-%Pred-Pre: 61 %
FEV1-%Change-Post: 12 %
FEV1-%Pred-Post: 72 %
FEV1-%Pred-Pre: 64 %
FEV1-Post: 1.58 L
FEV1-Pre: 1.4 L
FEV1FVC-%Change-Post: 4 %
FEV1FVC-%Pred-Pre: 100 %
FEV6-%Change-Post: 7 %
FEV6-%Pred-Post: 72 %
FEV6-%Pred-Pre: 66 %
FEV6-Post: 1.99 L
FEV6-Pre: 1.84 L
FEV6FVC-%Pred-Post: 104 %
FEV6FVC-%Pred-Pre: 104 %
FVC-%Change-Post: 7 %
FVC-%Pred-Post: 68 %
FVC-%Pred-Pre: 63 %
FVC-Post: 1.99 L
FVC-Pre: 1.84 L
Post FEV1/FVC ratio: 80 %
Post FEV6/FVC ratio: 100 %
Pre FEV1/FVC ratio: 76 %
Pre FEV6/FVC Ratio: 100 %
RV % pred: 91 %
RV: 2.02 L
TLC % pred: 81 %
TLC: 4.1 L

## 2019-04-12 NOTE — Patient Instructions (Addendum)
You were seen today by Lauraine Rinne, NP  for:   1. Mild intermittent asthma without complication  Trial of Symbicort 80 >>> 2 puffs in the morning right when you wake up, rinse out your mouth after use, 12 hours later 2 puffs, rinse after use >>> Take this daily, no matter what >>> This is not a rescue inhaler   Contact us in 1 week and let us know how you are doing on this inhaler, if you are tolerating it well and doing well on it then we can place a prescription  Only use your albuterol as a rescue medication to be used if you can't catch your breath by resting or doing a relaxed purse lip breathing pattern.  - The less you use it, the better it will work when you need it. - Ok to use up to 2 puffs  every 4 hours if you must but call for immediate appointment if use goes up over your usual need - Don't leave home without it !!  (think of it like the spare tire for your car)   Continue daily allergy pill  Continue to increase daily physical activity  We recommend that you receive your flu vaccine  2. Gastroesophageal reflux disease, esophagitis presence not specified  Omeprazole 20 mg tablet  >>>Please take 1 tablet daily 15 minutes to 30 minutes before your first meal of the day as well as before your other medications >>>Try to take at the same time each day >>>take this medication daily  GERD management: >>>Avoid laying flat until 2 hours after meals >>>Elevate head of the bed including entire chest >>>Reduce size of meals and amount of fat, acid, spices, caffeine and sweets >>>If you are smoking, Please stop! >>>Decrease alcohol consumption >>>Work on maintaining a healthy weight with normal BMI    3. Restrictive lung disease  This is shown on your pulmonary function test today  Work on increasing daily physical activity  4. Healthcare maintenance  We recommend that you receive your high-dose flu vaccine, if [facility] would [facility] that is perfectly  okay  We will request records from your primary care for your pneumonia vaccine  Complete follow-up with cardiology for your home sleep study to rule out obstructive sleep apnea   Follow Up:    Return in about 2 months (around 06/12/2019), or if symptoms worsen or fail to improve, for Follow up with Dr. Vaughan Browner, Follow up with Wyn Quaker FNP-C.   Please do your part to reduce the spread of COVID-19:      Reduce your risk of any infection  and COVID19 by using the similar precautions used for avoiding the common cold or flu:  Marland Kitchen Wash your hands often with soap and warm water for at least 20 seconds.  If soap and water are not readily available, use an alcohol-based hand sanitizer with at least 60% alcohol.  . If coughing or sneezing, cover your mouth and nose by coughing or sneezing into the elbow areas of your shirt or coat, into a tissue or into your sleeve (not your hands). Langley Gauss A MASK when in public  . Avoid shaking hands with others and consider head nods or verbal greetings only. . Avoid touching your eyes, nose, or mouth with unwashed hands.  . Avoid close contact with people who are sick. . Avoid places or events with large numbers of people in one location, like concerts or sporting events. . If you have  some symptoms but not all symptoms, continue to monitor at home and seek medical attention if your symptoms worsen. . If you are having a medical emergency, call 911.   ADDITIONAL HEALTHCARE OPTIONS FOR PATIENTS  [facility] / e-Visit: [email or web address]         [facility]: [phone]  [facility]: [phone]                   [facility]: [phone]     It is flu season:   >>> Best ways to protect herself from the flu: Receive the yearly flu vaccine, practice good hand hygiene washing with soap and also using hand sanitizer when available, eat a nutritious meals, get  adequate rest, hydrate appropriately   Please contact the office if your symptoms worsen or you have concerns that you are not improving.   Thank you for choosing [facility] for your healthcare, and for allowing us to partner with you on your healthcare journey. I am thankful to be able to provide care to you today.   Elisha HeadlandBrian Tonilynn Bieker FNP-C

## 2019-04-12 NOTE — Assessment & Plan Note (Signed)
Plan: Trial of Symbicort 80 Continue rescue inhaler as needed Work on increasing daily physical activity Continue daily allergy pill Continue PPI GERD prophylaxis Follow-up with our office in 6 to 8 weeks Recommended patient receive flu vaccine, declined today, [facility]

## 2019-04-12 NOTE — Assessment & Plan Note (Signed)
Plan: Continue omeprazole 

## 2019-04-12 NOTE — Progress Notes (Signed)
PFT done today. 

## 2019-04-12 NOTE — Assessment & Plan Note (Signed)
Plan: Complete scheduled home sleep study managed by cardiology

## 2019-04-12 NOTE — Assessment & Plan Note (Signed)
Plan: Recommended flu vaccine today, patient declined We will request records from primary care office for pneumonia vaccines

## 2019-04-12 NOTE — Progress Notes (Signed)
 @Patient ID: Tammy Robbins, female    DOB: 01/02/1946, 73 y.o.   MRN: [id number]  Chief Complaint  Patient presents with  . Follow-up    PFT    Referring provider: Roberts, Ronald, MD  HPI:  73-year-old female never smoker followed in our office for  PMH: Hypertension, A. fib, anxiety, GERD, hypothyroidism, IBS Smoker/ Smoking History: Never smoker Maintenance: None Pt of: Dr. Mannam  04/12/2019  - Visit   73-year-old female never smoker followed in our office for potential upper airway cough syndrome or suspected asthma.  Patient is completing pulmonary function test today.  Patient also had a pneumonia earlier this year.  This was cleared on chest x-ray March/2020.  Patient reports that she is not maintained on a maintenance inhaler and has not had to use her rescue inhaler at all.  Patient completed pulmonary function test today.  04/12/2019-pulmonary function test-FVC 1.84 (63% predicted), postbronchodilator ratio 80, postbronchodilator FEV1 1.58 (72% predicted), positive bronchodilator response in the FEV1, positive mid flow reversibility, DLCO 17.79 (92% predicted)  Patient reports she has been doing well.  Struggling with being cooped up at home and increasing daily physical activity.  Patient would not like the flu vaccine today.  [facility]  Patient reports that she has received her pneumonia vaccine already from her primary care provider.  We can request these records today.   Tests:   04/08/2019-SARS-CoV-2-negative  09/21/2018-IgE-38 09/21/2018-CBC with differential- eosinophils relative 4.3, eosinophils absolute 0.1  CT abdomen pelvis 02/08/15- visualized lung bases are clear. Chest x-ray 07/14/2018- clear lungs with no acute cardiopulmonary abnormality. Chest x-ray 09/21/2018- right perihilar infiltrate 10/20/2018-chest x-ray- resolution of pulmonary infiltrates, tiny right pleural effusion versus pleural thickening unchanged   04/12/2019-pulmonary function test-FVC 1.84 (63% predicted), postbronchodilator ratio 80, postbronchodilator FEV1 1.58 (72% predicted), positive bronchodilator response in the FEV1, positive mid flow reversibility, DLCO 17.79 (92% predicted)   FENO:  Lab Results  Component Value Date   NITRICOXIDE 7 07/15/2018    PFT: PFT Results Latest Ref Rng & Units 04/12/2019  FVC-Pre L 1.84  FVC-Predicted Pre % 63  FVC-Post L 1.99  FVC-Predicted Post % 68  Pre FEV1/FVC % % 76  Post FEV1/FCV % % 80  FEV1-Pre L 1.40  FEV1-Predicted Pre % 64  FEV1-Post L 1.58  DLCO UNC% % 92  DLCO COR %Predicted % 114  TLC L 4.10  TLC % Predicted % 81  RV % Predicted % 91    Imaging: No results found.    Specialty Problems      Pulmonary Problems   Mild intermittent asthma without complication    09/21/2018-IgE-38 09/21/2018-CBC with differential- eosinophils relative 4.3, eosinophils absolute 0.1  04/12/2019-pulmonary function test-FVC 1.84 (63% predicted), postbronchodilator ratio 80, postbronchodilator FEV1 1.58 (72% predicted), positive bronchodilator response in the FEV1, positive mid flow reversibility, DLCO 17.79 (92% predicted)  ?  Th2 low asthma      Restrictive lung disease    04/12/2019-pulmonary function test-FVC 1.84 (63% predicted), postbronchodilator ratio 80, postbronchodilator FEV1 1.58 (72% predicted), positive bronchodilator response in the FEV1, positive mid flow reversibility, DLCO 17.79 (92% predicted)          Allergies  Allergen Reactions  . Diflucan [Fluconazole] Rash    Immunization History  Administered Date(s) Administered  . Influenza, High Dose Seasonal PF 05/10/2018  . Influenza, Seasonal, Injecte, Preservative Fre 03/12/2013  . Influenza-Unspecified 03/12/2013   High dose flu vaccine   Records need to be requested from   PCP for pnuemonia vaccine   Past Medical History:  Diagnosis Date  . Anxiety   . Atrial fibrillation ([place])   . Chest tightness   .  Diastolic dysfunction   . HTN (hypertension)   . Hyperlipemia   . Mild aortic stenosis   . Mild tricuspid regurgitation     Tobacco History: Social History   Tobacco Use  Smoking Status Never Smoker  Smokeless Tobacco Never Used   Counseling given: Yes  Continue to not smoke  Outpatient Encounter Medications as of 04/12/2019  Medication Sig  . allopurinol (ZYLOPRIM) 300 MG tablet Take 300 mg by mouth daily.  . carvedilol (COREG) 25 MG tablet Take 1 tablet (25 mg total) by mouth 2 (two) times daily.  . cephALEXin (KEFLEX) 250 MG capsule Take 250 mg by mouth daily.  . Clidinium-Chlordiazepoxide (LIBRAX PO) Take by mouth 2 (two) times daily as needed.   . fish oil-omega-3 fatty acids 1000 MG capsule Take by mouth daily.    . hydrochlorothiazide (HYDRODIURIL) 25 MG tablet Take 1 tablet (25 mg total) by mouth daily.  Marland Kitchen levothyroxine (SYNTHROID, LEVOTHROID) 75 MCG tablet Take 75 mcg by mouth daily before breakfast.  . meclizine (ANTIVERT) 12.5 MG tablet Take 12.5 mg by mouth 3 (three) times daily as needed for dizziness.  . Multiple Vitamin (MULTIVITAMIN) tablet Take 1 tablet by mouth daily.    . nitroGLYCERIN (NITROSTAT) 0.4 MG SL tablet Place 1 tablet (0.4 mg total) under the tongue every 5 (five) minutes as needed.  Marland Kitchen omeprazole (PRILOSEC) 20 MG capsule Take 20 mg by mouth daily.    . potassium chloride (K-DUR) 10 MEQ tablet Take 1 tablet (10 mEq total) by mouth 2 (two) times daily. Pt must keep upcoming appt in aug to get further refills  . simvastatin (ZOCOR) 20 MG tablet Take 20 mg by mouth at bedtime.    Marland Kitchen warfarin (COUMADIN) 5 MG tablet TAKE AS DIRECTED BY COUMADIN CLINIC   No facility-administered encounter medications on file as of 04/12/2019.      Review of Systems  Review of Systems  Constitutional: Negative for activity change, fatigue and fever.  HENT: Positive for postnasal drip and rhinorrhea. Negative for sinus pressure, sinus pain, sore throat and trouble  swallowing.   Respiratory: Positive for wheezing (with exertion occasionally, laying down ). Negative for cough and shortness of breath.   Cardiovascular: Negative for chest pain and palpitations.  Gastrointestinal: Negative for diarrhea, nausea and vomiting.  Musculoskeletal: Negative for arthralgias.  Neurological: Negative for dizziness.  Psychiatric/Behavioral: Negative for sleep disturbance. The patient is not nervous/anxious.      Physical Exam  BP 120/72   Pulse 78   Temp (!) 97.2 F (36.2 C) (Temporal)   Ht 5' 4" (1.626 m)   Wt 277 lb (125.6 kg)   SpO2 98%   BMI 47.55 kg/m   Wt Readings from Last 5 Encounters:  04/12/19 277 lb (125.6 kg)  03/17/19 274 lb (124.3 kg)  10/20/18 267 lb 3.2 oz (121.2 kg)  09/21/18 267 lb 6.4 oz (121.3 kg)  07/29/18 260 lb 6.4 oz (118.1 kg)     Physical Exam Vitals signs and nursing note reviewed.  Constitutional:      General: She is not in acute distress.    Appearance: Normal appearance. She is obese.  HENT:     Head: Normocephalic and atraumatic.     Right Ear: Tympanic membrane, ear canal and external ear normal. There is no impacted cerumen.  Left Ear: Tympanic membrane, ear canal and external ear normal. There is no impacted cerumen.     Nose: Congestion and rhinorrhea present.     Mouth/Throat:     Mouth: Mucous membranes are dry.     Pharynx: Oropharynx is clear.     Comments: Mallampati 3 Eyes:     Pupils: Pupils are equal, round, and reactive to light.  Neck:     Musculoskeletal: Normal range of motion.  Cardiovascular:     Rate and Rhythm: Normal rate and regular rhythm.     Pulses: Normal pulses.     Heart sounds: Normal heart sounds. No murmur.  Pulmonary:     Effort: Pulmonary effort is normal. No respiratory distress.     Breath sounds: Normal breath sounds. No decreased air movement. No decreased breath sounds, wheezing or rales.  Abdominal:     General: Abdomen is flat. Bowel sounds are normal.      Palpations: Abdomen is soft.  Musculoskeletal:     Right lower leg: No edema.     Left lower leg: No edema.  Skin:    General: Skin is warm and dry.     Capillary Refill: Capillary refill takes less than 2 seconds.  Neurological:     General: No focal deficit present.     Mental Status: She is alert and oriented to person, place, and time. Mental status is at baseline.     Gait: Gait normal.  Psychiatric:        Mood and Affect: Mood normal.        Behavior: Behavior normal.        Thought Content: Thought content normal.        Judgment: Judgment normal.      Lab Results:  CBC    Component Value Date/Time   WBC 3.3 (L) 09/21/2018 1513   RBC 4.26 09/21/2018 1513   HGB 13.8 09/21/2018 1513   HCT 41.3 09/21/2018 1513   PLT 201.0 09/21/2018 1513   MCV 97.0 09/21/2018 1513   MCH 31.4 03/05/2010 0505   MCHC 33.5 09/21/2018 1513   RDW 14.3 09/21/2018 1513   LYMPHSABS 1.5 09/21/2018 1513   MONOABS 0.4 09/21/2018 1513   EOSABS 0.1 09/21/2018 1513   BASOSABS 0.0 09/21/2018 1513    BMET    Component Value Date/Time   NA 141 08/18/2014 0821   K 4.0 08/18/2014 0821   CL 107 08/18/2014 0821   CO2 27 08/18/2014 0821   GLUCOSE 100 (H) 08/18/2014 0821   BUN 15 08/18/2014 0821   CREATININE 0.9 08/18/2014 0821   CALCIUM 9.1 08/18/2014 0821   GFRNONAA >60 03/04/2010 0445   GFRAA  03/04/2010 0445    >60        The eGFR has been calculated using the MDRD equation. This calculation has not been validated in all clinical situations. eGFR's persistently <60 mL/min signify possible Chronic Kidney Disease.    BNP No results found for: BNP  ProBNP No results found for: PROBNP    Assessment & Plan:   Mild intermittent asthma without complication Plan: Trial of Symbicort 80 Continue rescue inhaler as needed Work on increasing daily physical activity Continue daily allergy pill Continue PPI GERD prophylaxis Follow-up with our office in 6 to 8 weeks Recommended  patient receive flu vaccine, declined today, [facility]  Restrictive lung disease Restrictive pattern on pulmonary function test today, BMI 47.5 Likely restriction due to obesity  Plan: Work on increasing   daily physical activity Trial of Symbicort 80 today  GERD (gastroesophageal reflux disease) Plan: Continue omeprazole  Healthcare maintenance Plan: Recommended flu vaccine today, patient declined We will request records from primary care office for pneumonia vaccines  At risk for obstructive sleep apnea Plan: Complete scheduled home sleep study managed by cardiology    Return in about 2 months (around 06/12/2019), or if symptoms worsen or fail to improve, for Follow up with Dr. Mannam, Follow up with Brian Mack FNP-C.   Brian P Mack, NP 04/12/2019   This appointment was 32 minutes long with over 50% of the time in direct face-to-face patient care, assessment, plan of care, and follow-up. 

## 2019-04-12 NOTE — Assessment & Plan Note (Signed)
Restrictive pattern on pulmonary function test today, BMI 47.5 Likely restriction due to obesity  Plan: Work on increasing daily physical activity Trial of Symbicort 80 today

## 2019-04-14 ENCOUNTER — Ambulatory Visit (INDEPENDENT_AMBULATORY_CARE_PROVIDER_SITE_OTHER): Payer: Medicare Other | Admitting: Pharmacist

## 2019-04-14 ENCOUNTER — Other Ambulatory Visit: Payer: Self-pay

## 2019-04-14 DIAGNOSIS — I4891 Unspecified atrial fibrillation: Secondary | ICD-10-CM

## 2019-04-14 DIAGNOSIS — Z5181 Encounter for therapeutic drug level monitoring: Secondary | ICD-10-CM | POA: Diagnosis not present

## 2019-04-14 DIAGNOSIS — Z7901 Long term (current) use of anticoagulants: Secondary | ICD-10-CM

## 2019-04-14 LAB — POCT INR: INR: 2 (ref 2.0–3.0)

## 2019-04-14 NOTE — Patient Instructions (Signed)
Description   Take 1 tablet today, then continue on same dosage 1 tablet daily except 1/2 tablet on Mondays, Wednesdays and Fridays.  Recheck in 5 weeks.  Call if started on new meds or antibiotics [phone].

## 2019-04-15 ENCOUNTER — Other Ambulatory Visit: Payer: Self-pay | Admitting: Cardiovascular Disease

## 2019-04-20 ENCOUNTER — Other Ambulatory Visit: Payer: Self-pay | Admitting: Pulmonary Disease

## 2019-04-20 ENCOUNTER — Telehealth: Payer: Self-pay | Admitting: Pulmonary Disease

## 2019-04-20 MED ORDER — BUDESONIDE-FORMOTEROL FUMARATE 80-4.5 MCG/ACT IN AERO: 2.0000 | INHALATION_SPRAY | Freq: Two times a day (BID) | RESPIRATORY_TRACT | 3 refills | Status: DC

## 2019-04-20 NOTE — Telephone Encounter (Signed)
Please advise 

## 2019-04-20 NOTE — Telephone Encounter (Signed)
  Spoke with pt and advised rx for symbicort sent to pharmacy. Nothing further is needed.     1. Mild intermittent asthma without complication  Trial of Symbicort 80 >>> 2 puffs in the morning right when you wake up, rinse out your mouth after use, 12 hours later 2 puffs, rinse after use >>> Take this daily, no matter what >>> This is not a rescue inhaler   Contact us in 1 week and let us know how you are doing on this inhaler, if you are tolerating it well and doing well on it then we can place a prescription  Only use your albuterol as a rescue medication to be used if you can't catch your breath by resting or doing a relaxed purse lip breathing pattern.  - The less you use it, the better it will work when you need it. - Ok to use up to 2 puffs every 4 hours if you must but call for immediate appointment if use goes up over your usual need - Don't leave home without it !! (think of it like the spare tire for your car)   Continue daily allergy pill  Continue to increase daily physical activity  We recommend that you receive your flu vaccine

## 2019-04-21 ENCOUNTER — Other Ambulatory Visit: Payer: Self-pay | Admitting: Pulmonary Disease

## 2019-04-23 ENCOUNTER — Telehealth: Payer: Self-pay | Admitting: Pulmonary Disease

## 2019-04-23 DIAGNOSIS — Z79899 Other long term (current) drug therapy: Secondary | ICD-10-CM

## 2019-04-23 NOTE — Telephone Encounter (Signed)
Call returned to patient, she reports her insurance does not cover the Symbicort. She reports after speaking with her insurance they suggest using Advair because this is what is covered. I mentioned doing a PA and she states from what they are saying it will still be too much money. She states it costs $1100 without insurance and they told her with a PA it would still be expensive. Made aware I would check with Aaron Edelman to see if okay to switch. She reports she is disappointed because the symbicort is helping but she cannot afford it when she still has a deductible of $500.   B Mack please advise if okay to switch to advair.

## 2019-04-23 NOTE — Telephone Encounter (Signed)
Called and spoke with Patient.  Brian,NP recommendations given.  Understanding stated.  Pharmacy referral placed. Nothing further at this time.

## 2019-04-23 NOTE — Telephone Encounter (Signed)
Okay to switch back to Advair for the time beingI would recommend that the patient check with her insurance on her formulary's for ICS/laba inhalers.  Patient also should be scheduled for a appointment with our clinical pharmacy team who can help work with her on inhaler coverage and to see if there is any other inhalers that may be a better option for her versus the Advair.  Please place order for ambulatory referral to clinical pharmacist.  This needs to be scheduled as a Friday appointment as this is 1 and per the clinical pharmacist is in our office.Wyn Quaker, FNP

## 2019-04-28 ENCOUNTER — Telehealth: Payer: Self-pay | Admitting: Pulmonary Disease

## 2019-04-28 NOTE — Telephone Encounter (Signed)
Called and spoke w/ pt. Pt states she got in touch with her insurance company and states that the [facility] would have an initial deductible of $500, then $30 until the end of the year, which she states would be manageable; however, she does not want to have to pay $500 in January again.  I reinforced to pt that we referred her to the pharmacy team on 04/23/2019. I checked her upcoming appts and no appt has been made with the pharmacy team. I let her know I would reach out to the pharmacy team to hopefully get her scheduled by this Friday 04/30/2019, if not as soon as possible. Pt expressed understanding.   Pt is also inquiring if she could receive a sample or two of Symbicort 80 to hold her over until she is able to meet with the pharmacy team. I let her know I would touch bases with a provider and call her back with their response.   Aaron Edelman, please advise if you would be ok with providing pt with sample Symbicort 80. I have spoken with pharmacy tech Rachael who states she is working on getting pt scheduled for pharmacy team on Friday 04/30/2019.

## 2019-04-28 NOTE — Telephone Encounter (Signed)
Ran test claim to check Symbicort 80mg  benefits on patient's plan.  Symbicort is non-formulary on the patient's plan, they prefer Advair. Ran test claim for Advair, patient's copay is $301.18 for 30 day supply which part of her $500 deductible is being applied.  Will discuss options with patient at visit.  3:05 PM Tammy Robbins, CPhT

## 2019-04-28 NOTE — Telephone Encounter (Signed)
Patient has been scheduled for Friday 04/30/19 @ 9:15am with the pharmacy team to look into the cost of Symbicort 80.  Advised patient to bring income documents and prescription out of pocket that may be needed for patient assistance.  2:29 PM Beatriz Chancellor, CPhT

## 2019-04-29 NOTE — Telephone Encounter (Signed)
Thank you.   Brian Mack FNP

## 2019-04-29 NOTE — Progress Notes (Signed)
Pharmacy Note  Subjective: Patient presents today to the [facility] to see pharmacy team.  Patient was referred by Wyn Quaker, FNP at [facility] on 04/23/19 for financial assistance.  She was given a sample of Symbicort 04/12/2019 but it is not covered through insurance.  She has not tried any other inhalers besides Symbicort and albuterol rescue inhaler.  She has noticed an improvement in her symptoms since starting Symbicort.  She is out of medication and last dose of Symbicort was Monday 9/14.   Exacerbation history (past year): none   Hospitalization history (past year): none  Pneumonia history: 1 (09/2018)  Current pulmonology meds:  -Budesonide/formoterol (Symbicort) 80-4.5 mcg 2 inhalations BID  Prior pulmonology meds: -Fluticasone/salmeterol (Advair HFA) 45-21 mcg 2 inhalations BID (not filled due to cost) -Budesonide/formoterol (Symbicort)160-2.5 mg 2 inhalations BID (sample) -Albuterol (Proair) prn   Objective: Pulmonology  04/12/2019-pulmonary function test-FVC 1.84 (63% predicted), postbronchodilator ratio 80, postbronchodilator FEV1 1.58 (72% predicted), positive bronchodilator response in the FEV1, positive mid flow reversibility, DLCO 17.79 (92% predicted)  09/21/2018-IgE-38 09/21/2018-CBC with differential- eosinophils relative 4.3, eosinophils absolute 0.1  CT abdomen pelvis 02/08/15-visualized lung bases are clear. Chest x-ray 07/14/2018-clear lungs with no acute cardiopulmonary abnormality. Chest x-ray2/05/2019-right perihilar infiltrate 10/20/2018-chest x-ray- resolution of pulmonary infiltrates, tiny right pleural effusion versus pleural thickening unchanged  04/08/2019-SARS-CoV-2-negative  Renal Function CrCl (adjBW): 74.1 mL/min EGFR (2016): 68  Current Outpatient Medications on File Prior to Visit  Medication Sig Dispense Refill  . allopurinol (ZYLOPRIM) 300 MG tablet Take 300 mg by mouth daily.    . budesonide-formoterol (SYMBICORT)  80-4.5 MCG/ACT inhaler Inhale 2 puffs into the lungs 2 (two) times daily. 1 Inhaler 3  . carvedilol (COREG) 25 MG tablet Take 1 tablet (25 mg total) by mouth 2 (two) times daily. 180 tablet 3  . cephALEXin (KEFLEX) 250 MG capsule Take 250 mg by mouth daily.    . Clidinium-Chlordiazepoxide (LIBRAX PO) Take by mouth 2 (two) times daily as needed.     . fish oil-omega-3 fatty acids 1000 MG capsule Take by mouth daily.      . hydrochlorothiazide (HYDRODIURIL) 25 MG tablet Take 1 tablet (25 mg total) by mouth daily. 30 tablet 3  . levothyroxine (SYNTHROID, LEVOTHROID) 75 MCG tablet Take 75 mcg by mouth daily before breakfast.    . meclizine (ANTIVERT) 12.5 MG tablet Take 12.5 mg by mouth 3 (three) times daily as needed for dizziness.    . Multiple Vitamin (MULTIVITAMIN) tablet Take 1 tablet by mouth daily.      . nitroGLYCERIN (NITROSTAT) 0.4 MG SL tablet Place 1 tablet (0.4 mg total) under the tongue every 5 (five) minutes as needed. 25 tablet 6  . omeprazole (PRILOSEC) 20 MG capsule Take 20 mg by mouth daily.      . potassium chloride (K-DUR) 10 MEQ tablet TAKE 1 TABLET BY MOUTH 2 (TWO) TIMES DAILY. PT MUST KEEP UPCOMING APPT IN AUG TO GET FURTHER REFILLS 180 tablet 3  . simvastatin (ZOCOR) 20 MG tablet Take 20 mg by mouth at bedtime.      Marland Kitchen warfarin (COUMADIN) 5 MG tablet TAKE AS DIRECTED BY COUMADIN CLINIC 90 tablet 1   No current facility-administered medications on file prior to visit.      Assessment/Plan:  1. Financial Medication Assistance  Symbicort inhaler is non-formulary through her insurance and out-pocket cost is too expensive.  Advair HFA is preferred through insurance.  Her co-pay  is $301.18 for 30 day supply which part of her $500  deductible is being applied.  She brought her income and out of pocket expenses in order to determine if she was eligible patient assistance.  Her income is around 30,000 per year for a household of 1.  Her out of pocket expenses for this year total was  $94.    She is not eligible for [employer] (Advair/Breo) patient assistance as she has not spent greater than $600 out of pocket on prescriptions.  She is not eligible for AZ&Me (Symbicort) as she has not spent 3% of her income on prescriptions.  She is not eligible for [employer] Ruthe Mannan) patient assistance as she has insurance.  Asthma grant is not open at this time.  Patient is able to afford co-pay of $301.18 for Advair at this time.  This will help meet her deductible and then her co-pay for Advair will be $30.  This will potentially help her meet the $600 out of pocket cost to qualify for [place] patient assistance in the future.  Instructed patient to notify the pharmacy team when she has spent $600 out of pocket and we can assist her in applying for patient assistance.  Will also reach out to patient if the asthma grant opens.    Also instructed patient to reach out to [facility] to discuss switching Medicare plans during open enrollment for better inhaler coverage.  Patient was given information to the [employer].  Prescription for Advair HFA 115-21 mcg inhaler 2 puffs twice daily sent to local pharmacy.  2. Inhaler Technique  Patient was counseled on the purpose, proper use, and adverse effects of Advair HFA inhaler.  Instructed patient to rinse mouth with water after using in order to prevent fungal infection.  Patient verbalized understanding.  Reviewed appropriate use of maintenance vs rescue inhalers.  Stressed importance of using maintenance inhaler daily and rescue inhaler only as needed.  Patient verbalized understanding.  Demonstrated proper inhaler technique using demo inhaler.  Patient able to demonstrate proper inhaler technique using teach back method.   All questions encouraged and answered.  Instructed patient to call with any questions or concerns.  Thank you for involving pharmacy to assist in providing Ms.Mcmillion's care.  Mariella Saa, PharmD, [place], [place] [facility]  Pharmacist [phone]  04/30/2019 10:41 AM

## 2019-04-30 ENCOUNTER — Other Ambulatory Visit: Payer: Self-pay

## 2019-04-30 ENCOUNTER — Ambulatory Visit (INDEPENDENT_AMBULATORY_CARE_PROVIDER_SITE_OTHER): Payer: Medicare Other | Admitting: Pharmacist

## 2019-04-30 DIAGNOSIS — Z79899 Other long term (current) drug therapy: Secondary | ICD-10-CM

## 2019-04-30 DIAGNOSIS — J4520 Mild intermittent asthma, uncomplicated: Secondary | ICD-10-CM

## 2019-04-30 MED ORDER — ADVAIR HFA 115-21 MCG/ACT IN AERO: 2.0000 | INHALATION_SPRAY | Freq: Two times a day (BID) | RESPIRATORY_TRACT | 12 refills | Status: DC

## 2019-04-30 NOTE — Patient Instructions (Addendum)
   Start Advair HFA 2 puffs twice a day  Remember to rinse your mouth out after each use   Contact [facility] for counseling on Medicare plans during open enrollment  Thank you for meeting with the pharmacy team! Please reach out with any other questions or concerns.

## 2019-05-19 ENCOUNTER — Ambulatory Visit (INDEPENDENT_AMBULATORY_CARE_PROVIDER_SITE_OTHER): Payer: Medicare Other | Admitting: *Deleted

## 2019-05-19 ENCOUNTER — Other Ambulatory Visit: Payer: Self-pay

## 2019-05-19 DIAGNOSIS — Z7901 Long term (current) use of anticoagulants: Secondary | ICD-10-CM | POA: Diagnosis not present

## 2019-05-19 DIAGNOSIS — Z5181 Encounter for therapeutic drug level monitoring: Secondary | ICD-10-CM

## 2019-05-19 DIAGNOSIS — I4891 Unspecified atrial fibrillation: Secondary | ICD-10-CM | POA: Diagnosis not present

## 2019-05-19 LAB — POCT INR: INR: 2.2 (ref 2.0–3.0)

## 2019-05-19 NOTE — Patient Instructions (Signed)
Description   Continue on same dosage 1 tablet daily except 1/2 tablet on Mondays, Wednesdays and Fridays.  Recheck in 6 weeks.  Call if started on new meds or antibiotics #[phone].      

## 2019-06-02 ENCOUNTER — Encounter (HOSPITAL_BASED_OUTPATIENT_CLINIC_OR_DEPARTMENT_OTHER): Payer: Medicare Other | Admitting: Cardiology

## 2019-06-02 ENCOUNTER — Ambulatory Visit (HOSPITAL_BASED_OUTPATIENT_CLINIC_OR_DEPARTMENT_OTHER): Payer: Medicare Other | Attending: Physician Assistant | Admitting: Cardiology

## 2019-06-02 ENCOUNTER — Other Ambulatory Visit: Payer: Self-pay

## 2019-06-02 DIAGNOSIS — G4733 Obstructive sleep apnea (adult) (pediatric): Secondary | ICD-10-CM

## 2019-06-02 DIAGNOSIS — R0902 Hypoxemia: Secondary | ICD-10-CM | POA: Insufficient documentation

## 2019-06-02 DIAGNOSIS — I10 Essential (primary) hypertension: Secondary | ICD-10-CM

## 2019-06-02 DIAGNOSIS — R0683 Snoring: Secondary | ICD-10-CM | POA: Insufficient documentation

## 2019-06-03 ENCOUNTER — Encounter: Payer: Self-pay | Admitting: Pulmonary Disease

## 2019-06-03 ENCOUNTER — Ambulatory Visit (INDEPENDENT_AMBULATORY_CARE_PROVIDER_SITE_OTHER): Payer: Medicare Other | Admitting: Pulmonary Disease

## 2019-06-03 ENCOUNTER — Telehealth: Payer: Self-pay | Admitting: *Deleted

## 2019-06-03 VITALS — BP 118/74 | HR 73 | Temp 97.2°F | Ht 64.0 in | Wt 279.6 lb

## 2019-06-03 DIAGNOSIS — J4530 Mild persistent asthma, uncomplicated: Secondary | ICD-10-CM

## 2019-06-03 NOTE — Telephone Encounter (Signed)
Informed patient of sleep study results and patient understanding was verbalized. Patient understands her sleep study showed they have sleep apnea and recommend CPAP titration. Please set up titration in the sleep lab.    Titration sent to sleep pool  

## 2019-06-03 NOTE — Patient Instructions (Signed)
I am glad you are doing well with regard to your breathing. Continue the Advair Review the sleep study with your cardiologist once results are available  Follow-up in 6 months.

## 2019-06-03 NOTE — Telephone Encounter (Signed)
-----   Message from Sueanne Margarita, MD sent at 06/03/2019  5:09 PM EDT ----- Please let patient know that they have sleep apnea and recommend CPAP titration. Please set up titration in the sleep lab.

## 2019-06-03 NOTE — Procedures (Signed)
    Patient Name: Tammy Robbins, Tammy Robbins Date: 06/02/2019 Gender: Female D.O.B: 09/23/1945 Age (years): 73 Referring Provider: Richardson Dopp Height (inches): 4 Interpreting Physician: Fransico Him MD, ABSM Weight (lbs): 275 RPSGT: Jacolyn Reedy BMI: 47 MRN: [id number] Neck Size: 14.50  CLINICAL INFORMATION Sleep Study Type: HST  Indication for sleep study: N/A  Epworth Sleepiness Score: 3  SLEEP STUDY TECHNIQUE A multi-channel overnight portable sleep study was performed. The channels recorded were: nasal airflow, thoracic respiratory movement, and oxygen saturation with a pulse oximetry. Snoring was also monitored.  MEDICATIONS Patient self administered medications include: N/A.  SLEEP ARCHITECTURE Patient was studied for 398.8 minutes. The sleep efficiency was 100.0 % and the patient was supine for 0%. The arousal index was 0.0 per hour.  RESPIRATORY PARAMETERS The overall AHI was 60.0 per hour, with a central apnea index of 0.0 per hour.  The oxygen nadir was 70% during sleep.  CARDIAC DATA Mean heart rate during sleep was 76.6 bpm.  IMPRESSIONS - Severe obstructive sleep apnea occurred during this study (AHI = 60.0/h). - No significant central sleep apnea occurred during this study (CAI = 0.0/h). - Severe oxygen desaturation was noted during this study (Min O2 = 70%). - Patient snored 2.6% during the sleep.  DIAGNOSIS - Obstructive Sleep Apnea (327.23 [G47.33 ICD-10]) - Nocturnal Hypoxemia (327.26 [G47.36 ICD-10])  RECOMMENDATIONS - Recommend in lab CPAP titration due to severity of sleep disordered breathing and nocturnal hypoxemia.  - Avoid alcohol, sedatives and other CNS depressants that may worsen sleep apnea and disrupt normal sleep architecture. - Sleep hygiene should be reviewed to assess factors that may improve sleep quality. - Weight management and regular exercise should be initiated or continued.  [Electronically signed] 06/03/2019 05:07 PM   Fransico Him MD, ABSM Diplomate, American Board of Sleep Medicine

## 2019-06-03 NOTE — Progress Notes (Signed)
Tammy Robbins    [id number]    11-Jan-1946  Primary Care Physician:Roberts, Windy Fast, MD  Referring Physician: Burton Apley, MD [address] [place],  [place] [address]  Chief complaint: Follow up for dyspnea, wheezing  HPI: 73 year old never smoker with history of tension, A. fib, aortic stenosis, GERD, hypothyroidism, diastolic dysfunction.  Presenting with cough, wheezing, dyspnea for the past 1 month.  Symptoms started after upper respiratory tract infection worsened during Thanksgiving break.  Seen at primary care and given prednisone taper.  She is also on Keflex for UTI.  She saw primary care yesterday with a chest x-ray which showed no acute lung infiltrate.  Keflex dose was increased.  She has no previous history of lung issues and follows with Dr. Melburn Robbins, cardiology.  She had cardiac catheterization in the past with no evidence of coronary artery disease.  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  Interim history: Feeling better after antibiotics. States that breathing is back to baseline.  Has mild DOE, no cough, sputum, wheezing.  Outpatient Encounter Medications as of 06/03/2019  Medication Sig  . allopurinol (ZYLOPRIM) 300 MG tablet Take 300 mg by mouth daily.  . carvedilol (COREG) 25 MG tablet Take 1 tablet (25 mg total) by mouth 2 (two) times daily.  . cephALEXin (KEFLEX) 250 MG capsule Take 250 mg by mouth daily.  . Clidinium-Chlordiazepoxide (LIBRAX PO) Take by mouth 2 (two) times daily as needed.   . fish oil-omega-3 fatty acids 1000 MG capsule Take by mouth daily.    . fluticasone-salmeterol (ADVAIR HFA) 115-21 MCG/ACT inhaler Inhale 2 puffs into the lungs 2 (two) times daily.  . hydrochlorothiazide (HYDRODIURIL)  25 MG tablet Take 1 tablet (25 mg total) by mouth daily.  Marland Kitchen levothyroxine (SYNTHROID, LEVOTHROID) 75 MCG tablet Take 75 mcg by mouth daily before breakfast.  . meclizine (ANTIVERT) 12.5 MG tablet Take 12.5 mg by mouth 3 (three) times daily as needed for dizziness.  . Multiple Vitamin (MULTIVITAMIN) tablet Take 1 tablet by mouth daily.    . nitroGLYCERIN (NITROSTAT) 0.4 MG SL tablet Place 1 tablet (0.4 mg total) under the tongue every 5 (five) minutes as needed.  Marland Kitchen omeprazole (PRILOSEC) 20 MG capsule Take 20 mg by mouth daily.    . potassium chloride (K-DUR) 10 MEQ tablet TAKE 1 TABLET BY MOUTH 2 (TWO) TIMES DAILY. PT MUST KEEP UPCOMING APPT IN AUG TO GET FURTHER REFILLS  . simvastatin (ZOCOR) 20 MG tablet Take 20 mg by mouth at bedtime.    Marland Kitchen warfarin (COUMADIN) 5 MG tablet TAKE AS DIRECTED BY COUMADIN CLINIC   No facility-administered encounter medications on file as of 06/03/2019.    Physical Exam: Blood pressure 124/66, pulse 82, height 5\' 4"  (1.626 m), weight 267 lb 6.4 oz (121.3 kg), SpO2 95 %. Gen:      No acute distress HEENT:  EOMI, sclera anicteric Neck:     No masses; no thyromegaly Lungs:    Bilateral expiratory wheeze CV:         Regular rate and rhythm; no murmurs Abd:      + bowel sounds; soft, non-tender; no palpable masses, no distension Ext:    No edema; adequate peripheral perfusion Skin:      Warm and dry; no  rash Neuro: alert and oriented x 3 Psych: normal mood and affect  Data Reviewed: Imaging: CT abdomen pelvis 02/08/15- visualized lung bases are clear. Chest x-ray 07/14/2018- clear lungs with no acute cardiopulmonary abnormality. Chest x-ray 09/21/2018- right perihilar infiltrate. Chest x-ray 10/20/2018- resolution of lung infiltrate.Tiny right pleural effusion versus pleural thickening, unchanged.  I reviewed the images personally.  PFTs: 04/12/2019-FVC 1.99 [68%], FEV1 1.58 [72%], F/F 80, TLC 4.10 [81%], DLCO 17.79 [72%] No overt obstruction but small airways  disease present as suggested by curvature to flow loop, air trapping and improvement in mid flow following bronchodilator.  FENO 07/15/2018-7 FENO 09/21/2018-26  ACT score 06/03/2019-21  Labs CBC 09/21/2018-WBC 3.3, eos 4.3%, absolute eosinophil count 142 IgE 09/21/2018-38  Assessment:  Mild asthma Stable on Advair inhaler Continue over-the-counter antihistamine, steroid nasal spray  GERD, postnasal drip Continue prilosec  OSA Just completed a sleep study ordered by cardiology.  She will follow up with cardiology regarding the results and treatment.  Health maintenance 05/27/2019-influenza  Plan/Recommendations: -Continue Advair  Follow-up in 6 months  Tammy Garfinkel MD [facility] 06/03/2019, 8:32 AM  CC: Tammy Dy, MD

## 2019-06-04 ENCOUNTER — Telehealth: Payer: Self-pay | Admitting: *Deleted

## 2019-06-04 ENCOUNTER — Ambulatory Visit: Payer: Medicare Other | Admitting: Pulmonary Disease

## 2019-06-04 NOTE — Telephone Encounter (Signed)
Staff message sent to Nina ok to schedule sleep study. No PA is required. Patient has medicare. 

## 2019-06-08 ENCOUNTER — Telehealth: Payer: Self-pay | Admitting: *Deleted

## 2019-06-08 DIAGNOSIS — I10 Essential (primary) hypertension: Secondary | ICD-10-CM

## 2019-06-08 NOTE — Telephone Encounter (Signed)
-----   Message from Lauralee Evener, [place] sent at 06/04/2019  8:04 AM EDT ----- Regarding: RE: precert Ok to schedule titration study. Patient has Medicare. No PA is required. ----- Message ----- From: Freada Bergeron, CMA Sent: 06/03/2019   9:59 PM EDT To: [facility] Sleep Studies Subject: precert                                        recommend CPAP titration.

## 2019-06-09 NOTE — Telephone Encounter (Signed)
Patient is scheduled for CPAP Titration on 06/22/19. Azelia is scheduled for COVID screening on 06/19/19 11:35 prior to titration. Patient understands her titration study will be done at [facility] sleep lab. Patient understands she will receive a letter in a week or so detailing appointment, date, time, and location. Patient understands to call if he does not receive the letter  in a timely manner. Patient agrees with treatment and thanked me for call.

## 2019-06-19 ENCOUNTER — Other Ambulatory Visit: Payer: Self-pay | Admitting: Cardiovascular Disease

## 2019-06-19 ENCOUNTER — Other Ambulatory Visit (HOSPITAL_COMMUNITY)
Admission: RE | Admit: 2019-06-19 | Discharge: 2019-06-19 | Disposition: A | Discharge status: Home / Self Care | Payer: Medicare Other | Source: Ambulatory Visit | Attending: Cardiology | Admitting: Cardiology

## 2019-06-19 DIAGNOSIS — Z20828 Contact with and (suspected) exposure to other viral communicable diseases: Secondary | ICD-10-CM | POA: Insufficient documentation

## 2019-06-19 DIAGNOSIS — Z01812 Encounter for preprocedural laboratory examination: Secondary | ICD-10-CM | POA: Insufficient documentation

## 2019-06-20 LAB — SARS CORONAVIRUS 2 (TAT 6-24 HRS): SARS Coronavirus 2: NEGATIVE

## 2019-06-22 ENCOUNTER — Ambulatory Visit (HOSPITAL_BASED_OUTPATIENT_CLINIC_OR_DEPARTMENT_OTHER): Payer: Medicare Other | Attending: Cardiology | Admitting: Cardiology

## 2019-06-22 ENCOUNTER — Other Ambulatory Visit: Payer: Self-pay

## 2019-06-22 VITALS — Temp 96.8°F | Ht 64.0 in | Wt 275.0 lb

## 2019-06-22 DIAGNOSIS — I10 Essential (primary) hypertension: Secondary | ICD-10-CM | POA: Insufficient documentation

## 2019-06-22 DIAGNOSIS — I4891 Unspecified atrial fibrillation: Secondary | ICD-10-CM | POA: Diagnosis not present

## 2019-06-22 DIAGNOSIS — I493 Ventricular premature depolarization: Secondary | ICD-10-CM | POA: Insufficient documentation

## 2019-06-22 DIAGNOSIS — G4733 Obstructive sleep apnea (adult) (pediatric): Secondary | ICD-10-CM

## 2019-06-29 ENCOUNTER — Ambulatory Visit (INDEPENDENT_AMBULATORY_CARE_PROVIDER_SITE_OTHER): Payer: Medicare Other | Admitting: *Deleted

## 2019-06-29 ENCOUNTER — Other Ambulatory Visit: Payer: Self-pay

## 2019-06-29 DIAGNOSIS — I4891 Unspecified atrial fibrillation: Secondary | ICD-10-CM

## 2019-06-29 DIAGNOSIS — Z7901 Long term (current) use of anticoagulants: Secondary | ICD-10-CM | POA: Diagnosis not present

## 2019-06-29 DIAGNOSIS — Z5181 Encounter for therapeutic drug level monitoring: Secondary | ICD-10-CM

## 2019-06-29 LAB — POCT INR: INR: 2.5 (ref 2.0–3.0)

## 2019-06-29 NOTE — Patient Instructions (Signed)
Description   Continue on same dosage 1 tablet daily except 1/2 tablet on Mondays, Wednesdays and Fridays.  Recheck in 7 weeks.  Call if started on new meds or antibiotics [phone].

## 2019-07-05 NOTE — Procedures (Signed)
   Patient Name: Tammy Robbins, Tammy Robbins Date: 06/22/2019 Gender: Female D.O.B: 31-May-1946 Age (years): 27 Referring Provider: Fransico Him MD, ABSM Height (inches): 64 Interpreting Physician: Fransico Him MD, ABSM Weight (lbs): 275 RPSGT: Laren Everts BMI: 47 MRN: [id number] Neck Size: 14.50  CLINICAL INFORMATION The patient is referred for a CPAP titration to treat sleep apnea.  SLEEP STUDY TECHNIQUE As per the AASM Manual for the Scoring of Sleep and Associated Events v2.3 (April 2016) with a hypopnea requiring 4% desaturations.  The channels recorded and monitored were frontal, central and occipital EEG, electrooculogram (EOG), submentalis EMG (chin), nasal and oral airflow, thoracic and abdominal wall motion, anterior tibialis EMG, snore microphone, electrocardiogram, and pulse oximetry. Continuous positive airway pressure (CPAP) was initiated at the beginning of the study and titrated to treat sleep-disordered breathing.  MEDICATIONS Medications self-administered by patient taken the night of the study : ADVAIR DISKUS, CEPHALEXIN, OMEPRAZOLE, SIMVASTATIN, WARFARIN  TECHNICIAN COMMENTS Comments added by technician: NONE Comments added by scorer: N/A  RESPIRATORY PARAMETERS Optimal PAP Pressure (cm): 12  AHI at Optimal Pressure (/hr):0.0 Overall Minimal O2 (%):85.0  Supine % at Optimal Pressure (%):0 Minimal O2 at Optimal Pressure (%): 88.0   SLEEP ARCHITECTURE The study was initiated at 9:56:50 PM and ended at 4:50:40 AM.  Sleep onset time was 22.4 minutes and the sleep efficiency was 64.6%. The total sleep time was 267.5 minutes.  The patient spent 10.8% of the night in stage N1 sleep, 76.3% in stage N2 sleep, 0.0% in stage N3 and 12.9% in REM.Stage REM latency was 125.5 minutes  Wake after sleep onset was 123.9. Alpha intrusion was absent. Supine sleep was 3.92%.  CARDIAC DATA The 2 lead EKG demonstrated atrial fibrillation. The mean heart rate was 76.8 beats per  minute. Other EKG findings include: PVCs.  LEG MOVEMENT DATA The total Periodic Limb Movements of Sleep (PLMS) were 0. The PLMS index was 0.0. A PLMS index of <15 is considered normal in adults.  IMPRESSIONS - The optimal PAP pressure was 12 cm of water. - Central sleep apnea was not noted during this titration (CAI = 0.0/h). - Moderate oxygen desaturations were observed during this titration (min O2 = 85.0%). - The patient snored with moderate snoring volume during this titration study. - 2-lead EKG demonstrated: PVCs and atrial fibrillation. - Clinically significant periodic limb movements were not noted during this study. Arousals associated with PLMs were rare.  DIAGNOSIS - Obstructive Sleep Apnea (327.23 [G47.33 ICD-10])  RECOMMENDATIONS - Trial of CPAP therapy on 12 cm H2O with a Medium size Resmed Full Face Mask AirFit F20 mask and heated humidification. - Avoid alcohol, sedatives and other CNS depressants that may worsen sleep apnea and disrupt normal sleep architecture. - Sleep hygiene should be reviewed to assess factors that may improve sleep quality. - Weight management and regular exercise should be initiated or continued. - Return to Sleep Center for re-evaluation after 10 weeks of therapy  [Electronically signed] 07/05/2019 02:15 PM  Fransico Him MD, ABSM Diplomate, American Board of Sleep Medicine

## 2019-07-07 ENCOUNTER — Telehealth: Payer: Self-pay | Admitting: *Deleted

## 2019-07-07 NOTE — Telephone Encounter (Signed)
-----   Message from Sueanne Margarita, MD sent at 07/05/2019  2:26 PM EST ----- Please let patient know that they had a successful PAP titration and let DME know that orders are in EPIC.  Please set up 10 week OV with me.

## 2019-07-07 NOTE — Telephone Encounter (Signed)
Informed patient of sleep study results and patient understanding was verbalized. Patient understands her sleep study showed they had a successful PAP titration and let DME know that orders are in EPIC.  Upon patient request DME selection is [facility]. Patient understands she will be contacted by [facility] to set up her cpap. Patient understands to call if [facility] does not contact her with new setup in a timely manner. Patient understands they will be called once confirmation has been received from [facility] that they have received their new machine to schedule 10 week follow up appointment.  [facility] notified of new cpap order  Please add to airview Patient was grateful for the call and thanked me.

## 2019-07-12 ENCOUNTER — Other Ambulatory Visit: Payer: Self-pay | Admitting: Internal Medicine

## 2019-07-12 DIAGNOSIS — Z1231 Encounter for screening mammogram for malignant neoplasm of breast: Secondary | ICD-10-CM

## 2019-07-28 NOTE — Telephone Encounter (Signed)
Patient has a 10 week follow up appointment scheduled for 09/17/19. Patient understands she needs to keep this appointment for insurance compliance. Patient was grateful for the call and thanked me.

## 2019-07-30 ENCOUNTER — Other Ambulatory Visit: Payer: Self-pay | Admitting: Cardiovascular Disease

## 2019-08-18 ENCOUNTER — Ambulatory Visit (INDEPENDENT_AMBULATORY_CARE_PROVIDER_SITE_OTHER): Payer: Medicare Other | Admitting: *Deleted

## 2019-08-18 ENCOUNTER — Other Ambulatory Visit: Payer: Self-pay

## 2019-08-18 DIAGNOSIS — Z5181 Encounter for therapeutic drug level monitoring: Secondary | ICD-10-CM | POA: Diagnosis not present

## 2019-08-18 DIAGNOSIS — I4891 Unspecified atrial fibrillation: Secondary | ICD-10-CM | POA: Diagnosis not present

## 2019-08-18 DIAGNOSIS — Z7901 Long term (current) use of anticoagulants: Secondary | ICD-10-CM | POA: Diagnosis not present

## 2019-08-18 LAB — POCT INR: INR: 2 (ref 2.0–3.0)

## 2019-08-18 NOTE — Patient Instructions (Signed)
Description   Continue on same dosage 1 tablet daily except 1/2 tablet on Mondays, Wednesdays and Fridays.  Recheck in 8 weeks.  Call if started on new meds or antibiotics [phone].

## 2019-09-01 ENCOUNTER — Ambulatory Visit
Admission: RE | Admit: 2019-09-01 | Discharge: 2019-09-01 | Disposition: A | Discharge status: Home / Self Care | Payer: Medicare Other | Source: Ambulatory Visit | Attending: Internal Medicine | Admitting: Internal Medicine

## 2019-09-01 ENCOUNTER — Other Ambulatory Visit: Payer: Self-pay

## 2019-09-01 DIAGNOSIS — Z1231 Encounter for screening mammogram for malignant neoplasm of breast: Secondary | ICD-10-CM

## 2019-09-10 ENCOUNTER — Telehealth: Payer: Self-pay | Admitting: *Deleted

## 2019-09-10 NOTE — Telephone Encounter (Signed)

## 2019-09-16 NOTE — Progress Notes (Signed)
Virtual Visit via Video Note   This visit type was conducted due to national recommendations for restrictions regarding the COVID-19 Pandemic (e.g. social distancing) in an effort to limit this patient's exposure and mitigate transmission in our community.  Due to her co-morbid illnesses, this patient is at least at moderate risk for complications without adequate follow up.  This format is felt to be most appropriate for this patient at this time.  All issues noted in this document were discussed and addressed.  A limited physical exam was performed with this format.  Please refer to the patient's chart for her consent to telehealth for [facility].   Evaluation Performed:  Follow-up visit  This visit type was conducted due to national recommendations for restrictions regarding the COVID-19 Pandemic (e.g. social distancing).  This format is felt to be most appropriate for this patient at this time.  All issues noted in this document were discussed and addressed.  No physical exam was performed (except for noted visual exam findings with Video Visits).  Please refer to the patient's chart (MyChart message for video visits and phone note for telephone visits) for the patient's consent to telehealth for [facility].  Date:  09/17/2019   ID:  Tammy Robbins, DOB 20-Oct-1945, MRN [id number]  Patient Location:  Home  Provider location:   [place]  PCP:  Burton Apley, MD  Cardiologist:  Kristeen Miss, MD  Sleep Medicine:  Armanda Magic, MD Electrophysiologist:  None   Chief Complaint:  OSA  History of Present Illness:    Tammy Robbins is a 74 y.o. female who presents via audio/video conferencing for a telehealth visit today.    This is a 74yo female with a hx of AS, HTN and PAF who was referred for sleep study.  She underwent PSG on 08/02/2019 showing severe OSA with an AHI of 60/hr with severe nocturnal hypoxemia with O2 sats as low as 70%.  She underwent CPAP titration to 12cm H2O.   She is here today for followup and is doing well.  She denies any chest pain or pressure, SOB, DOE, PND, orthopnea, LE edema, dizziness, palpitations or syncope. She is compliant with her meds and is tolerating meds with no SE.    The patient does not have symptoms concerning for COVID-19 infection (fever, chills, cough, or new shortness of breath).    Prior CV studies:   The following studies were reviewed today:  Sleep study, CPAP titration, PAP compliance download in [place]  Past Medical History:  Diagnosis Date  . Anxiety   . Atrial fibrillation (HCC)   . Chest tightness   . Diastolic dysfunction   . HTN (hypertension)   . Hyperlipemia   . Mild aortic stenosis   . Mild tricuspid regurgitation    Past Surgical History:  Procedure Laterality Date  . ABDOMINAL HYSTERECTOMY    . BUNIONECTOMY    . CARDIAC CATHETERIZATION     NORMAL  . CATARACT EXTRACTION    . CHOLECYSTECTOMY    . EYE SURGERY    . JOINT REPLACEMENT    . PARTIAL HYSTERECTOMY    . TOTAL KNEE ARTHROPLASTY     LEFT     Current Meds  Medication Sig  . allopurinol (ZYLOPRIM) 300 MG tablet Take 300 mg by mouth daily.  . carvedilol (COREG) 25 MG tablet Take 1 tablet (25 mg total) by mouth 2 (two) times daily.  . cephALEXin (KEFLEX) 250 MG capsule Take 250 mg by mouth daily.  Marland Kitchen  Clidinium-Chlordiazepoxide (LIBRAX PO) Take by mouth 2 (two) times daily as needed.   . fish oil-omega-3 fatty acids 1000 MG capsule Take by mouth daily.    . fluticasone-salmeterol (ADVAIR HFA) 115-21 MCG/ACT inhaler Inhale 2 puffs into the lungs 2 (two) times daily.  . hydrochlorothiazide (HYDRODIURIL) 25 MG tablet TAKE 1 TABLET BY MOUTH EVERY DAY  . levothyroxine (SYNTHROID, LEVOTHROID) 75 MCG tablet Take 75 mcg by mouth daily before breakfast.  . meclizine (ANTIVERT) 12.5 MG tablet Take 12.5 mg by mouth 3 (three) times daily as needed for dizziness.  . Multiple Vitamin (MULTIVITAMIN) tablet Take 1 tablet by mouth daily.    .  nitroGLYCERIN (NITROSTAT) 0.4 MG SL tablet Place 1 tablet (0.4 mg total) under the tongue every 5 (five) minutes as needed.  Marland Kitchen omeprazole (PRILOSEC) 20 MG capsule Take 20 mg by mouth daily.    . potassium chloride (K-DUR) 10 MEQ tablet TAKE 1 TABLET BY MOUTH 2 (TWO) TIMES DAILY. PT MUST KEEP UPCOMING APPT IN AUG TO GET FURTHER REFILLS  . simvastatin (ZOCOR) 20 MG tablet Take 20 mg by mouth at bedtime.    Marland Kitchen warfarin (COUMADIN) 5 MG tablet TAKE AS DIRECTED BY COUMADIN CLINIC     Allergies:   Diflucan [fluconazole]   Social History   Tobacco Use  . Smoking status: Never Smoker  . Smokeless tobacco: Never Used  Substance Use Topics  . Alcohol use: Yes    Comment: occassionally  . Drug use: No     Family Hx: The patient's family history includes Hypertension in her father; Intracerebral hemorrhage in her father; Lung cancer in her brother; Mental retardation in her sister; Pneumonia in her mother.  ROS:   Please see the history of present illness.     All other systems reviewed and are negative.   Labs/Other Tests and Data Reviewed:    Recent Labs: 09/21/2018: Hemoglobin 13.8; Platelets 201.0   Recent Lipid Panel Lab Results  Component Value Date/Time   CHOL 132 08/18/2014 08:21 AM   TRIG 132.0 08/18/2014 08:21 AM   HDL 35.70 (L) 08/18/2014 08:21 AM   CHOLHDL 4 08/18/2014 08:21 AM   LDLCALC 70 08/18/2014 08:21 AM    Wt Readings from Last 3 Encounters:  09/17/19 267 lb (121.1 kg)  06/22/19 275 lb (124.7 kg)  06/03/19 279 lb 9.6 oz (126.8 kg)     Objective:    Vital Signs:  BP 114/77   Pulse 80   Ht 5\' 4"  (1.626 m)   Wt 267 lb (121.1 kg)   SpO2 95%   BMI 45.83 kg/m    CONSTITUTIONAL:  Well nourished, well developed female in no acute distress.  EYES: anicteric MOUTH: oral mucosa is pink RESPIRATORY: Normal respiratory effort, symmetric expansion CARDIOVASCULAR: No peripheral edema SKIN: No rash, lesions or ulcers MUSCULOSKELETAL: no digital cyanosis NEURO:  Cranial Nerves II-XII grossly intact, moves all extremities PSYCH: Intact judgement and insight.  A&O x 3, Mood/affect appropriate   ASSESSMENT & PLAN:    1.  OSA - The pathophysiology of obstructive sleep apnea , it's cardiovascular consequences & modes of treatment including CPAP were discused with the patient in detail & they evidenced understanding.  The patient is tolerating PAP therapy well without any problems. The PAP download was reviewed today and showed an AHI of 1.1/hr on 12 cm H2O with 100% compliance in using more than 4 hours nightly.  The patient has been using and benefiting from PAP use and will continue to benefit from therapy.  2.  HTN -BP controlled -she was instructed to continue on Carvedilol 25mg  BID, HCTZ 25mg  daily  3.  Obesity -I have encouraged her to get into a routine exercise program with her stationary bike -she has started on an exercise program   COVID-19 Education: The signs and symptoms of COVID-19 were discussed with the patient and how to seek care for testing (follow up with PCP or arrange E-visit).  The importance of social distancing was discussed today.  Patient Risk:   After full review of this patient's clinical status, I feel that they are at least moderate risk at this time.  Time:   Today, I have spent 20 minutes on telemedicine discussing medical problems including OSA, HTN, obesity as well as reviewing patient's chart including sleep study, CPAP titration and PAP compliance download in [place].   Medication Adjustments/Labs and Tests Ordered: Current medicines are reviewed at length with the patient today.  Concerns regarding medicines are outlined above.  Tests Ordered: No orders of the defined types were placed in this encounter.  Medication Changes: No orders of the defined types were placed in this encounter.   Disposition:  Follow up in 1 year(s)  Signed, Fransico Him, MD  09/17/2019 8:07 AM    [facility]

## 2019-09-17 ENCOUNTER — Other Ambulatory Visit: Payer: Self-pay

## 2019-09-17 ENCOUNTER — Telehealth (INDEPENDENT_AMBULATORY_CARE_PROVIDER_SITE_OTHER): Payer: Medicare Other | Admitting: Cardiology

## 2019-09-17 VITALS — BP 114/77 | HR 80 | Ht 64.0 in | Wt 267.0 lb

## 2019-09-17 DIAGNOSIS — E669 Obesity, unspecified: Secondary | ICD-10-CM | POA: Diagnosis not present

## 2019-09-17 DIAGNOSIS — I10 Essential (primary) hypertension: Secondary | ICD-10-CM

## 2019-09-17 DIAGNOSIS — G4733 Obstructive sleep apnea (adult) (pediatric): Secondary | ICD-10-CM | POA: Diagnosis not present

## 2019-09-17 NOTE — Patient Instructions (Signed)
Medication Instructions:  Your physician recommends that you continue on your current medications as directed. Please refer to the Current Medication list given to you today.  *If you need a refill on your cardiac medications before your next appointment, please call your pharmacy*  Lab Work: none If you have labs (blood work) drawn today and your tests are completely normal, you will receive your results only by: Marland Kitchen MyChart Message (if you have MyChart) OR . A paper copy in the mail If you have any lab test that is abnormal or we need to change your treatment, we will call you to review the results.  Testing/Procedures: none  Follow-Up: At [facility], you and your health needs are our priority.  As part of our continuing mission to provide you with exceptional heart care, we have created designated Provider Care Teams.  These Care Teams include your primary Cardiologist (physician) and Advanced Practice Providers (APPs -  Physician Assistants and Nurse Practitioners) who all work together to provide you with the care you need, when you need it.  Your next appointment:   1 year  The format for your next appointment:   In person or virtual Provider:   Armanda Magic, MD  Other Instructions

## 2019-09-20 ENCOUNTER — Telehealth: Payer: Self-pay | Admitting: *Deleted

## 2019-09-20 NOTE — Telephone Encounter (Signed)
Informed patient of compliance results and verbalized understanding was indicated. Patient is aware and agreeable to AHI being within range at 1.1. Patient is aware and agreeable to being in compliance with machine usage Patient is aware and agreeable to no change in current pressures 

## 2019-09-20 NOTE — Telephone Encounter (Signed)
-----   Message from Quintella Reichert, MD sent at 09/18/2019  4:47 PM EST ----- Regarding: RE: D/L Please let patient know that download looks great!  Traci ----- Message ----- From: Reesa Chew, CMA Sent: 09/18/2019   8:58 AM EST To: Quintella Reichert, MD Subject: Karl Pock 08/18/2019 - 09/16/2019 DOB: 1946-02-14 Age: 74 years [facility] [address] [place], [address] Phone: [phone] Email: [email or web address] Compliance Report Usage 08/18/2019 - 09/16/2019 Usage days 30/30 days (100%) >= 4 hours 30 days (100%) < 4 hours 0 days (0%) Usage hours 246 hours 43 minutes Average usage (total days) 8 hours 13 minutes Average usage (days used) 8 hours 13 minutes Median usage (days used) 8 hours 31 minutes Total used hours (value since last reset - 09/16/2019) 421 hours AirSense 10 AutoSet Serial number [id number] Mode CPAP Set pressure 12 cmH2O EPR Fulltime EPR level 3 Therapy Leaks - L/min Median: 2.7 95th percentile: 8.2 Maximum: 12.4 Events per hour AI: 0.6 HI: 0.5 AHI: 1.1 Apnea Index Central: 0.2 Obstructive: 0.3 Unknown: 0.0 RERA Index 0.8 Cheyne-Stokes respiration (average duration per night) 0 minutes (0%) Usage - hours Printed on 09/18/2019 - ResMed AirView version 4.21.1-2.0 P

## 2019-10-08 ENCOUNTER — Ambulatory Visit (INDEPENDENT_AMBULATORY_CARE_PROVIDER_SITE_OTHER): Payer: Medicare Other | Admitting: *Deleted

## 2019-10-08 ENCOUNTER — Other Ambulatory Visit: Payer: Self-pay

## 2019-10-08 DIAGNOSIS — I4891 Unspecified atrial fibrillation: Secondary | ICD-10-CM

## 2019-10-08 DIAGNOSIS — Z7901 Long term (current) use of anticoagulants: Secondary | ICD-10-CM | POA: Diagnosis not present

## 2019-10-08 DIAGNOSIS — Z5181 Encounter for therapeutic drug level monitoring: Secondary | ICD-10-CM | POA: Diagnosis not present

## 2019-10-08 LAB — POCT INR: INR: 1.9 — AB (ref 2.0–3.0)

## 2019-10-08 NOTE — Patient Instructions (Signed)
Description   Take 1 tablet today and then continue on same dosage 1 tablet daily except 1/2 tablet on Mondays, Wednesdays and Fridays.  Recheck in 5 weeks.  Call if started on new meds or antibiotics [phone].

## 2019-11-12 ENCOUNTER — Other Ambulatory Visit: Payer: Self-pay

## 2019-11-12 ENCOUNTER — Ambulatory Visit (INDEPENDENT_AMBULATORY_CARE_PROVIDER_SITE_OTHER): Payer: Medicare Other

## 2019-11-12 DIAGNOSIS — Z5181 Encounter for therapeutic drug level monitoring: Secondary | ICD-10-CM | POA: Diagnosis not present

## 2019-11-12 DIAGNOSIS — Z7901 Long term (current) use of anticoagulants: Secondary | ICD-10-CM | POA: Diagnosis not present

## 2019-11-12 DIAGNOSIS — I4891 Unspecified atrial fibrillation: Secondary | ICD-10-CM | POA: Diagnosis not present

## 2019-11-12 LAB — POCT INR: INR: 2.6 (ref 2.0–3.0)

## 2019-11-12 NOTE — Patient Instructions (Signed)
Description   Continue on same dosage 1 tablet daily except 1/2 tablet on Mondays, Wednesdays and Fridays.  Recheck in 6 weeks.  Call if started on new meds or antibiotics [phone].

## 2019-12-24 ENCOUNTER — Other Ambulatory Visit: Payer: Self-pay

## 2019-12-24 ENCOUNTER — Ambulatory Visit (INDEPENDENT_AMBULATORY_CARE_PROVIDER_SITE_OTHER): Payer: Medicare Other | Admitting: *Deleted

## 2019-12-24 DIAGNOSIS — I4891 Unspecified atrial fibrillation: Secondary | ICD-10-CM | POA: Diagnosis not present

## 2019-12-24 DIAGNOSIS — Z7901 Long term (current) use of anticoagulants: Secondary | ICD-10-CM | POA: Diagnosis not present

## 2019-12-24 DIAGNOSIS — Z5181 Encounter for therapeutic drug level monitoring: Secondary | ICD-10-CM

## 2019-12-24 LAB — POCT INR: INR: 3.3 — AB (ref 2.0–3.0)

## 2019-12-24 NOTE — Patient Instructions (Signed)
Description   Hold today's dose then continue on same dosage 1 tablet daily except 1/2 tablet on Mondays, Wednesdays, and Fridays.  Recheck in 5 weeks.  Call if started on new meds or antibiotics [phone].

## 2020-01-28 ENCOUNTER — Other Ambulatory Visit: Payer: Self-pay

## 2020-01-28 ENCOUNTER — Ambulatory Visit (INDEPENDENT_AMBULATORY_CARE_PROVIDER_SITE_OTHER): Payer: Medicare Other

## 2020-01-28 DIAGNOSIS — Z7901 Long term (current) use of anticoagulants: Secondary | ICD-10-CM | POA: Diagnosis not present

## 2020-01-28 DIAGNOSIS — I4891 Unspecified atrial fibrillation: Secondary | ICD-10-CM | POA: Diagnosis not present

## 2020-01-28 DIAGNOSIS — Z5181 Encounter for therapeutic drug level monitoring: Secondary | ICD-10-CM | POA: Diagnosis not present

## 2020-01-28 LAB — POCT INR: INR: 3 (ref 2.0–3.0)

## 2020-01-28 NOTE — Patient Instructions (Signed)
Description   Take 1/2 tablet today and tomorrow, then resume same dosage 1 tablet daily except 1/2 tablet on Mondays, Wednesdays, and Fridays.  Recheck in 6 weeks.  Call if started on new meds or antibiotics [phone].

## 2020-03-09 ENCOUNTER — Encounter: Payer: Self-pay | Admitting: Pulmonary Disease

## 2020-03-09 ENCOUNTER — Other Ambulatory Visit: Payer: Self-pay

## 2020-03-09 ENCOUNTER — Ambulatory Visit (INDEPENDENT_AMBULATORY_CARE_PROVIDER_SITE_OTHER): Payer: Medicare Other | Admitting: Pulmonary Disease

## 2020-03-09 VITALS — BP 128/78 | HR 85 | Temp 98.1°F | Ht 64.0 in | Wt 284.8 lb

## 2020-03-09 DIAGNOSIS — J45909 Unspecified asthma, uncomplicated: Secondary | ICD-10-CM | POA: Diagnosis not present

## 2020-03-09 NOTE — Progress Notes (Signed)
Tammy Robbins    [id number]    Jan 04, 1946  Primary Care Physician:Roberts, Windy Fast, MD  Referring Physician: Burton Apley, MD [address] [place],  [place] [address]  Chief complaint: Follow-up for mild asthma  HPI: 74 year old never smoker with history of tension, A. fib, aortic stenosis, GERD, hypothyroidism, diastolic dysfunction.  Presenting with cough, wheezing, dyspnea for the past 1 month.  Symptoms started after upper respiratory tract infection worsened during Thanksgiving break.  Seen at primary care and given prednisone taper.  She is also on Keflex for UTI.  She saw primary care yesterday with a chest x-ray which showed no acute lung infiltrate.  Keflex dose was increased.  She has no previous history of lung issues and follows with Dr. Melburn Popper, cardiology.  She had cardiac catheterization in the past with no evidence of coronary artery disease.  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  Interim history: Feeling well.  No more episodes of bronchitis. Continues on Advair and hardly needs to use her rescue inhaler  Diagnosed with severe sleep apnea by cardiology and started on CPAP.  She is tolerating therapy well.  Outpatient Encounter Medications as of 03/09/2020  Medication Sig  . allopurinol (ZYLOPRIM) 300 MG tablet Take 300 mg by mouth daily.  . carvedilol (COREG) 25 MG tablet Take 1 tablet (25 mg total) by mouth 2 (two) times daily.  . cephALEXin (KEFLEX) 250 MG capsule Take 250 mg by mouth daily.  . Clidinium-Chlordiazepoxide (LIBRAX PO) Take by mouth 2 (two) times daily as needed.   . fish oil-omega-3 fatty acids 1000 MG capsule Take by mouth daily.    . fluticasone-salmeterol (ADVAIR HFA) 115-21 MCG/ACT inhaler  Inhale 2 puffs into the lungs 2 (two) times daily.  . hydrochlorothiazide (HYDRODIURIL) 25 MG tablet TAKE 1 TABLET BY MOUTH EVERY DAY  . levothyroxine (SYNTHROID, LEVOTHROID) 75 MCG tablet Take 75 mcg by mouth daily before breakfast.  . meclizine (ANTIVERT) 12.5 MG tablet Take 12.5 mg by mouth 3 (three) times daily as needed for dizziness.  . Multiple Vitamin (MULTIVITAMIN) tablet Take 1 tablet by mouth daily.    . nitroGLYCERIN (NITROSTAT) 0.4 MG SL tablet Place 1 tablet (0.4 mg total) under the tongue every 5 (five) minutes as needed.  Marland Kitchen omeprazole (PRILOSEC) 20 MG capsule Take 20 mg by mouth daily.    . potassium chloride (K-DUR) 10 MEQ tablet TAKE 1 TABLET BY MOUTH 2 (TWO) TIMES DAILY. PT MUST KEEP UPCOMING APPT IN AUG TO GET FURTHER REFILLS  . simvastatin (ZOCOR) 20 MG tablet Take 20 mg by mouth at bedtime.    Marland Kitchen warfarin (COUMADIN) 5 MG tablet TAKE AS DIRECTED BY COUMADIN CLINIC   No facility-administered encounter medications on file as of 03/09/2020.   Physical Exam: Blood pressure 128/78, pulse 85, temperature 98.1 F (36.7 C), temperature source Oral, height 5\' 4"  (1.626 m), weight (!) 284 lb 12.8 oz (129.2 kg), SpO2 100 %. Gen:      No acute distress HEENT:  EOMI, sclera anicteric Neck:     No masses; no thyromegaly Lungs:    Clear to auscultation bilaterally; normal respiratory effort CV:         Regular rate and rhythm; no murmurs Abd:      + bowel  sounds; soft, non-tender; no palpable masses, no distension Ext:    No edema; adequate peripheral perfusion Skin:      Warm and dry; no rash Neuro: alert and oriented x 3 Psych: normal mood and affect  Data Reviewed: Imaging: CT abdomen pelvis 02/08/15- visualized lung bases are clear. Chest x-ray 07/14/2018- clear lungs with no acute cardiopulmonary abnormality. Chest x-ray 09/21/2018- right perihilar infiltrate. Chest x-ray 10/20/2018- resolution of lung infiltrate.Tiny right pleural effusion versus pleural thickening,  unchanged. I reviewed the images personally.  PFTs: 04/12/2019-FVC 1.99 [68%], FEV1 1.58 [72%], F/F 80, TLC 4.10 [81%], DLCO 17.79 [72%] No overt obstruction but small airways disease present as suggested by curvature to flow loop, air trapping and improvement in mid flow following bronchodilator.  FENO 07/15/2018-7 FENO 09/21/2018-26  ACT score 06/03/2019-21  Labs CBC 09/21/2018-WBC 3.3, eos 4.3%, absolute eosinophil count 142 IgE 09/21/2018-38  Sleep: PSG 06/02/19- Severe OSA AHI 60, severe desaturation to 70% CPAP titration-recommended CPAP 12 cm.  Assessment:  Mild asthma Stable on Advair inhaler Continue over-the-counter antihistamine, steroid nasal spray  Symptoms are under good control.  We will wean her off Advair inhaler.  If she is doing well on return visit then she can be discharged from pulmonary clinic  GERD, postnasal drip Continue prilosec  OSA Started on CPAP for severe sleep apnea.  Follows with Dr. Mayford Knife, cardiology  Encouraged weight loss  Health maintenance 05/27/2019-influenza  She has COVID-19 vaccine hesitancy.  Discussed in detail with patient and strongly encouraged her to get vaccinated.  Plan/Recommendations: -Reduce Advair to once a day for a couple of weeks and then stop altogether  Follow-up in 12 months  Chilton Greathouse MD [facility] 03/09/2020, 8:31 AM  CC: Burton Apley, MD

## 2020-03-09 NOTE — Patient Instructions (Signed)
I am glad you are doing well with your breathing and getting your sleep apnea treated You can start reducing the Advair dose.  Use 1 time a day for 2 weeks and then stop altogether If there is any worsening of breathing then go back on the Advair  I strongly recommend that you get the COVID-19 vaccine  Follow-up in 1 year.

## 2020-03-10 ENCOUNTER — Other Ambulatory Visit: Payer: Self-pay | Admitting: Cardiovascular Disease

## 2020-03-10 ENCOUNTER — Ambulatory Visit (INDEPENDENT_AMBULATORY_CARE_PROVIDER_SITE_OTHER): Payer: Medicare Other

## 2020-03-10 DIAGNOSIS — Z7901 Long term (current) use of anticoagulants: Secondary | ICD-10-CM | POA: Diagnosis not present

## 2020-03-10 DIAGNOSIS — Z5181 Encounter for therapeutic drug level monitoring: Secondary | ICD-10-CM

## 2020-03-10 DIAGNOSIS — I4891 Unspecified atrial fibrillation: Secondary | ICD-10-CM

## 2020-03-10 LAB — POCT INR: INR: 2.6 (ref 2.0–3.0)

## 2020-03-10 NOTE — Patient Instructions (Signed)
Continue taking 1 tablet daily except 1/2 tablet on Mondays, Wednesdays, and Fridays.  Recheck in 6 weeks.  Call if started on new meds or antibiotics [phone].

## 2020-03-17 ENCOUNTER — Other Ambulatory Visit: Payer: Self-pay | Admitting: Cardiovascular Disease

## 2020-03-19 ENCOUNTER — Encounter: Payer: Self-pay | Admitting: Cardiovascular Disease

## 2020-03-19 NOTE — Progress Notes (Signed)
Tammy Robbins Date of Birth  01-09-1946 [facility]     [place] [facility]  [address]    [address]   [address] [place], [place]  [address]    [place], [place]  [address] [phone]  Fax  [phone]  [phone]  Fax [phone]  Problems: 1. Atrial fibrillation 3. Hypertension 3. Hyperlipidemia 4. History chest pain-smooth and normal coronary arteries by cardiac catheterization   5. Hypothyroidism 6. Arthritis 7. Aortic stenosis - mild  8. obesity    Tammy Robbins is a 74 year old female with a history of atrial fibrillation. She's been on chronic Coumadin therapy. She's had a cardiac catheterization in the past which revealed smooth and normal coronary arteries. She also has a history of hypertension and hyperlipidemia. She has mild aortic stenosis. She's done well from a cardiac standpoint. She has not had any episodes of chest pain. She has had an upper respiratory tract infection since Thanksgiving.  September 10, 2102: Tammy Robbins has a history of atrial fibrillation. She's been on chronic Coumadin therapy. Her INR levels here.  She's been having some problems with arthritis recently. She denies any cardiac problems. She denies any chest pain or shortness breath or palpitations. She remains active but is not getting any regular exercise.  March 10, 2013:  Tammy Robbins is doing well. She is doing low impact exercises.    No CP or dyspnea.    Jan. 28, 2015:  Tammy Robbins is doing well.  Stays active.  Exercises regularly. No symptoms.   iNR levels are ok.  Did not take BP meds this am.  Cholesterol is managed by Dr. Su Hilt.    Jan. 7, 2016:  Tammy Robbins is a 74 year old female with a history of atrial fibrillation. She's been on chronic Coumadin therapy She has episodes of chest pain on a very rare basis.  Had a cath which showed normal coronaries.   Her NTG has expired. Has recurrent UTIs, seeing a urologist.  Jan. 18, 2017:  Doing well. Lots of fatigue. INR was 3.9  today .  Not getting much exercise.     October 24, 2016:  Tammy Robbins is seen today  For follow up of her atrial fib .   CHADS2VASC is  58   ( female, age 61 , HTN)  Has chronic AF.   Is on coumadin .   No CP or limitations   November 25, 2017:  Tammy Robbins is seen today for follow-up of her atrial fibrillation. Husband passed away last 2023-01-25 .  She has had Mohs surgery for a face cancer  Is on coumadin  - INR levels look good   March 20, 2020: Tammy Robbins is seen today for follow up .  She was last  seen in April, 2019.  She has a history of atrial fibrillation and is on Coumadin.  She has a history of mild aortic stenosis, history of chest pain with normal coronary arteries by heart catheterization, hypertension, hyperlipidemia..  She has OSA and is followed by Dr. Mayford Knife for her OSA Wt is 284 lbs.  INR  levels revewed and are theraputic  BP is a little elevated.   She has not taken her meds.  Has not lost any weight .. Dr Su Hilt is managing her lipids but she has not seen him in a while    Current Outpatient Medications on File Prior to Visit  Medication Sig Dispense Refill  . allopurinol (ZYLOPRIM) 300 MG tablet Take 300 mg by mouth daily.    Marland Kitchen  carvedilol (COREG) 25 MG tablet Take 1 tablet (25 mg total) by mouth 2 (two) times daily. Please keep upcoming appt in August with Dr. Elease Hashimoto for future refills. Thank you 180 tablet 0  . cephALEXin (KEFLEX) 250 MG capsule Take 250 mg by mouth daily.    . Clidinium-Chlordiazepoxide (LIBRAX PO) Take by mouth 2 (two) times daily as needed.     . fish oil-omega-3 fatty acids 1000 MG capsule Take by mouth daily.      . fluticasone-salmeterol (ADVAIR HFA) 115-21 MCG/ACT inhaler Inhale 2 puffs into the lungs 2 (two) times daily. 1 Inhaler 12  . hydrochlorothiazide (HYDRODIURIL) 25 MG tablet TAKE 1 TABLET BY MOUTH EVERY DAY 90 tablet 0  . levothyroxine (SYNTHROID, LEVOTHROID) 75 MCG tablet Take 75 mcg by mouth daily before breakfast.    . meclizine (ANTIVERT) 12.5 MG  tablet Take 12.5 mg by mouth 3 (three) times daily as needed for dizziness.    . Multiple Vitamin (MULTIVITAMIN) tablet Take 1 tablet by mouth daily.      . nitroGLYCERIN (NITROSTAT) 0.4 MG SL tablet Place 1 tablet (0.4 mg total) under the tongue every 5 (five) minutes as needed. 25 tablet 6  . omeprazole (PRILOSEC) 20 MG capsule Take 20 mg by mouth daily.      . potassium chloride (KLOR-CON) 10 MEQ tablet Take 1 tablet (10 mEq total) by mouth 2 (two) times daily. Please keep upcoming appt in August with Dr. Elease Hashimoto for future refills. Thank you 180 tablet 0  . simvastatin (ZOCOR) 20 MG tablet Take 20 mg by mouth at bedtime.      Marland Kitchen warfarin (COUMADIN) 5 MG tablet TAKE AS DIRECTED BY COUMADIN CLINIC 90 tablet 1   No current facility-administered medications on file prior to visit.    Allergies  Allergen Reactions  . Diflucan [Fluconazole] Rash    Past Medical History:  Diagnosis Date  . Anxiety   . Atrial fibrillation (HCC)   . Chest tightness   . Diastolic dysfunction   . HTN (hypertension)   . Hyperlipemia   . Mild aortic stenosis   . Mild tricuspid regurgitation     Past Surgical History:  Procedure Laterality Date  . ABDOMINAL HYSTERECTOMY    . BUNIONECTOMY    . CARDIAC CATHETERIZATION     NORMAL  . CATARACT EXTRACTION    . CHOLECYSTECTOMY    . EYE SURGERY    . JOINT REPLACEMENT    . PARTIAL HYSTERECTOMY    . TOTAL KNEE ARTHROPLASTY     LEFT    Social History   Tobacco Use  Smoking Status Never Smoker  Smokeless Tobacco Never Used    Social History   Substance and Sexual Activity  Alcohol Use Yes   Comment: occassionally    Family History  Problem Relation Age of Onset  . Intracerebral hemorrhage Father   . Hypertension Father   . Pneumonia Mother   . Mental retardation Sister   . Lung cancer Brother     Reviw of Systems:   Noted in current history, otherwise systems are negative.  Physical Exam: Blood pressure (!) 142/82, pulse 91, height 5\' 4"   (1.626 m), weight 284 lb (128.8 kg), SpO2 97 %.  GEN:   Elderly female, morbidly obese HEENT: Normal NECK: No JVD; No carotid bruits LYMPHATICS: No lymphadenopathy CARDIAC: Irreg Irreg. ,  Soft systolic murmur  RESPIRATORY:  Clear to auscultation without rales, wheezing or rhonchi  ABDOMEN: Soft, non-tender, non-distended MUSCULOSKELETAL:  No edema; No deformity  SKIN: Warm and dry NEUROLOGIC:  Alert and oriented x 3   ECG: March 20, 2020: Atrial fibrillation with a heart rate of 91.  No ST or T wave changes.  Assessment / Plan:   1. Atrial fibrillation-    she has longstanding persistent atrial fibrillation.  Her heart rate is well controlled.  She is on Coumadin and her INR levels have been therapeutic.  Continue current medications.  3. Hypertension -   blood pressure is minimally elevated today because she has not taken her medicines yet.  She says is usually in the normal range when she takes her pills.  I strongly encouraged her to work on a weight loss program.  I have advised her to stay away from things that are white, wheat, sweet.  3. Hyperlipidemia -she is on simvastatin 20 mg a day.  We will check labs today.  Lipid profile, basic metabolic profile, liver enzymes  4. History chest pain-   5. Hypothyroidism 6. Arthritis 7. Aortic stenosis - mild  8. Obesity - advised her to get more exercise  - starting  water aerobics. 9. Fatigue:        Kristeen Miss, MD  03/20/2020 8:26 AM    [facility] [address] [place],  [address] [place], [place]  [address] Pager [phone] Phone: [phone]; Fax: [phone]

## 2020-03-20 ENCOUNTER — Encounter: Payer: Self-pay | Admitting: Cardiovascular Disease

## 2020-03-20 ENCOUNTER — Ambulatory Visit (INDEPENDENT_AMBULATORY_CARE_PROVIDER_SITE_OTHER): Payer: Medicare Other | Admitting: Cardiovascular Disease

## 2020-03-20 ENCOUNTER — Other Ambulatory Visit: Payer: Self-pay

## 2020-03-20 VITALS — BP 142/82 | HR 91 | Ht 64.0 in | Wt 284.0 lb

## 2020-03-20 DIAGNOSIS — I4811 Longstanding persistent atrial fibrillation: Secondary | ICD-10-CM | POA: Diagnosis not present

## 2020-03-20 DIAGNOSIS — Z7901 Long term (current) use of anticoagulants: Secondary | ICD-10-CM

## 2020-03-20 DIAGNOSIS — I10 Essential (primary) hypertension: Secondary | ICD-10-CM | POA: Diagnosis not present

## 2020-03-20 DIAGNOSIS — E669 Obesity, unspecified: Secondary | ICD-10-CM | POA: Diagnosis not present

## 2020-03-20 DIAGNOSIS — E785 Hyperlipidemia, unspecified: Secondary | ICD-10-CM

## 2020-03-20 LAB — HEPATIC FUNCTION PANEL
ALT: 15 IU/L (ref 0–32)
AST: 18 IU/L (ref 0–40)
Albumin: 3.9 g/dL (ref 3.7–4.7)
Alkaline Phosphatase: 86 IU/L (ref 48–121)
Bilirubin Total: 0.3 mg/dL (ref 0.0–1.2)
Bilirubin, Direct: 0.13 mg/dL (ref 0.00–0.40)
Total Protein: 6.6 g/dL (ref 6.0–8.5)

## 2020-03-20 LAB — BASIC METABOLIC PANEL
BUN/Creatinine Ratio: 21 (ref 12–28)
BUN: 18 mg/dL (ref 8–27)
CO2: 26 mmol/L (ref 20–29)
Calcium: 9.4 mg/dL (ref 8.7–10.3)
Chloride: 101 mmol/L (ref 96–106)
Creatinine, Ser: 0.86 mg/dL (ref 0.57–1.00)
GFR calc Af Amer: 78 mL/min/{1.73_m2} (ref >59)
GFR calc non Af Amer: 67 mL/min/{1.73_m2} (ref >59)
Glucose: 103 mg/dL — ABNORMAL HIGH (ref 65–99)
Potassium: 3.9 mmol/L (ref 3.5–5.2)
Sodium: 138 mmol/L (ref 134–144)

## 2020-03-20 LAB — LIPID PANEL
Chol/HDL Ratio: 2.5 ratio (ref 0.0–4.4)
Cholesterol, Total: 118 mg/dL (ref 100–199)
HDL: 47 mg/dL (ref >39)
LDL Chol Calc (NIH): 57 mg/dL (ref 0–99)
Triglycerides: 69 mg/dL (ref 0–149)
VLDL Cholesterol Cal: 14 mg/dL (ref 5–40)

## 2020-03-20 NOTE — Patient Instructions (Signed)
Medication Instructions:  Your physician recommends that you continue on your current medications as directed. Please refer to the Current Medication list given to you today.  *If you need a refill on your cardiac medications before your next appointment, please call your pharmacy*   Lab Work: TODAY - cholesterol, liver panel, basic metabolic panel If you have labs (blood work) drawn today and your tests are completely normal, you will receive your results only by: MyChart Message (if you have MyChart) OR A paper copy in the mail If you have any lab test that is abnormal or we need to change your treatment, we will call you to review the results.   Testing/Procedures: None Ordered   Follow-Up: At [facility], you and your health needs are our priority.  As part of our continuing mission to provide you with exceptional heart care, we have created designated Provider Care Teams.  These Care Teams include your primary Cardiologist (physician) and Advanced Practice Providers (APPs -  Physician Assistants and Nurse Practitioners) who all work together to provide you with the care you need, when you need it.  Your next appointment:   1 year(s)  The format for your next appointment:   In Person  Provider:   You may see Philip Nahser, MD or one of the following Advanced Practice Providers on your designated Care Team:   Scott Weaver, PA-C Vin Bhagat, PA-C   

## 2020-03-25 ENCOUNTER — Other Ambulatory Visit: Payer: Self-pay | Admitting: Cardiovascular Disease

## 2020-04-02 ENCOUNTER — Other Ambulatory Visit: Payer: Self-pay

## 2020-04-02 ENCOUNTER — Ambulatory Visit
Admission: EM | Admit: 2020-04-02 | Discharge: 2020-04-02 | Disposition: A | Discharge status: Home / Self Care | Payer: Medicare Other | Attending: Physician Assistant | Admitting: Physician Assistant

## 2020-04-02 DIAGNOSIS — B029 Zoster without complications: Secondary | ICD-10-CM | POA: Diagnosis not present

## 2020-04-02 MED ORDER — VALACYCLOVIR HCL 1 G PO TABS: 1000.0000 mg | ORAL_TABLET | Freq: Three times a day (TID) | ORAL | 0 refills | Status: AC

## 2020-04-02 MED ORDER — TRAMADOL HCL 50 MG PO TABS: 50.0000 mg | ORAL_TABLET | Freq: Three times a day (TID) | ORAL | 0 refills | Status: DC | PRN

## 2020-04-02 NOTE — ED Provider Notes (Signed)
[facility]    CSN: [id number] Arrival date & time: 04/02/20  0843      History   Chief Complaint Chief Complaint  Patient presents with  . Rash    HPI Tammy Robbins is a 74 y.o. female.   74 year old female comes in for 4 day history of rash to the left back/abdomen. States started having pain 1 week ago to the back, and thought she pulled a muscle. Now with rash that is painful. No itching, new product changes. Denies fever. Had URI symptoms prior to symptom onset, this has resolved. Denies nausea, vomiting, headaches.      Past Medical History:  Diagnosis Date  . Anxiety   . Atrial fibrillation (HCC)   . Chest tightness   . Diastolic dysfunction   . HTN (hypertension)   . Hyperlipemia   . Mild aortic stenosis   . Mild tricuspid regurgitation     Patient Active Problem List   Diagnosis Date Noted  . Mild intermittent asthma without complication 04/12/2019  . Restrictive lung disease 04/12/2019  . Healthcare maintenance 04/12/2019  . At risk for obstructive sleep apnea 04/12/2019  . GERD (gastroesophageal reflux disease) 10/08/2016  . Gout 10/08/2016  . Hypothyroidism 10/08/2016  . IBS (irritable bowel syndrome) 10/08/2016  . Aortic valve disorder 07/29/2014  . Encounter for therapeutic drug monitoring 09/08/2013  . Chest tightness   . Essential hypertension   . Other and unspecified hyperlipidemia   . Atrial fibrillation (HCC)   . Mild aortic stenosis   . Diastolic dysfunction   . Mild tricuspid regurgitation   . Anxiety state     Past Surgical History:  Procedure Laterality Date  . ABDOMINAL HYSTERECTOMY    . BUNIONECTOMY    . CARDIAC CATHETERIZATION     NORMAL  . CATARACT EXTRACTION    . CHOLECYSTECTOMY    . EYE SURGERY    . JOINT REPLACEMENT    . PARTIAL HYSTERECTOMY    . TOTAL KNEE ARTHROPLASTY     LEFT    OB History   No obstetric history on file.      Home Medications    Prior to Admission medications   Medication  Sig Start Date End Date Taking? Authorizing Provider  allopurinol (ZYLOPRIM) 300 MG tablet Take 300 mg by mouth daily.    [provider]  carvedilol (COREG) 25 MG tablet Take 1 tablet (25 mg total) by mouth 2 (two) times daily. Please keep upcoming appt in August with Dr. Elease Hashimoto for future refills. Thank you 03/17/20   Nahser, Deloris Ping, MD  cephALEXin (KEFLEX) 250 MG capsule Take 250 mg by mouth daily.    [provider]  Clidinium-Chlordiazepoxide (LIBRAX PO) Take by mouth 2 (two) times daily as needed.     [provider]  fish oil-omega-3 fatty acids 1000 MG capsule Take by mouth daily.      [provider]  fluticasone-salmeterol (ADVAIR HFA) 115-21 MCG/ACT inhaler Inhale 2 puffs into the lungs 2 (two) times daily. 04/30/19   Coral Ceo, NP  hydrochlorothiazide (HYDRODIURIL) 25 MG tablet TAKE 1 TABLET BY MOUTH EVERY DAY 03/13/20   Nahser, Deloris Ping, MD  levothyroxine (SYNTHROID, LEVOTHROID) 75 MCG tablet Take 75 mcg by mouth daily before breakfast.    [provider]  meclizine (ANTIVERT) 12.5 MG tablet Take 12.5 mg by mouth 3 (three) times daily as needed for dizziness.    [provider]  Multiple Vitamin (MULTIVITAMIN) tablet Take 1 tablet  by mouth daily.      [provider]  nitroGLYCERIN (NITROSTAT) 0.4 MG SL tablet Place 1 tablet (0.4 mg total) under the tongue every 5 (five) minutes as needed. 08/18/14   Nahser, Deloris Ping, MD  omeprazole (PRILOSEC) 20 MG capsule Take 20 mg by mouth daily.      [provider]  potassium chloride (KLOR-CON) 10 MEQ tablet Take 1 tablet (10 mEq total) by mouth 2 (two) times daily. Please keep upcoming appt in August with Dr. Elease Hashimoto for future refills. Thank you 03/17/20   Nahser, Deloris Ping, MD  simvastatin (ZOCOR) 20 MG tablet Take 20 mg by mouth at bedtime.      [provider]  traMADol (ULTRAM) 50 MG tablet Take 1 tablet (50 mg total) by mouth every 8 (eight) hours as needed. 04/02/20    Cathie Hoops, Shaneice Barsanti V, PA-C  valACYclovir (VALTREX) 1000 MG tablet Take 1 tablet (1,000 mg total) by mouth 3 (three) times daily for 7 days. 04/02/20 04/09/20  Belinda Fisher, PA-C  warfarin (COUMADIN) 5 MG tablet Take 1 tablets daily except 1/2 tablet on Mon, Wed, Fri or AS DIRECTED BY COUMADIN CLINIC 03/27/20   Nahser, Deloris Ping, MD    Family History Family History  Problem Relation Age of Onset  . Intracerebral hemorrhage Father   . Hypertension Father   . Pneumonia Mother   . Mental retardation Sister   . Lung cancer Brother     Social History Social History   Tobacco Use  . Smoking status: Never Smoker  . Smokeless tobacco: Never Used  Vaping Use  . Vaping Use: Never used  Substance Use Topics  . Alcohol use: Yes    Comment: occassionally  . Drug use: No     Allergies   Diflucan [fluconazole]   Review of Systems Review of Systems  Reason unable to perform ROS: See HPI as above.     Physical Exam Triage Vital Signs ED Triage Vitals  Enc Vitals Group     BP 04/02/20 0857 127/82     Pulse Rate 04/02/20 0857 85     Resp 04/02/20 0857 20     Temp 04/02/20 0857 98.1 F (36.7 C)     Temp Source 04/02/20 0857 Oral     SpO2 04/02/20 0857 94 %     Weight --      Height --      Head Circumference --      Peak Flow --      Pain Score 04/02/20 0902 8     Pain Loc --      Pain Edu? --      Excl. in GC? --    No data found.  Updated Vital Signs BP 127/82 (BP Location: Right Arm)   Pulse 85   Temp 98.1 F (36.7 C) (Oral)   Resp 20   SpO2 94%   Physical Exam Constitutional:      General: She is not in acute distress.    Appearance: Normal appearance. She is well-developed. She is not toxic-appearing or diaphoretic.  HENT:     Head: Normocephalic and atraumatic.  Eyes:     Conjunctiva/sclera: Conjunctivae normal.     Pupils: Pupils are equal, round, and reactive to light.  Pulmonary:     Effort: Pulmonary effort is normal. No respiratory distress.  Musculoskeletal:      Cervical back: Normal range of motion and neck supple.     Comments: Vesicular rash with surrounding erythema to  left low thoracic back extending around to abdomen following dermatome distribution. No warmth, induration  Skin:    General: Skin is warm and dry.  Neurological:     Mental Status: She is alert and oriented to person, place, and time.      UC Treatments / Results  Labs (all labs ordered are listed, but only abnormal results are displayed) Labs Reviewed - No data to display  EKG   Radiology No results found.  Procedures Procedures (including critical care time)  Medications Ordered in UC Medications - No data to display  Initial Impression / Assessment and Plan / UC Course  I have reviewed the triage vital signs and the nursing notes.  Pertinent labs & imaging results that were available during my care of the patient were reviewed by me and considered in my medical decision making (see chart for details).    History and exam consistent with herpes zoster.  Will start Valtrex as directed.  Patient on Coumadin, will contact INR clinic about new medication.  Tramadol as needed for pain.  To follow-up with PCP in 1 week for recheck.  Return precautions given.  Patient expresses understanding and agrees with plan.  Final Clinical Impressions(s) / UC Diagnoses   Final diagnoses:  Herpes zoster without complication   ED Prescriptions    Medication Sig Dispense Auth. Provider   valACYclovir (VALTREX) 1000 MG tablet Take 1 tablet (1,000 mg total) by mouth 3 (three) times daily for 7 days. 21 tablet Arliene Rosenow V, PA-C   traMADol (ULTRAM) 50 MG tablet Take 1 tablet (50 mg total) by mouth every 8 (eight) hours as needed. 15 tablet Belinda Fisher, PA-C     I have reviewed the PDMP during this encounter.   Belinda Fisher, PA-C 04/02/20 [phone]

## 2020-04-02 NOTE — Discharge Instructions (Signed)
Start valtrex as directed. Tylenol 1000mg  three times a day. Add tramadol as needed. Follow up with PCP in 1 week for reevaluation. If having fever, spreading rash, go to the emergency department for further evaluation.

## 2020-04-02 NOTE — ED Triage Notes (Signed)
Patient presents with rash on left side of back and round to the abdomen. She notes the rash occurred on Thursday.

## 2020-04-03 ENCOUNTER — Ambulatory Visit: Payer: Self-pay

## 2020-04-21 ENCOUNTER — Ambulatory Visit (INDEPENDENT_AMBULATORY_CARE_PROVIDER_SITE_OTHER): Payer: Medicare Other | Admitting: *Deleted

## 2020-04-21 ENCOUNTER — Other Ambulatory Visit: Payer: Self-pay

## 2020-04-21 DIAGNOSIS — I4891 Unspecified atrial fibrillation: Secondary | ICD-10-CM

## 2020-04-21 DIAGNOSIS — Z7901 Long term (current) use of anticoagulants: Secondary | ICD-10-CM

## 2020-04-21 DIAGNOSIS — Z5181 Encounter for therapeutic drug level monitoring: Secondary | ICD-10-CM | POA: Diagnosis not present

## 2020-04-21 LAB — POCT INR: INR: 3.4 — AB (ref 2.0–3.0)

## 2020-04-21 NOTE — Patient Instructions (Signed)
Description   Hold today, Continue taking 1 tablet daily except 1/2 tablet on Mondays, Wednesdays, and Fridays.  Recheck in 3 weeks.  Call if started on new meds or antibiotics [phone].

## 2020-05-12 ENCOUNTER — Other Ambulatory Visit: Payer: Self-pay

## 2020-05-12 ENCOUNTER — Ambulatory Visit (INDEPENDENT_AMBULATORY_CARE_PROVIDER_SITE_OTHER): Payer: Medicare Other | Admitting: *Deleted

## 2020-05-12 DIAGNOSIS — Z7901 Long term (current) use of anticoagulants: Secondary | ICD-10-CM | POA: Diagnosis not present

## 2020-05-12 DIAGNOSIS — I4891 Unspecified atrial fibrillation: Secondary | ICD-10-CM | POA: Diagnosis not present

## 2020-05-12 DIAGNOSIS — Z5181 Encounter for therapeutic drug level monitoring: Secondary | ICD-10-CM | POA: Diagnosis not present

## 2020-05-12 LAB — POCT INR: INR: 2.4 (ref 2.0–3.0)

## 2020-05-12 NOTE — Patient Instructions (Signed)
Description   Continue taking 1 tablet daily except 1/2 tablet on Mondays, Wednesdays, and Fridays.  Recheck in 4 weeks.  Call if started on new meds or antibiotics [phone].

## 2020-06-02 ENCOUNTER — Other Ambulatory Visit: Payer: Self-pay

## 2020-06-02 ENCOUNTER — Ambulatory Visit (INDEPENDENT_AMBULATORY_CARE_PROVIDER_SITE_OTHER): Payer: Medicare Other | Admitting: Pharmacist

## 2020-06-02 DIAGNOSIS — Z5181 Encounter for therapeutic drug level monitoring: Secondary | ICD-10-CM | POA: Diagnosis not present

## 2020-06-02 DIAGNOSIS — Z7901 Long term (current) use of anticoagulants: Secondary | ICD-10-CM | POA: Diagnosis not present

## 2020-06-02 DIAGNOSIS — I4891 Unspecified atrial fibrillation: Secondary | ICD-10-CM | POA: Diagnosis not present

## 2020-06-02 LAB — POCT INR: INR: 3 (ref 2.0–3.0)

## 2020-06-02 NOTE — Patient Instructions (Addendum)
Description   Continue taking 1 tablet daily except 1/2 tablet on Mondays, Wednesdays, and Fridays.  Recheck in 6 weeks.  Call if started on new meds or antibiotics [phone].      

## 2020-06-11 ENCOUNTER — Other Ambulatory Visit: Payer: Self-pay | Admitting: Cardiovascular Disease

## 2020-07-14 ENCOUNTER — Other Ambulatory Visit: Payer: Self-pay

## 2020-07-14 ENCOUNTER — Ambulatory Visit (INDEPENDENT_AMBULATORY_CARE_PROVIDER_SITE_OTHER): Payer: Medicare Other | Admitting: *Deleted

## 2020-07-14 DIAGNOSIS — Z5181 Encounter for therapeutic drug level monitoring: Secondary | ICD-10-CM | POA: Diagnosis not present

## 2020-07-14 DIAGNOSIS — Z7901 Long term (current) use of anticoagulants: Secondary | ICD-10-CM

## 2020-07-14 DIAGNOSIS — I4891 Unspecified atrial fibrillation: Secondary | ICD-10-CM

## 2020-07-14 LAB — POCT INR: INR: 2.8 (ref 2.0–3.0)

## 2020-07-14 NOTE — Patient Instructions (Signed)
Description   Continue taking 1 tablet daily except 1/2 tablet on Mondays, Wednesdays, and Fridays.  Recheck in 6 weeks.  Call if started on new meds or antibiotics #[phone].      

## 2020-07-31 ENCOUNTER — Other Ambulatory Visit: Payer: Self-pay | Admitting: Internal Medicine

## 2020-07-31 DIAGNOSIS — Z1231 Encounter for screening mammogram for malignant neoplasm of breast: Secondary | ICD-10-CM

## 2020-08-11 IMAGING — MG DIGITAL SCREENING BILATERAL MAMMOGRAM WITH TOMO AND CAD
8 series · 8 of 24 positions shown · non-contrast
Comparison: Previous exam(s).

ACR Breast Density Category a: The breast tissue is almost entirely
fatty.

CLINICAL DATA: Screening.

EXAM:
DIGITAL SCREENING BILATERAL MAMMOGRAM WITH TOMO AND CAD

[L MLO synth-2D]
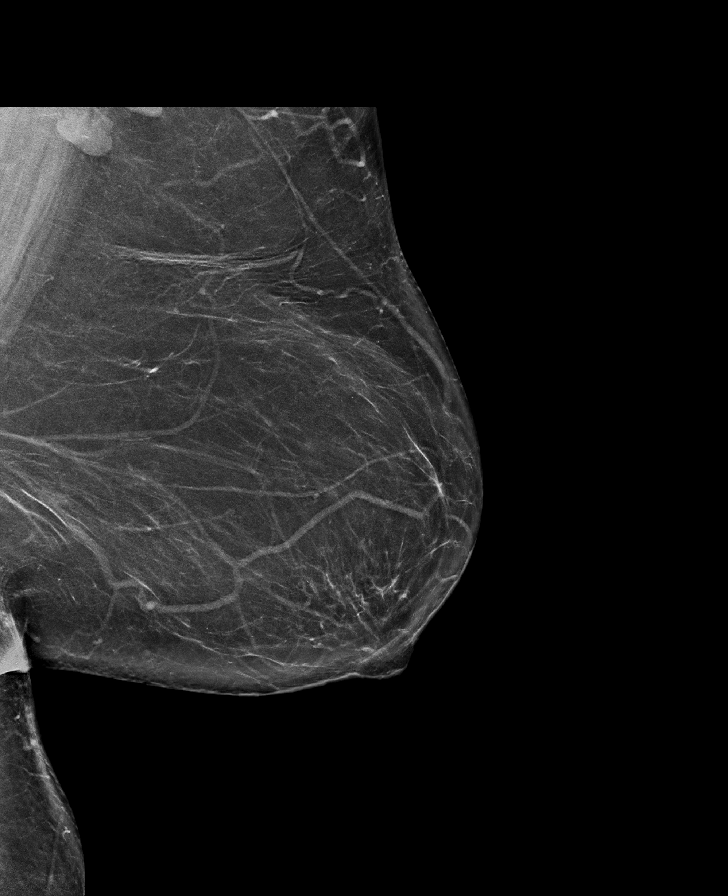

[L CC synth-2D]
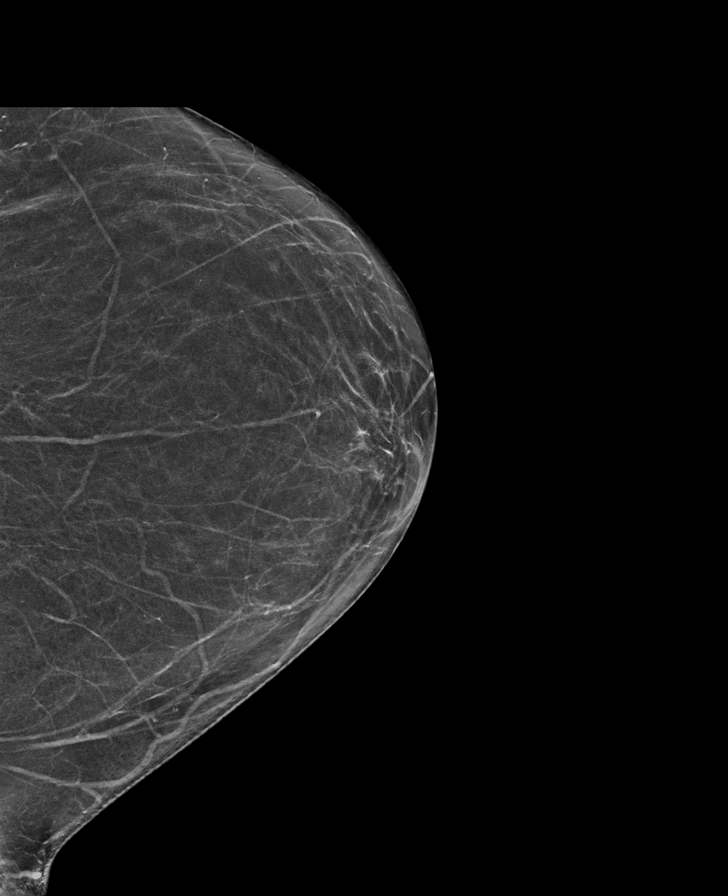

[R MLO synth-2D]
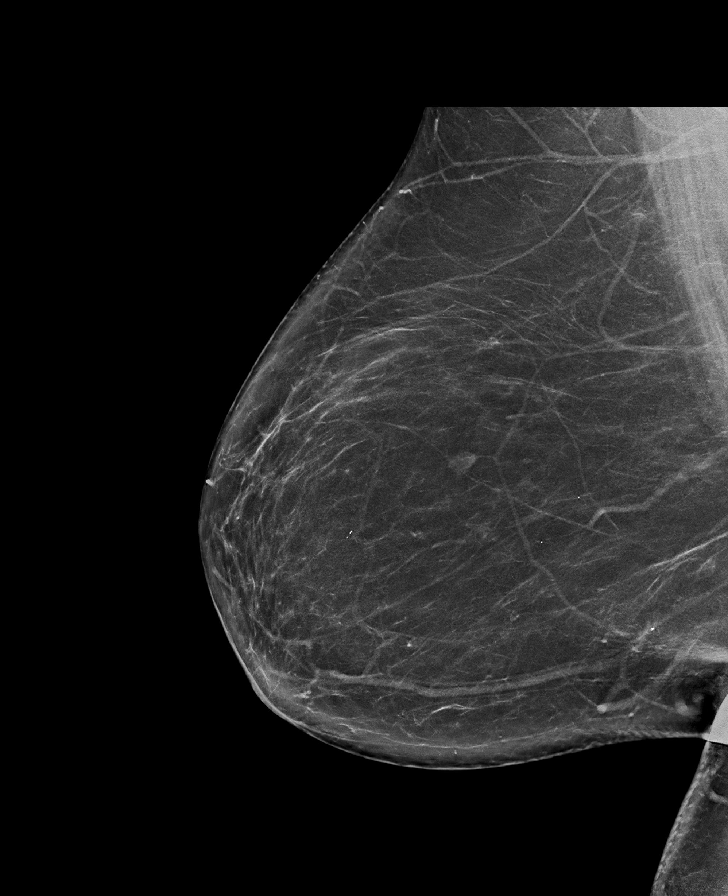

[R CC synth-2D]
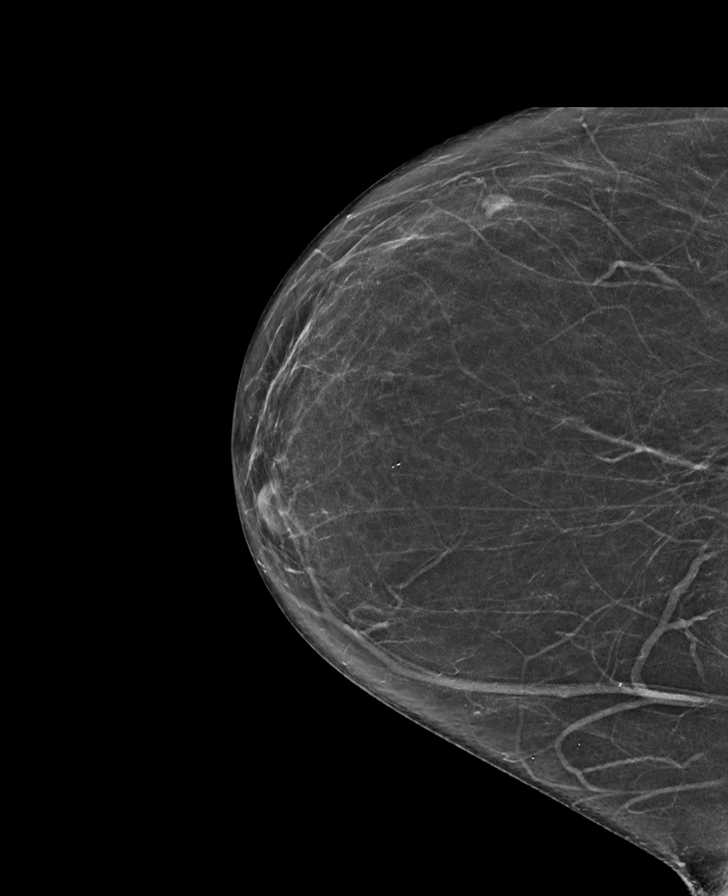

[L CC tomo · tomo slice 33/64.0]
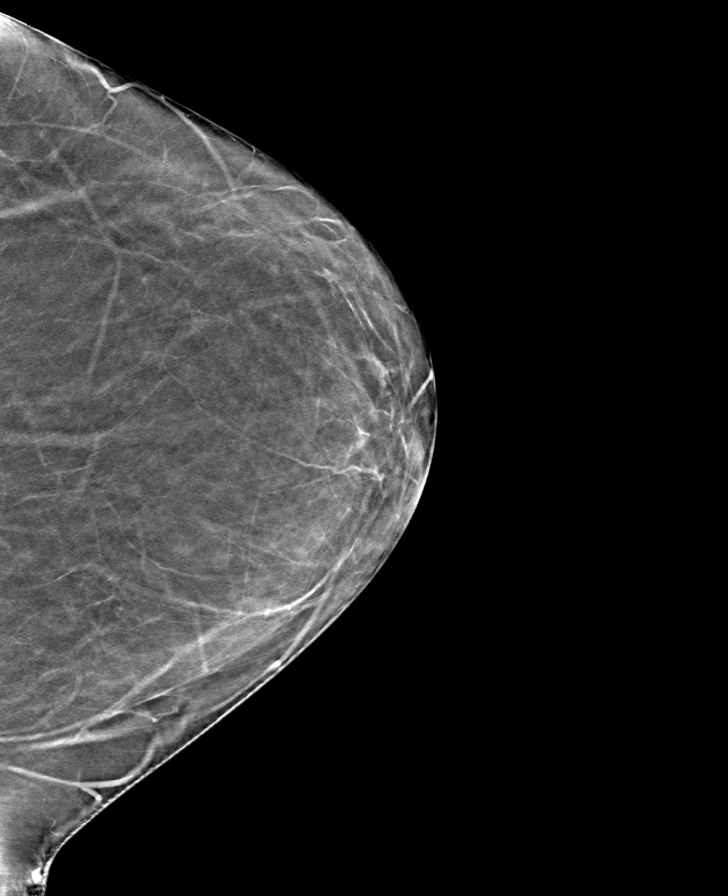

[R MLO tomo · tomo slice 43/84.0]
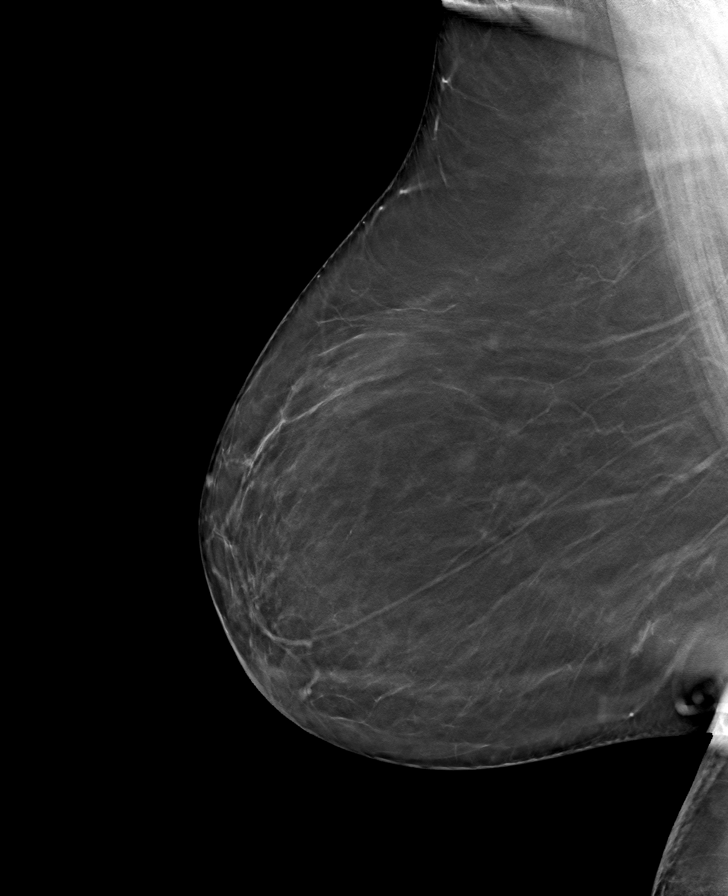

[L MLO tomo · tomo slice 40/79.0]
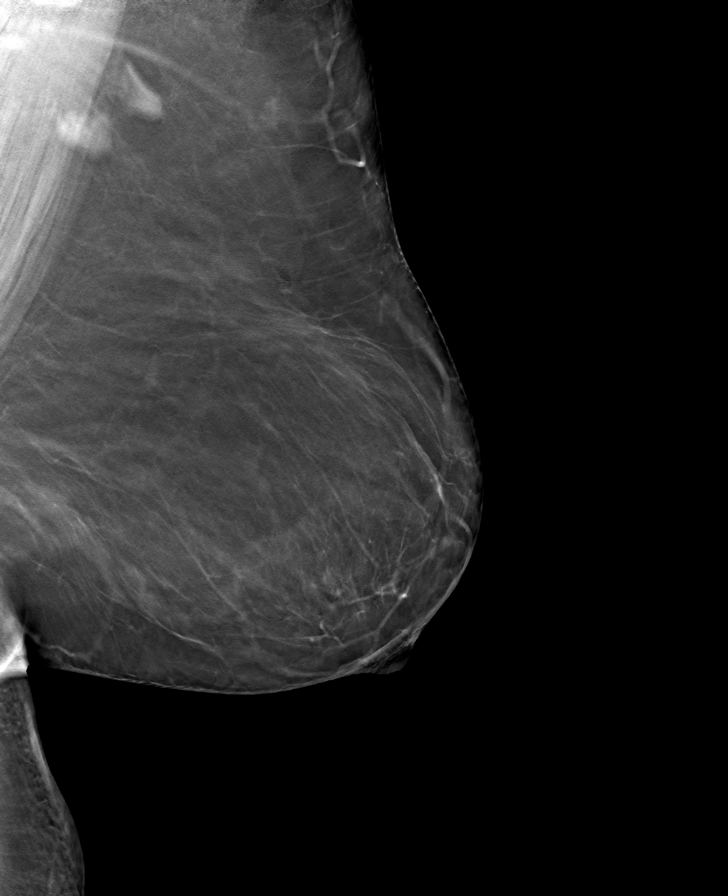

[R CC tomo · tomo slice 33/66.0]
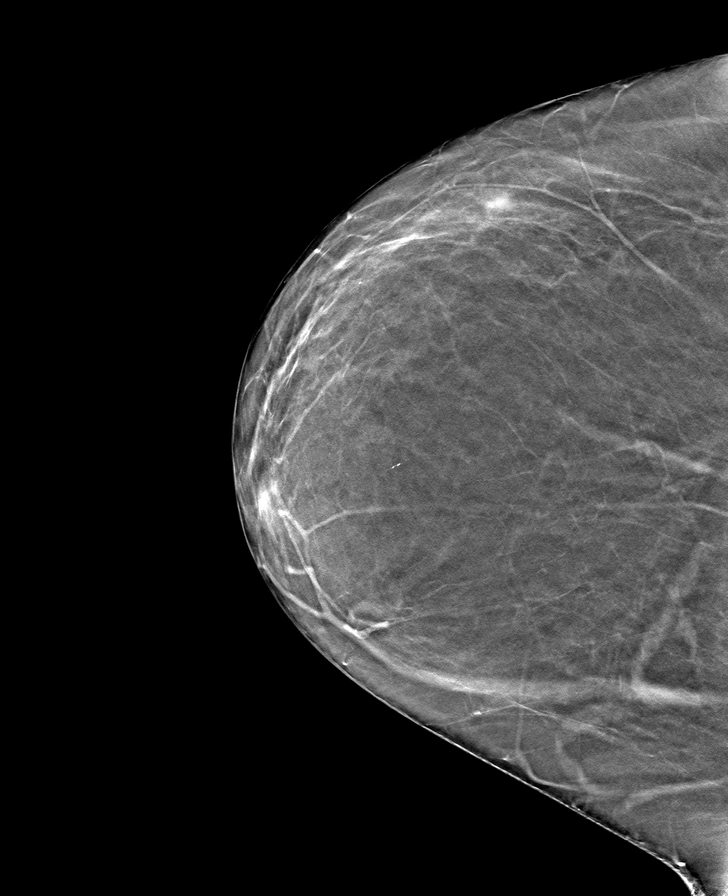

[8 of 24 positions shown; findings below may reference images not displayed]

FINDINGS: There are no findings suspicious for malignancy. Images were
processed with CAD.
IMPRESSION: No mammographic evidence of malignancy. A result letter of this
screening mammogram will be mailed directly to the patient.

RECOMMENDATION:
Screening mammogram in one year. (Code:[id number]-[id number])

BI-RADS CATEGORY  1: Negative.

## 2020-08-23 ENCOUNTER — Telehealth: Payer: Self-pay | Admitting: *Deleted

## 2020-08-23 DIAGNOSIS — G4733 Obstructive sleep apnea (adult) (pediatric): Secondary | ICD-10-CM

## 2020-08-23 NOTE — Telephone Encounter (Signed)
Order placed for cushions and cpap supplies

## 2020-08-25 ENCOUNTER — Ambulatory Visit (INDEPENDENT_AMBULATORY_CARE_PROVIDER_SITE_OTHER): Payer: Medicare Other

## 2020-08-25 ENCOUNTER — Other Ambulatory Visit: Payer: Self-pay

## 2020-08-25 DIAGNOSIS — I4891 Unspecified atrial fibrillation: Secondary | ICD-10-CM | POA: Diagnosis not present

## 2020-08-25 DIAGNOSIS — Z5181 Encounter for therapeutic drug level monitoring: Secondary | ICD-10-CM | POA: Diagnosis not present

## 2020-08-25 DIAGNOSIS — Z7901 Long term (current) use of anticoagulants: Secondary | ICD-10-CM | POA: Diagnosis not present

## 2020-08-25 LAB — POCT INR: INR: 2.9 (ref 2.0–3.0)

## 2020-08-25 NOTE — Patient Instructions (Signed)
Description   Continue taking 1 tablet daily except 1/2 tablet on Mondays, Wednesdays, and Fridays.  Recheck in 6 weeks.  Call if started on new meds or antibiotics [phone].

## 2020-09-08 ENCOUNTER — Ambulatory Visit: Payer: Medicare Other

## 2020-09-14 ENCOUNTER — Ambulatory Visit: Payer: Medicare Other | Admitting: Cardiology

## 2020-09-19 ENCOUNTER — Ambulatory Visit: Payer: Medicare Other

## 2020-09-27 ENCOUNTER — Other Ambulatory Visit: Payer: Self-pay

## 2020-09-27 ENCOUNTER — Encounter: Payer: Self-pay | Admitting: Cardiology

## 2020-09-27 ENCOUNTER — Ambulatory Visit (INDEPENDENT_AMBULATORY_CARE_PROVIDER_SITE_OTHER): Payer: Medicare Other | Admitting: Cardiology

## 2020-09-27 VITALS — BP 134/80 | HR 94 | Ht 64.0 in | Wt 284.0 lb

## 2020-09-27 DIAGNOSIS — G4733 Obstructive sleep apnea (adult) (pediatric): Secondary | ICD-10-CM

## 2020-09-27 DIAGNOSIS — I10 Essential (primary) hypertension: Secondary | ICD-10-CM

## 2020-09-27 NOTE — Patient Instructions (Signed)
Medication Instructions:  Your physician recommends that you continue on your current medications as directed. Please refer to the Current Medication list given to you today. *If you need a refill on your cardiac medications before your next appointment, please call your pharmacy*    Follow-Up: At [facility], you and your health needs are our priority.  As part of our continuing mission to provide you with exceptional heart care, we have created designated Provider Care Teams.  These Care Teams include your primary Cardiologist (physician) and Advanced Practice Providers (APPs -  Physician Assistants and Nurse Practitioners) who all work together to provide you with the care you need, when you need it.    Your next appointment:   1 year(s)  The format for your next appointment:   In Person  Provider:   Dr. Armanda Magic

## 2020-09-27 NOTE — Progress Notes (Addendum)
Date:  09/27/2020   ID:  Tammy Robbins, DOB 04-05-1946, MRN [id number]   PCP:  Burton Apley, MD  Cardiologist:  Kristeen Miss, MD  Sleep Medicine:  Armanda Magic, MD Electrophysiologist:  None   Chief Complaint:  OSA  History of Present Illness:    Tammy Robbins is a 75 y.o. female  with a hx of AS, HTN and chronic AF who was referred for sleep study.  She underwent PSG on 08/02/2019 showing severe OSA with an AHI of 60/hr with severe nocturnal hypoxemia with O2 sats as low as 70%.  She underwent CPAP titration to 12cm H2O.    She is doing well with her CPAP device and thinks that she has gotten used to it.  She tolerates the mask but occasionally has a leak.  She feels the pressure is adequate.  Since going on CPAP she feels rested in the am and has no significant daytime sleepiness.  She denies any significant mouth or nasal dryness or nasal congestion.  She does not think that he snores.  She tells me she is sleeping 8-9 hours nightly and does not have to get up to use the bathroom.  Prior CV studies:   The following studies were reviewed today:   PAP compliance download in [place]  Past Medical History:  Diagnosis Date  . Anxiety   . Chest tightness   . Chronic atrial fibrillation (HCC)   . Diastolic dysfunction   . HTN (hypertension)   . Hyperlipemia   . Mild aortic stenosis   . Mild tricuspid regurgitation    Past Surgical History:  Procedure Laterality Date  . ABDOMINAL HYSTERECTOMY    . BUNIONECTOMY    . CARDIAC CATHETERIZATION     NORMAL  . CATARACT EXTRACTION    . CHOLECYSTECTOMY    . EYE SURGERY    . JOINT REPLACEMENT    . PARTIAL HYSTERECTOMY    . TOTAL KNEE ARTHROPLASTY     LEFT     Current Meds  Medication Sig  . allopurinol (ZYLOPRIM) 300 MG tablet Take 300 mg by mouth daily.  . carvedilol (COREG) 25 MG tablet Take 1 tablet (25 mg total) by mouth 2 (two) times daily with a meal.  . cephALEXin (KEFLEX) 250 MG capsule Take 250 mg by mouth daily.  .  chlordiazePOXIDE (LIBRIUM) 10 MG capsule Take by mouth as needed.  . clotrimazole (LOTRIMIN) 1 % cream Apply topically.  . fish oil-omega-3 fatty acids 1000 MG capsule Take by mouth daily.  Marland Kitchen gabapentin (NEURONTIN) 100 MG capsule Take 100 mg by mouth as needed.  . hydrochlorothiazide (HYDRODIURIL) 25 MG tablet TAKE 1 TABLET BY MOUTH EVERY DAY  . levothyroxine (SYNTHROID, LEVOTHROID) 75 MCG tablet Take 75 mcg by mouth daily before breakfast.  . meclizine (ANTIVERT) 12.5 MG tablet Take 12.5 mg by mouth 3 (three) times daily as needed for dizziness.  . Multiple Vitamin (MULTIVITAMIN) tablet Take 1 tablet by mouth daily.  . nitroGLYCERIN (NITROSTAT) 0.4 MG SL tablet Place 1 tablet (0.4 mg total) under the tongue every 5 (five) minutes as needed.  Marland Kitchen omeprazole (PRILOSEC) 20 MG capsule Take 20 mg by mouth daily.  . potassium chloride (KLOR-CON) 10 MEQ tablet Take 1 tablet (10 mEq total) by mouth 2 (two) times daily.  . simvastatin (ZOCOR) 20 MG tablet Take 20 mg by mouth at bedtime.  Marland Kitchen warfarin (COUMADIN) 5 MG tablet Take 1 tablets daily except 1/2 tablet on Mon, Wed, Fri or AS DIRECTED BY COUMADIN  CLINIC     Allergies:   Diflucan [fluconazole]   Social History   Tobacco Use  . Smoking status: Never Smoker  . Smokeless tobacco: Never Used  Vaping Use  . Vaping Use: Never used  Substance Use Topics  . Alcohol use: Yes    Comment: occassionally  . Drug use: No     Family Hx: The patient's family history includes Hypertension in her father; Intracerebral hemorrhage in her father; Lung cancer in her brother; Mental retardation in her sister; Pneumonia in her mother.  ROS:   Please see the history of present illness.     All other systems reviewed and are negative.   Labs/Other Tests and Data Reviewed:    Recent Labs: 03/20/2020: ALT 15; BUN 18; Creatinine, Ser 0.86; Potassium 3.9; Sodium 138   Recent Lipid Panel Lab Results  Component Value Date/Time   CHOL 118 03/20/2020 08:22 AM    TRIG 69 03/20/2020 08:22 AM   HDL 47 03/20/2020 08:22 AM   CHOLHDL 2.5 03/20/2020 08:22 AM   CHOLHDL 4 08/18/2014 08:21 AM   LDLCALC 57 03/20/2020 08:22 AM    Wt Readings from Last 3 Encounters:  09/27/20 284 lb (128.8 kg)  03/20/20 284 lb (128.8 kg)  03/09/20 (!) 284 lb 12.8 oz (129.2 kg)     Objective:    Vital Signs:  BP 134/80   Pulse 94   Ht 5\' 4"  (1.626 m)   Wt 284 lb (128.8 kg)   SpO2 98%   BMI 48.75 kg/m    GEN: Well nourished, well developed in no acute distress HEENT: Normal NECK: No JVD; No carotid bruits LYMPHATICS: No lymphadenopathy CARDIAC:irregulalry irregular, no murmurs, rubs, gallops RESPIRATORY:  Clear to auscultation without rales, wheezing or rhonchi  ABDOMEN: Soft, non-tender, non-distended MUSCULOSKELETAL:  No edema; No deformity  SKIN: Warm and dry NEUROLOGIC:  Alert and oriented x 3 PSYCHIATRIC:  Normal affect    ASSESSMENT & PLAN:    1.  OSA - The patient is tolerating PAP therapy well without any problems. The PAP download was reviewed today and showed an AHI of 0.6/hr on 12 cm H2O with 100% compliance in using more than 4 hours nightly.  The patient has been using and benefiting from PAP use and will continue to benefit from therapy.   2.  HTN -BP is well controlled on exam today -continue Carvedilol 25mg  BID, HCTZ 25mg  daily  3.  Morbid Obesity -encouraged weight loss and to start walking  Medication Adjustments/Labs and Tests Ordered: Current medicines are reviewed at length with the patient today.  Concerns regarding medicines are outlined above.  Tests Ordered: No orders of the defined types were placed in this encounter.  Medication Changes: No orders of the defined types were placed in this encounter.   Disposition:  Follow up in 1 year(s)  Signed, , MD  09/27/2020 3:50 PM    [facility]

## 2020-10-06 ENCOUNTER — Other Ambulatory Visit: Payer: Self-pay

## 2020-10-06 ENCOUNTER — Ambulatory Visit (INDEPENDENT_AMBULATORY_CARE_PROVIDER_SITE_OTHER): Payer: Medicare Other | Admitting: *Deleted

## 2020-10-06 DIAGNOSIS — Z7901 Long term (current) use of anticoagulants: Secondary | ICD-10-CM

## 2020-10-06 DIAGNOSIS — I4891 Unspecified atrial fibrillation: Secondary | ICD-10-CM | POA: Diagnosis not present

## 2020-10-06 DIAGNOSIS — Z5181 Encounter for therapeutic drug level monitoring: Secondary | ICD-10-CM

## 2020-10-06 LAB — POCT INR: INR: 2.8 (ref 2.0–3.0)

## 2020-10-06 NOTE — Patient Instructions (Signed)
Description   Continue taking 1 tablet daily except 1/2 tablet on Mondays, Wednesdays, and Fridays.  Recheck in 6 weeks.  Call if started on new meds or antibiotics #[phone].      

## 2020-10-14 ENCOUNTER — Ambulatory Visit
Admission: EM | Admit: 2020-10-14 | Discharge: 2020-10-14 | Disposition: A | Discharge status: Home / Self Care | Payer: Medicare Other | Attending: Emergency Medicine | Admitting: Emergency Medicine

## 2020-10-14 ENCOUNTER — Encounter: Payer: Self-pay | Admitting: *Deleted

## 2020-10-14 ENCOUNTER — Other Ambulatory Visit: Payer: Self-pay

## 2020-10-14 DIAGNOSIS — J069 Acute upper respiratory infection, unspecified: Secondary | ICD-10-CM

## 2020-10-14 DIAGNOSIS — Z20822 Contact with and (suspected) exposure to covid-19: Secondary | ICD-10-CM | POA: Diagnosis not present

## 2020-10-14 DIAGNOSIS — R062 Wheezing: Secondary | ICD-10-CM | POA: Diagnosis not present

## 2020-10-14 MED ORDER — PREDNISONE 20 MG PO TABS: 40.0000 mg | ORAL_TABLET | Freq: Every day | ORAL | 0 refills | Status: AC

## 2020-10-14 MED ORDER — BENZONATATE 200 MG PO CAPS: 200.0000 mg | ORAL_CAPSULE | Freq: Three times a day (TID) | ORAL | 0 refills | Status: AC | PRN

## 2020-10-14 MED ORDER — DM-GUAIFENESIN ER 30-600 MG PO TB12: 1.0000 | ORAL_TABLET | Freq: Two times a day (BID) | ORAL | 0 refills | Status: DC

## 2020-10-14 MED ORDER — LORATADINE 10 MG PO TABS: 10.0000 mg | ORAL_TABLET | Freq: Every day | ORAL | 0 refills | Status: DC

## 2020-10-14 MED ORDER — ALBUTEROL SULFATE HFA 108 (90 BASE) MCG/ACT IN AERS: 1.0000 | INHALATION_SPRAY | Freq: Four times a day (QID) | RESPIRATORY_TRACT | 0 refills | Status: DC | PRN

## 2020-10-14 NOTE — Discharge Instructions (Signed)
Please use albuterol inhaler 1 to 2 puffs every 4-6 hours as needed for shortness of breath chest tightness and wheezing Prednisone 40 mg daily x5 days-take with food and earlier in the day if possible Daily Claritin to further help with congestion sneezing and drainage Mucinex DM for congestion and cough as needed Tessalon every 8 hours for cough as needed Rest and fluids Follow-up if not improving or worsening

## 2020-10-14 NOTE — ED Triage Notes (Signed)
C/O starting with "tingling" facial sinuses onset 4 days ago while at the beach. Since then c/o sneezing, runny nose, nasal congestion. Now c/o wheezing with cough now.  Denies fevers.

## 2020-10-14 NOTE — ED Provider Notes (Signed)
[facility]    CSN: [id number] Arrival date & time: 10/14/20  1207      History   Chief Complaint Chief Complaint  Patient presents with  . Facial Pain  . Cough    HPI Tammy Robbins is a 75 y.o. female history of hypertension, chronic A. fib, aortic stenosis and tricuspid regurgitation presenting today for evaluation of URI symptoms.  Reports sinus congestion cough and shortness of breath.  Reports symptoms began approximately 4 days ago.  Has developed some wheezing in the chest over the past 1 to 2 days.  Denies any chest pain or back pain.  Denies any known fevers.  HPI  Past Medical History:  Diagnosis Date  . Anxiety   . Chest tightness   . Chronic atrial fibrillation (HCC)   . Diastolic dysfunction   . HTN (hypertension)   . Hyperlipemia   . Mild aortic stenosis   . Mild tricuspid regurgitation     Patient Active Problem List   Diagnosis Date Noted  . Mild intermittent asthma without complication 04/12/2019  . Restrictive lung disease 04/12/2019  . Healthcare maintenance 04/12/2019  . At risk for obstructive sleep apnea 04/12/2019  . GERD (gastroesophageal reflux disease) 10/08/2016  . Gout 10/08/2016  . Hypothyroidism 10/08/2016  . IBS (irritable bowel syndrome) 10/08/2016  . Aortic valve disorder 07/29/2014  . Encounter for therapeutic drug monitoring 09/08/2013  . Chest tightness   . Essential hypertension   . Other and unspecified hyperlipidemia   . Atrial fibrillation (HCC)   . Mild aortic stenosis   . Diastolic dysfunction   . Mild tricuspid regurgitation   . Anxiety state     Past Surgical History:  Procedure Laterality Date  . ABDOMINAL HYSTERECTOMY    . BUNIONECTOMY    . CARDIAC CATHETERIZATION     NORMAL  . CATARACT EXTRACTION    . CHOLECYSTECTOMY    . EYE SURGERY    . JOINT REPLACEMENT    . PARTIAL HYSTERECTOMY    . TOTAL KNEE ARTHROPLASTY     LEFT    OB History   No obstetric history on file.      Home  Medications    Prior to Admission medications   Medication Sig Start Date End Date Taking? Authorizing Provider  albuterol (VENTOLIN HFA) 108 (90 Base) MCG/ACT inhaler Inhale 1-2 puffs into the lungs every 6 (six) hours as needed for wheezing or shortness of breath. 10/14/20  Yes Lekita Kerekes C, PA-C  allopurinol (ZYLOPRIM) 300 MG tablet Take 300 mg by mouth daily.   Yes [provider]  benzonatate (TESSALON) 200 MG capsule Take 1 capsule (200 mg total) by mouth 3 (three) times daily as needed for up to 7 days for cough. 10/14/20 10/21/20 Yes Lacie Landry C, PA-C  carvedilol (COREG) 25 MG tablet Take 1 tablet (25 mg total) by mouth 2 (two) times daily with a meal. 06/13/20  Yes Nahser, Deloris Ping, MD  cephALEXin (KEFLEX) 250 MG capsule Take 250 mg by mouth daily.   Yes [provider]  chlordiazePOXIDE (LIBRIUM) 10 MG capsule Take by mouth as needed. 07/13/20  Yes [provider]  clotrimazole (LOTRIMIN) 1 % cream Apply topically. 05/31/20  Yes [provider]  dextromethorphan-guaiFENesin (MUCINEX DM) 30-600 MG 12hr tablet Take 1 tablet by mouth 2 (two) times daily. 10/14/20  Yes Geraldene Eisel C, PA-C  fish oil-omega-3 fatty acids 1000 MG capsule Take by mouth daily.   Yes [provider]  gabapentin (NEURONTIN)  100 MG capsule Take 100 mg by mouth as needed. 05/16/20  Yes [provider]  hydrochlorothiazide (HYDRODIURIL) 25 MG tablet TAKE 1 TABLET BY MOUTH EVERY DAY 06/13/20  Yes Nahser, Deloris PingPhilip J, MD  levothyroxine (SYNTHROID, LEVOTHROID) 75 MCG tablet Take 75 mcg by mouth daily before breakfast.   Yes [provider]  loratadine (CLARITIN) 10 MG tablet Take 1 tablet (10 mg total) by mouth daily. 10/14/20  Yes Earline Stiner C, PA-C  meclizine (ANTIVERT) 12.5 MG tablet Take 12.5 mg by mouth 3 (three) times daily as needed for dizziness.   Yes [provider]  Multiple Vitamin (MULTIVITAMIN) tablet Take 1 tablet by mouth daily.    Yes [provider]  omeprazole (PRILOSEC) 20 MG capsule Take 20 mg by mouth daily.   Yes [provider]  potassium chloride (KLOR-CON) 10 MEQ tablet Take 1 tablet (10 mEq total) by mouth 2 (two) times daily. 06/13/20  Yes Nahser, Deloris PingPhilip J, MD  predniSONE (DELTASONE) 20 MG tablet Take 2 tablets (40 mg total) by mouth daily with breakfast for 5 days. 10/14/20 10/19/20 Yes Charlise Giovanetti C, PA-C  simvastatin (ZOCOR) 20 MG tablet Take 20 mg by mouth at bedtime.   Yes [provider]  warfarin (COUMADIN) 5 MG tablet Take 1 tablets daily except 1/2 tablet on Mon, Wed, Fri or AS DIRECTED BY COUMADIN CLINIC 03/27/20  Yes Nahser, Deloris PingPhilip J, MD  nitroGLYCERIN (NITROSTAT) 0.4 MG SL tablet Place 1 tablet (0.4 mg total) under the tongue every 5 (five) minutes as needed. 08/18/14   Nahser, Deloris PingPhilip J, MD    Family History Family History  Problem Relation Age of Onset  . Intracerebral hemorrhage Father   . Hypertension Father   . Pneumonia Mother   . Mental retardation Sister   . Lung cancer Brother     Social History Social History   Tobacco Use  . Smoking status: Never Smoker  . Smokeless tobacco: Never Used  Vaping Use  . Vaping Use: Never used  Substance Use Topics  . Alcohol use: Yes    Comment: occasionally  . Drug use: No     Allergies   Diflucan [fluconazole]   Review of Systems Review of Systems  Constitutional: Negative for activity change, appetite change, chills, fatigue and fever.  HENT: Positive for congestion, rhinorrhea and sinus pressure. Negative for ear pain, sore throat and trouble swallowing.   Eyes: Negative for discharge and redness.  Respiratory: Positive for cough and wheezing. Negative for chest tightness and shortness of breath.   Cardiovascular: Negative for chest pain.  Gastrointestinal: Negative for abdominal pain, diarrhea, nausea and vomiting.  Musculoskeletal: Negative for myalgias.  Skin: Negative for rash.  Neurological: Negative  for dizziness, light-headedness and headaches.     Physical Exam Triage Vital Signs ED Triage Vitals  Enc Vitals Group     BP      Pulse      Resp      Temp      Temp src      SpO2      Weight      Height      Head Circumference      Peak Flow      Pain Score      Pain Loc      Pain Edu?      Excl. in GC?    No data found.  Updated Vital Signs BP (!) 144/85   Pulse 81   Temp 97.9 F (36.6 C) (  Temporal)   Resp 20   SpO2 96%   Visual Acuity Right Eye Distance:   Left Eye Distance:   Bilateral Distance:    Right Eye Near:   Left Eye Near:    Bilateral Near:     Physical Exam Vitals and nursing note reviewed.  Constitutional:      Appearance: She is well-developed and well-nourished.     Comments: No acute distress  HENT:     Head: Normocephalic and atraumatic.     Ears:     Comments: Bilateral ears without tenderness to palpation of external auricle, tragus and mastoid, EAC's without erythema or swelling, TM's with good bony landmarks and cone of light. Non erythematous.     Nose: Nose normal.     Mouth/Throat:     Comments: Oral mucosa pink and moist, no tonsillar enlargement or exudate. Posterior pharynx patent and nonerythematous, no uvula deviation or swelling. Normal phonation.  Eyes:     Conjunctiva/sclera: Conjunctivae normal.  Cardiovascular:     Rate and Rhythm: Normal rate.  Pulmonary:     Effort: Pulmonary effort is normal. No respiratory distress.     Comments: Breathing comfortably at rest, faint end expiratory wheezing noted throughout bilateral lung fields Abdominal:     General: There is no distension.  Musculoskeletal:        General: Normal range of motion.     Cervical back: Neck supple.  Skin:    General: Skin is warm and dry.  Neurological:     Mental Status: She is alert and oriented to person, place, and time.  Psychiatric:        Mood and Affect: Mood and affect normal.      UC Treatments / Results  Labs (all labs  ordered are listed, but only abnormal results are displayed) Labs Reviewed  NOVEL CORONAVIRUS, NAA    EKG   Radiology No results found.  Procedures Procedures (including critical care time)  Medications Ordered in UC Medications - No data to display  Initial Impression / Assessment and Plan / UC Course  I have reviewed the triage vital signs and the nursing notes.  Pertinent labs & imaging results that were available during my care of the patient were reviewed by me and considered in my medical decision making (see chart for details).     Treating for viral URI/bronchitis-continue symptomatic and supportive care, vital signs stable, symptoms x4 days, suspect viral etiology, low suspicion of pneumonia at this time.  Treating with albuterol inhaler and prednisone, Claritin and Mucinex and Tessalon for cough and congestion relief.  Checked interactions with Coumadin which appear safe, did discuss with patient possible increase in risk of bleeding with prednisone/steroids with Coumadin.  Patient plans to contact Coumadin lab to ensure safety with this as well as any possible need for sooner INR.  Discussed strict return precautions. Patient verbalized understanding and is agreeable with plan.  Final Clinical Impressions(s) / UC Diagnoses   Final diagnoses:  Encounter for laboratory testing for COVID-19 virus  Viral URI with cough  Wheezing     Discharge Instructions     Please use albuterol inhaler 1 to 2 puffs every 4-6 hours as needed for shortness of breath chest tightness and wheezing Prednisone 40 mg daily x5 days-take with food and earlier in the day if possible Daily Claritin to further help with congestion sneezing and drainage Mucinex DM for congestion and cough as needed Tessalon every 8 hours for cough as needed Rest and fluids  Follow-up if not improving or worsening    ED Prescriptions    Medication Sig Dispense Auth. Provider   predniSONE (DELTASONE) 20 MG  tablet Take 2 tablets (40 mg total) by mouth daily with breakfast for 5 days. 10 tablet Joesph Marcy C, PA-C   loratadine (CLARITIN) 10 MG tablet Take 1 tablet (10 mg total) by mouth daily. 15 tablet Amiyah Shryock C, PA-C   albuterol (VENTOLIN HFA) 108 (90 Base) MCG/ACT inhaler Inhale 1-2 puffs into the lungs every 6 (six) hours as needed for wheezing or shortness of breath. 8 g Leila Schuff C, PA-C   dextromethorphan-guaiFENesin (MUCINEX DM) 30-600 MG 12hr tablet Take 1 tablet by mouth 2 (two) times daily. 20 tablet Jerone Cudmore C, PA-C   benzonatate (TESSALON) 200 MG capsule Take 1 capsule (200 mg total) by mouth 3 (three) times daily as needed for up to 7 days for cough. 28 capsule Arynn Armand, [place] C, PA-C     PDMP not reviewed this encounter.   Lew Dawes, [place] 10/14/20 1241

## 2020-10-15 LAB — NOVEL CORONAVIRUS, NAA: SARS-CoV-2, NAA: NOT DETECTED

## 2020-10-15 LAB — SARS-COV-2, NAA 2 DAY TAT

## 2020-10-16 ENCOUNTER — Ambulatory Visit
Admission: EM | Admit: 2020-10-16 | Discharge: 2020-10-16 | Disposition: A | Discharge status: Home / Self Care | Payer: Medicare Other | Attending: Family Medicine | Admitting: Family Medicine

## 2020-10-16 ENCOUNTER — Ambulatory Visit (INDEPENDENT_AMBULATORY_CARE_PROVIDER_SITE_OTHER): Payer: Medicare Other

## 2020-10-16 DIAGNOSIS — R0602 Shortness of breath: Secondary | ICD-10-CM

## 2020-10-16 DIAGNOSIS — R062 Wheezing: Secondary | ICD-10-CM | POA: Diagnosis not present

## 2020-10-16 DIAGNOSIS — J209 Acute bronchitis, unspecified: Secondary | ICD-10-CM

## 2020-10-16 MED ORDER — DOXYCYCLINE HYCLATE 100 MG PO CAPS: 100.0000 mg | ORAL_CAPSULE | Freq: Two times a day (BID) | ORAL | 0 refills | Status: AC

## 2020-10-16 NOTE — ED Triage Notes (Signed)
Pt presents with complaints of continued wheezing. Reports she was seen here on 3/5 and her covid test was negative. Pt concerned due to history of pneumonia.

## 2020-10-16 NOTE — Discharge Instructions (Addendum)
Resume rescue inhaler 2 puffs every 4-6 hours as needed for SOB or wheezing. Resume Advair 2 puffs twice daily  Doxycycline 100 mg twice daily 7 days. Recheck Coumadin after completion of antibiotic Follow-up and schedule an appointment with Pulmonologist.

## 2020-10-16 NOTE — ED Provider Notes (Signed)
[facility]    CSN: [id number] Arrival date & time: 10/16/20  1210      History   Chief Complaint Chief Complaint  Patient presents with  . Wheezing    HPI Tammy Robbins is a 75 y.o. female.   HPI Patient presents today for evaluation of persistent wheezing and coughing.  Patient was seen here at urgent care on 10/14/2020 treated for viral bronchitis and started on prednisone and prescribed medication for cough as well as albuterol for wheezing.  She reports minimal relief with the prednisone.  She recently started the albuterol.  In review of EMR patient had been prescribed Advair which she has not taken in quite some time.  She also follows pulmonology for management of asthma and has not had a recent flare such as her current symptoms.  She has chronic shortness of breath due to heart disease.  She endorses worsening of shortness of breath during the night last night but that has since improved.  She checks her oxygen level at home and denies any readings below 90.  She denies fever.  Recent Covid test was negative. Past Medical History:  Diagnosis Date  . Anxiety   . Chest tightness   . Chronic atrial fibrillation (HCC)   . Diastolic dysfunction   . HTN (hypertension)   . Hyperlipemia   . Mild aortic stenosis   . Mild tricuspid regurgitation     Patient Active Problem List   Diagnosis Date Noted  . Mild intermittent asthma without complication 04/12/2019  . Restrictive lung disease 04/12/2019  . Healthcare maintenance 04/12/2019  . At risk for obstructive sleep apnea 04/12/2019  . GERD (gastroesophageal reflux disease) 10/08/2016  . Gout 10/08/2016  . Hypothyroidism 10/08/2016  . IBS (irritable bowel syndrome) 10/08/2016  . Aortic valve disorder 07/29/2014  . Encounter for therapeutic drug monitoring 09/08/2013  . Chest tightness   . Essential hypertension   . Other and unspecified hyperlipidemia   . Atrial fibrillation (HCC)   . Mild aortic stenosis   .  Diastolic dysfunction   . Mild tricuspid regurgitation   . Anxiety state     Past Surgical History:  Procedure Laterality Date  . ABDOMINAL HYSTERECTOMY    . BUNIONECTOMY    . CARDIAC CATHETERIZATION     NORMAL  . CATARACT EXTRACTION    . CHOLECYSTECTOMY    . EYE SURGERY    . JOINT REPLACEMENT    . PARTIAL HYSTERECTOMY    . TOTAL KNEE ARTHROPLASTY     LEFT    OB History   No obstetric history on file.      Home Medications    Prior to Admission medications   Medication Sig Start Date End Date Taking? Authorizing Provider  doxycycline (VIBRAMYCIN) 100 MG capsule Take 1 capsule (100 mg total) by mouth 2 (two) times daily for 7 days. 10/16/20 10/23/20 Yes Bing Neighbors, FNP  albuterol (VENTOLIN HFA) 108 (90 Base) MCG/ACT inhaler Inhale 1-2 puffs into the lungs every 6 (six) hours as needed for wheezing or shortness of breath. 10/14/20   Wieters, Hallie C, PA-C  allopurinol (ZYLOPRIM) 300 MG tablet Take 300 mg by mouth daily.    [provider]  benzonatate (TESSALON) 200 MG capsule Take 1 capsule (200 mg total) by mouth 3 (three) times daily as needed for up to 7 days for cough. 10/14/20 10/21/20  Wieters, Hallie C, PA-C  carvedilol (COREG) 25 MG tablet Take 1 tablet (25 mg total) by mouth 2 (  two) times daily with a meal. 06/13/20   Nahser, Deloris PingPhilip J, MD  cephALEXin (KEFLEX) 250 MG capsule Take 250 mg by mouth daily.    [provider]  chlordiazePOXIDE (LIBRIUM) 10 MG capsule Take by mouth as needed. 07/13/20   [provider]  clotrimazole (LOTRIMIN) 1 % cream Apply topically. 05/31/20   [provider]  dextromethorphan-guaiFENesin (MUCINEX DM) 30-600 MG 12hr tablet Take 1 tablet by mouth 2 (two) times daily. 10/14/20   Wieters, Hallie C, PA-C  fish oil-omega-3 fatty acids 1000 MG capsule Take by mouth daily.    [provider]  gabapentin (NEURONTIN) 100 MG capsule Take 100 mg by mouth as needed. 05/16/20   [provider]   hydrochlorothiazide (HYDRODIURIL) 25 MG tablet TAKE 1 TABLET BY MOUTH EVERY DAY 06/13/20   Nahser, Deloris PingPhilip J, MD  levothyroxine (SYNTHROID, LEVOTHROID) 75 MCG tablet Take 75 mcg by mouth daily before breakfast.    [provider]  loratadine (CLARITIN) 10 MG tablet Take 1 tablet (10 mg total) by mouth daily. 10/14/20   Wieters, Hallie C, PA-C  meclizine (ANTIVERT) 12.5 MG tablet Take 12.5 mg by mouth 3 (three) times daily as needed for dizziness.    [provider]  Multiple Vitamin (MULTIVITAMIN) tablet Take 1 tablet by mouth daily.    [provider]  nitroGLYCERIN (NITROSTAT) 0.4 MG SL tablet Place 1 tablet (0.4 mg total) under the tongue every 5 (five) minutes as needed. 08/18/14   Nahser, Deloris PingPhilip J, MD  omeprazole (PRILOSEC) 20 MG capsule Take 20 mg by mouth daily.    [provider]  potassium chloride (KLOR-CON) 10 MEQ tablet Take 1 tablet (10 mEq total) by mouth 2 (two) times daily. 06/13/20   Nahser, Deloris PingPhilip J, MD  predniSONE (DELTASONE) 20 MG tablet Take 2 tablets (40 mg total) by mouth daily with breakfast for 5 days. 10/14/20 10/19/20  Wieters, Hallie C, PA-C  simvastatin (ZOCOR) 20 MG tablet Take 20 mg by mouth at bedtime.    [provider]  warfarin (COUMADIN) 5 MG tablet Take 1 tablets daily except 1/2 tablet on Mon, Wed, Fri or AS DIRECTED BY COUMADIN CLINIC 03/27/20   Nahser, Deloris PingPhilip J, MD    Family History Family History  Problem Relation Age of Onset  . Intracerebral hemorrhage Father   . Hypertension Father   . Pneumonia Mother   . Mental retardation Sister   . Lung cancer Brother     Social History Social History   Tobacco Use  . Smoking status: Never Smoker  . Smokeless tobacco: Never Used  Vaping Use  . Vaping Use: Never used  Substance Use Topics  . Alcohol use: Yes    Comment: occasionally  . Drug use: No     Allergies   Diflucan [fluconazole]   Review of Systems Review of Systems Pertinent negatives listed in  HPI  Physical Exam Triage Vital Signs ED Triage Vitals  Enc Vitals Group     BP 10/16/20 1327 109/69     Pulse Rate 10/16/20 1327 86     Resp 10/16/20 1327 18     Temp 10/16/20 1327 98.3 F (36.8 C)     Temp src --      SpO2 10/16/20 1327 95 %     Weight --      Height --      Head Circumference --      Peak Flow --      Pain Score 10/16/20 1326 0  Pain Loc --      Pain Edu? --      Excl. in GC? --    No data found.  Updated Vital Signs BP 109/69   Pulse 86   Temp 98.3 F (36.8 C)   Resp 18   SpO2 95%   Visual Acuity Right Eye Distance:   Left Eye Distance:   Bilateral Distance:    Right Eye Near:   Left Eye Near:    Bilateral Near:     Physical Exam Constitutional:      Appearance: Normal appearance.  HENT:     Head: Normocephalic.     Right Ear: Tympanic membrane normal.     Left Ear: Tympanic membrane normal.     Nose: Congestion present.  Cardiovascular:     Rate and Rhythm: Normal rate and regular rhythm.  Pulmonary:     Effort: Pulmonary effort is normal.     Breath sounds: Wheezing present. No rhonchi or rales.     Comments: Expiratory wheeze noted at the base of the lungs bilaterally Skin:    General: Skin is warm.     Capillary Refill: Capillary refill takes less than 2 seconds.  Neurological:     General: No focal deficit present.     Mental Status: She is alert. Mental status is at baseline.     Gait: Gait normal.      UC Treatments / Results  Labs (all labs ordered are listed, but only abnormal results are displayed) Labs Reviewed - No data to display  EKG   Radiology DG Chest 2 View  Result Date: 10/16/2020 CLINICAL DATA:  Wheezing and shortness of breath EXAM: CHEST - 2 VIEW COMPARISON:  March 2020 FINDINGS: Chronic interstitial prominence. No new consolidation or definite superimposed edema. No pleural effusion or pneumothorax. No acute osseous abnormality. Similar cardiomediastinal contours with mild cardiomegaly.  IMPRESSION: No acute process in the chest. Electronically Signed   By: Guadlupe Spanish M.D.   On: 10/16/2020 14:08    Procedures Procedures (including critical care time)  Medications Ordered in UC Medications - No data to display  Initial Impression / Assessment and Plan / UC Course  I have reviewed the triage vital signs and the nursing notes.  Pertinent labs & imaging results that were available during my care of the patient were reviewed by me and considered in my medical decision making (see chart for details).    Acute bronchospasm secondary to recent diagnosis of bronchitis. Chest x-ray negative for pneumonia, effusions, or cardiomegaly. Advise to resume preventative inhaler Advair 2 puffs twice daily and Albuterol 2 puffs every 4-6 hours as needed. Will cover today with Doxycyline to cover for any lower respiratory infective process. Encouraged patient to schedule a follow-up with primary care to follow-up at least 1-2 weeks following completion of therapy. Patient stable for discharge.  Final Clinical Impressions(s) / UC Diagnoses   Final diagnoses:  Bronchospasm with bronchitis, acute     Discharge Instructions     Resume rescue inhaler 2 puffs every 4-6 hours as needed for SOB or wheezing. Resume Advair 2 puffs twice daily  Doxycycline 100 mg twice daily 7 days. Recheck Coumadin after completion of antibiotic Follow-up and schedule an appointment with Pulmonologist.    ED Prescriptions    Medication Sig Dispense Auth. Provider   doxycycline (VIBRAMYCIN) 100 MG capsule Take 1 capsule (100 mg total) by mouth 2 (two) times daily for 7 days. 14 capsule Bing Neighbors, FNP  PDMP not reviewed this encounter.   Bing Neighbors, [place] 10/20/20 2336

## 2020-10-17 ENCOUNTER — Ambulatory Visit (INDEPENDENT_AMBULATORY_CARE_PROVIDER_SITE_OTHER): Payer: Medicare Other | Admitting: *Deleted

## 2020-10-17 ENCOUNTER — Other Ambulatory Visit: Payer: Self-pay

## 2020-10-17 DIAGNOSIS — Z7901 Long term (current) use of anticoagulants: Secondary | ICD-10-CM | POA: Diagnosis not present

## 2020-10-17 DIAGNOSIS — Z5181 Encounter for therapeutic drug level monitoring: Secondary | ICD-10-CM | POA: Diagnosis not present

## 2020-10-17 DIAGNOSIS — I4891 Unspecified atrial fibrillation: Secondary | ICD-10-CM | POA: Diagnosis not present

## 2020-10-17 LAB — POCT INR: INR: 3.1 — AB (ref 2.0–3.0)

## 2020-10-17 NOTE — Patient Instructions (Addendum)
Description   Today take 1/2 tablet then continue taking 1 tablet daily except 1/2 tablet on Mondays, Wednesdays, and Fridays. Recheck on Friday on Doxy 100mg  BID x 7days (usually 6 weeks).  Call if started on new meds or antibiotics [phone].

## 2020-10-20 ENCOUNTER — Ambulatory Visit (INDEPENDENT_AMBULATORY_CARE_PROVIDER_SITE_OTHER): Payer: Medicare Other | Admitting: *Deleted

## 2020-10-20 ENCOUNTER — Other Ambulatory Visit: Payer: Self-pay

## 2020-10-20 DIAGNOSIS — I4891 Unspecified atrial fibrillation: Secondary | ICD-10-CM

## 2020-10-20 DIAGNOSIS — Z5181 Encounter for therapeutic drug level monitoring: Secondary | ICD-10-CM | POA: Diagnosis not present

## 2020-10-20 DIAGNOSIS — Z7901 Long term (current) use of anticoagulants: Secondary | ICD-10-CM

## 2020-10-20 LAB — POCT INR: INR: 2.7 (ref 2.0–3.0)

## 2020-10-20 NOTE — Patient Instructions (Signed)
Description   Continue to take warfarin 1 tablet daily except for 1/2 a tablet on Monday, Wednesday and Friday. Recheck INR in 6 weeks.  Call if started on new meds or antibiotics [phone].

## 2020-10-24 ENCOUNTER — Encounter: Payer: Self-pay | Admitting: Pulmonary Disease

## 2020-10-24 ENCOUNTER — Other Ambulatory Visit: Payer: Self-pay

## 2020-10-24 ENCOUNTER — Ambulatory Visit (INDEPENDENT_AMBULATORY_CARE_PROVIDER_SITE_OTHER): Payer: Medicare Other | Admitting: Pulmonary Disease

## 2020-10-24 VITALS — BP 126/70 | HR 77 | Temp 97.4°F | Ht 64.0 in | Wt 279.6 lb

## 2020-10-24 DIAGNOSIS — J4530 Mild persistent asthma, uncomplicated: Secondary | ICD-10-CM | POA: Diagnosis not present

## 2020-10-24 MED ORDER — FLUTICASONE-SALMETEROL 115-21 MCG/ACT IN AERO: 2.0000 | INHALATION_SPRAY | Freq: Two times a day (BID) | RESPIRATORY_TRACT | 5 refills | Status: DC

## 2020-10-24 NOTE — Progress Notes (Signed)
Tammy Robbins    [id number]    Jul 17, 1946  Primary Care Physician:Roberts, Windy Fast, MD  Referring Physician: Burton Apley, MD [address] [place],  [place] [address]  Chief complaint: Follow-up for mild asthma  HPI: 75 year old never smoker with history of tension, A. fib, aortic stenosis, GERD, hypothyroidism, diastolic dysfunction.  Presenting with cough, wheezing, dyspnea for the past 1 month.  Symptoms started after upper respiratory tract infection worsened during Thanksgiving break.  Seen at primary care and given prednisone taper.  She is also on Keflex for UTI.  She saw primary care yesterday with a chest x-ray which showed no acute lung infiltrate.  Keflex dose was increased.  She has no previous history of lung issues and follows with Dr. Melburn Popper, cardiology.  She had cardiac catheterization in the past with no evidence of coronary artery disease. Diagnosed with severe sleep apnea by cardiology and started on CPAP.  She is tolerating therapy well.  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  Interim history: Taken off advair in July 2021 since she was doing well. She had a recent exacerbation during her trip to [facility] after exposure to smoke. Has to urgent care visits in early March 2022 requiring prednisone and antibiotics She has been restarted on Advair and reports improving symptoms  Outpatient Encounter Medications as of 10/24/2020  Medication Sig  . albuterol (VENTOLIN HFA) 108 (90 Base) MCG/ACT inhaler Inhale 1-2 puffs into the lungs every 6 (six) hours as needed for wheezing or shortness of breath.  . allopurinol (ZYLOPRIM) 300 MG tablet Take 300 mg by mouth daily.  . carvedilol (COREG) 25 MG tablet  Take 1 tablet (25 mg total) by mouth 2 (two) times daily with a meal.  . cephALEXin (KEFLEX) 250 MG capsule Take 250 mg by mouth daily.  . chlordiazePOXIDE (LIBRIUM) 10 MG capsule Take by mouth as needed.  . clotrimazole (LOTRIMIN) 1 % cream Apply topically.  Marland Kitchen dextromethorphan-guaiFENesin (MUCINEX DM) 30-600 MG 12hr tablet Take 1 tablet by mouth 2 (two) times daily.  . fish oil-omega-3 fatty acids 1000 MG capsule Take by mouth daily.  Marland Kitchen gabapentin (NEURONTIN) 100 MG capsule Take 100 mg by mouth as needed.  . hydrochlorothiazide (HYDRODIURIL) 25 MG tablet TAKE 1 TABLET BY MOUTH EVERY DAY  . levothyroxine (SYNTHROID, LEVOTHROID) 75 MCG tablet Take 75 mcg by mouth daily before breakfast.  . loratadine (CLARITIN) 10 MG tablet Take 1 tablet (10 mg total) by mouth daily.  . meclizine (ANTIVERT) 12.5 MG tablet Take 12.5 mg by mouth 3 (three) times daily as needed for dizziness.  . Multiple Vitamin (MULTIVITAMIN) tablet Take 1 tablet by mouth daily.  . nitroGLYCERIN (NITROSTAT) 0.4 MG SL tablet Place 1 tablet (0.4 mg total) under the tongue every 5 (five) minutes as needed.  Marland Kitchen omeprazole (PRILOSEC) 20 MG capsule Take 20 mg by mouth daily.  . potassium chloride (KLOR-CON) 10 MEQ tablet Take 1 tablet (10 mEq total) by mouth 2 (two) times daily.  . simvastatin (ZOCOR) 20 MG tablet Take 20 mg by mouth at bedtime.  Marland Kitchen warfarin (COUMADIN) 5 MG tablet Take 1 tablets daily except 1/2 tablet on Mon, Wed, Fri or AS DIRECTED BY COUMADIN CLINIC   No facility-administered encounter medications on file as  of 10/24/2020.   Physical Exam: Blood pressure 126/70, pulse 77, temperature (!) 97.4 F (36.3 C), temperature source Temporal, height 5\' 4"  (1.626 m), weight 279 lb 9.6 oz (126.8 kg), SpO2 99 %. Gen:      No acute distress HEENT:  EOMI, sclera anicteric Neck:     No masses; no thyromegaly Lungs:    Clear to auscultation bilaterally; normal respiratory effort CV:         Regular rate and rhythm; no  murmurs Abd:      + bowel sounds; soft, non-tender; no palpable masses, no distension Ext:    No edema; adequate peripheral perfusion Skin:      Warm and dry; no rash Neuro: alert and oriented x 3 Psych: normal mood and affect  Data Reviewed: Imaging: CT abdomen pelvis 02/08/15- visualized lung bases are clear. Chest x-ray 07/14/2018- clear lungs with no acute cardiopulmonary abnormality. Chest x-ray 09/21/2018- right perihilar infiltrate. Chest x-ray 10/20/2018- resolution of lung infiltrate.Tiny right pleural effusion versus pleural thickening, unchanged. Chest x-ray 10/16/2020-no acute infiltrate, lungs are clear. I reviewed the images personally.  PFTs: 04/12/2019-FVC 1.99 [68%], FEV1 1.58 [72%], F/F 80, TLC 4.10 [81%], DLCO 17.79 [72%] No overt obstruction but small airways disease present as suggested by curvature to flow loop, air trapping and improvement in mid flow following bronchodilator.  FENO 07/15/2018-7 FENO 09/21/2018-26  ACT score 06/03/2019-21  Labs CBC 09/21/2018-WBC 3.3, eos 4.3%, absolute eosinophil count 142 IgE 09/21/2018-38  Sleep: PSG 06/02/19- Severe OSA AHI 60, severe desaturation to 70% CPAP titration-recommended CPAP 12 cm.  Assessment:  Mild asthma Taken off advair last year Returns with exacerbation requiring prednisone and steroids  She is back back on controller medication, continue albuterol as needed  GERD, postnasal drip Continue prilosec  OSA Started on CPAP for severe sleep apnea.  Follows with Dr. 06/04/19, cardiology  Encouraged weight loss  Health maintenance 05/27/2019-influenza  She has COVID-19 vaccine hesitancy.  Discussed in detail with patient and strongly encouraged her to get vaccinated.  Plan/Recommendations: -Continue Advair, albuterol  Follow-up in 6 months  05/29/2019 MD [facility] 10/24/2020, 11:57 AM  CC: 10/26/2020, MD

## 2020-10-24 NOTE — Patient Instructions (Signed)
We will resume the Advair since you are having an exacerbation Continue albuterol as needed Follow-up in 6 months.

## 2020-10-26 ENCOUNTER — Other Ambulatory Visit: Payer: Self-pay

## 2020-10-26 ENCOUNTER — Ambulatory Visit: Payer: Medicare Other

## 2020-10-26 ENCOUNTER — Ambulatory Visit
Admission: RE | Admit: 2020-10-26 | Discharge: 2020-10-26 | Disposition: A | Discharge status: Home / Self Care | Payer: Medicare Other | Source: Ambulatory Visit | Attending: Internal Medicine | Admitting: Internal Medicine

## 2020-10-26 DIAGNOSIS — Z1231 Encounter for screening mammogram for malignant neoplasm of breast: Secondary | ICD-10-CM

## 2020-10-27 ENCOUNTER — Ambulatory Visit: Payer: Medicare Other

## 2020-11-06 ENCOUNTER — Other Ambulatory Visit: Payer: Self-pay

## 2020-11-06 MED ORDER — WARFARIN SODIUM 5 MG PO TABS: ORAL_TABLET | ORAL | 0 refills | Status: DC

## 2020-11-06 NOTE — Telephone Encounter (Signed)
Prescription refill request received for warfarin Lov: Turner, 09/27/2020 Next INR check: 4/18 Warfarin tablet strength: 5mg 

## 2020-11-27 ENCOUNTER — Other Ambulatory Visit: Payer: Self-pay

## 2020-11-27 ENCOUNTER — Ambulatory Visit (INDEPENDENT_AMBULATORY_CARE_PROVIDER_SITE_OTHER): Payer: Medicare Other

## 2020-11-27 DIAGNOSIS — Z5181 Encounter for therapeutic drug level monitoring: Secondary | ICD-10-CM

## 2020-11-27 DIAGNOSIS — I4891 Unspecified atrial fibrillation: Secondary | ICD-10-CM | POA: Diagnosis not present

## 2020-11-27 DIAGNOSIS — Z7901 Long term (current) use of anticoagulants: Secondary | ICD-10-CM | POA: Diagnosis not present

## 2020-11-27 LAB — POCT INR: INR: 2.2 (ref 2.0–3.0)

## 2020-11-27 NOTE — Patient Instructions (Signed)
Continue to take warfarin 1 tablet daily except for 1/2 a tablet on Monday, Wednesday and Friday. Recheck INR in 6 weeks.  Call if started on new meds or antibiotics [phone].

## 2021-01-09 ENCOUNTER — Ambulatory Visit (INDEPENDENT_AMBULATORY_CARE_PROVIDER_SITE_OTHER): Payer: Medicare Other

## 2021-01-09 ENCOUNTER — Other Ambulatory Visit: Payer: Self-pay

## 2021-01-09 DIAGNOSIS — I4891 Unspecified atrial fibrillation: Secondary | ICD-10-CM

## 2021-01-09 DIAGNOSIS — Z7901 Long term (current) use of anticoagulants: Secondary | ICD-10-CM

## 2021-01-09 DIAGNOSIS — Z5181 Encounter for therapeutic drug level monitoring: Secondary | ICD-10-CM | POA: Diagnosis not present

## 2021-01-09 LAB — POCT INR: INR: 2.1 (ref 2.0–3.0)

## 2021-01-09 NOTE — Patient Instructions (Signed)
Continue to take warfarin 1 tablet daily except for 1/2 a tablet on Monday, Wednesday and Friday. Recheck INR in 6 weeks.  Call if started on new meds or antibiotics [phone].

## 2021-01-17 ENCOUNTER — Other Ambulatory Visit: Payer: Self-pay | Admitting: Cardiovascular Disease

## 2021-02-19 ENCOUNTER — Other Ambulatory Visit: Payer: Self-pay

## 2021-02-19 ENCOUNTER — Ambulatory Visit (INDEPENDENT_AMBULATORY_CARE_PROVIDER_SITE_OTHER): Payer: Medicare Other | Admitting: *Deleted

## 2021-02-19 DIAGNOSIS — I4891 Unspecified atrial fibrillation: Secondary | ICD-10-CM

## 2021-02-19 DIAGNOSIS — Z7901 Long term (current) use of anticoagulants: Secondary | ICD-10-CM

## 2021-02-19 DIAGNOSIS — Z5181 Encounter for therapeutic drug level monitoring: Secondary | ICD-10-CM

## 2021-02-19 LAB — POCT INR: INR: 2.2 (ref 2.0–3.0)

## 2021-02-19 NOTE — Patient Instructions (Signed)
Description   Continue taking warfarin 1 tablet daily except for 1/2 tablet on Monday, Wednesday and Friday. Recheck INR in 6 weeks.  Call if started on new meds or antibiotics [phone].

## 2021-03-29 ENCOUNTER — Encounter: Payer: Self-pay | Admitting: Cardiovascular Disease

## 2021-03-29 NOTE — Progress Notes (Signed)
Tammy Robbins Date of Birth  [birth year] [facility]     [place] Office  [address]    [address]   [address] [employer] [place], [place]  [address]    [place], [place]  [address] [phone]  Fax  [phone]  [phone]  Fax [phone]  Problems: 1. Atrial fibrillation 3. Hypertension 3. Hyperlipidemia 4. History chest pain-smooth and normal coronary arteries by cardiac catheterization   5. Hypothyroidism 6. Arthritis 7. Aortic stenosis - mild  8. obesity    [name] is a 75 year old female with a history of atrial fibrillation. She's been on chronic Coumadin therapy. She's had a cardiac catheterization in the past which revealed smooth and normal coronary arteries. She also has a history of hypertension and hyperlipidemia. She has mild aortic stenosis. She's done well from a cardiac standpoint. She has not had any episodes of chest pain. She has had an upper respiratory tract infection since Thanksgiving.  September 10, 2102: Tammy Robbins has a history of atrial fibrillation. She's been on chronic Coumadin therapy. Her INR levels here.  She's been having some problems with arthritis recently. She denies any cardiac problems. She denies any chest pain or shortness breath or palpitations. She remains active but is not getting any regular exercise.  March 10, 2013:  Tammy Robbins is doing well. She is doing low impact exercises.    No CP or dyspnea.    Jan. 28, 2015:  Tammy Robbins is doing well.  Stays active.  Exercises regularly. No symptoms.   iNR levels are ok.  Did not take BP meds this am.  Cholesterol is managed by Dr. Su Hilt.    Jan. 7, 2016:  [name] is a 75 year old female with a history of atrial fibrillation. She's been on chronic Coumadin therapy She has episodes of chest pain on a very rare basis.  Had a cath which showed normal coronaries.   Her NTG has expired. Has recurrent UTIs, seeing a urologist.  Jan. 18, 2017:  Doing well. Lots of fatigue. INR was 3.9  today .  Not getting much exercise.     October 24, 2016:  Tammy Robbins is seen today  For follow up of her atrial fib .   CHADS2VASC is  67   ( female, age 35 , HTN)  Has chronic AF.   Is on coumadin .   No CP or limitations   November 25, 2017:  Tammy Robbins is seen today for follow-up of her atrial fibrillation. Husband passed away last Feb 14, 2023 .  She has had Mohs surgery for a face cancer  Is on coumadin  - INR levels look good   March 20, 2020: Tammy Robbins is seen today for follow up .  She was last  seen in April, 2019.  She has a history of atrial fibrillation and is on Coumadin.  She has a history of mild aortic stenosis, history of chest pain with normal coronary arteries by heart catheterization, hypertension, hyperlipidemia..  She has OSA and is followed by Dr. Mayford Knife for her OSA Wt is 284 lbs.  INR  levels revewed and are theraputic  BP is a little elevated.   She has not taken her meds.  Has not lost any weight .. Dr Su Hilt is managing her lipids but she has not seen him in a while  Aug. 19, 2022: Tammy Robbins is seen today for follow up of her atrial fib Has OSA, mild AS Wt is 282 lbs. Is not limiting her carbs .  Going  to TOPS ( Take off weight sensibly ) We discussed limiting her carbs .    Current Outpatient Medications on File Prior to Visit  Medication Sig Dispense Refill   allopurinol (ZYLOPRIM) 300 MG tablet Take 300 mg by mouth daily.     carvedilol (COREG) 25 MG tablet Take 1 tablet (25 mg total) by mouth 2 (two) times daily with a meal. 180 tablet 2   cephALEXin (KEFLEX) 250 MG capsule Take 250 mg by mouth daily.     chlordiazePOXIDE (LIBRIUM) 10 MG capsule Take by mouth as needed.     clotrimazole (LOTRIMIN) 1 % cream Apply topically.     fish oil-omega-3 fatty acids 1000 MG capsule Take by mouth daily.     fluticasone-salmeterol (ADVAIR HFA) 115-21 MCG/ACT inhaler Inhale 2 puffs into the lungs 2 (two) times daily. 8 g 5   gabapentin (NEURONTIN) 100 MG capsule Take 100 mg by mouth as  needed.     hydrochlorothiazide (HYDRODIURIL) 25 MG tablet TAKE 1 TABLET BY MOUTH EVERY DAY 90 tablet 2   levothyroxine (SYNTHROID, LEVOTHROID) 75 MCG tablet Take 75 mcg by mouth daily before breakfast.     meclizine (ANTIVERT) 12.5 MG tablet Take 12.5 mg by mouth 3 (three) times daily as needed for dizziness.     Multiple Vitamin (MULTIVITAMIN) tablet Take 1 tablet by mouth daily.     nitroGLYCERIN (NITROSTAT) 0.4 MG SL tablet Place 1 tablet (0.4 mg total) under the tongue every 5 (five) minutes as needed. 25 tablet 6   omeprazole (PRILOSEC) 20 MG capsule Take 20 mg by mouth daily.     potassium chloride (KLOR-CON) 10 MEQ tablet Take 1 tablet (10 mEq total) by mouth 2 (two) times daily. 180 tablet 2   simvastatin (ZOCOR) 20 MG tablet Take 20 mg by mouth at bedtime.     warfarin (COUMADIN) 5 MG tablet TAKE 1 TABLET BY MOUTH DAILY. EXCEPT 1/2 TABLET ON MONDAY, WEDNESDAY, FRIDAY OR AS DIRECTED BY COUMADIN CLINIC 90 tablet 1   albuterol (VENTOLIN HFA) 108 (90 Base) MCG/ACT inhaler Inhale 1-2 puffs into the lungs every 6 (six) hours as needed for wheezing or shortness of breath. (Patient not taking: Reported on 03/30/2021) 8 g 0   dextromethorphan-guaiFENesin (MUCINEX DM) 30-600 MG 12hr tablet Take 1 tablet by mouth 2 (two) times daily. (Patient not taking: Reported on 03/30/2021) 20 tablet 0   loratadine (CLARITIN) 10 MG tablet Take 1 tablet (10 mg total) by mouth daily. (Patient not taking: Reported on 03/30/2021) 15 tablet 0   No current facility-administered medications on file prior to visit.    Allergies  Allergen Reactions   Diflucan [Fluconazole] Rash    Past Medical History:  Diagnosis Date   Anxiety    Chest tightness    Chronic atrial fibrillation (HCC)    Diastolic dysfunction    HTN (hypertension)    Hyperlipemia    Mild aortic stenosis    Mild tricuspid regurgitation     Past Surgical History:  Procedure Laterality Date   ABDOMINAL HYSTERECTOMY     BUNIONECTOMY      CARDIAC CATHETERIZATION     NORMAL   CATARACT EXTRACTION     CHOLECYSTECTOMY     EYE SURGERY     JOINT REPLACEMENT     PARTIAL HYSTERECTOMY     TOTAL KNEE ARTHROPLASTY     LEFT    Social History   Tobacco Use  Smoking Status Never  Smokeless Tobacco Never    Social History  Substance and Sexual Activity  Alcohol Use Yes   Comment: occasionally    Family History  Problem Relation Age of Onset   Intracerebral hemorrhage Father    Hypertension Father    Pneumonia Mother    Mental retardation Sister    Lung cancer Brother     Reviw of Systems:   Noted in current history, otherwise systems are negative.  Physical Exam: Blood pressure 124/78, pulse 77, height 5\' 4"  (1.626 m), weight 282 lb 3.2 oz (128 kg), SpO2 98 %.  GEN:  morbidly obese , elderly female HEENT: Normal NECK: No JVD; No carotid bruits LYMPHATICS: No lymphadenopathy CARDIAC: RRR , irreg. Irreg  RESPIRATORY:  Clear to auscultation without rales, wheezing or rhonchi  ABDOMEN: Soft, non-tender, non-distended MUSCULOSKELETAL:  No edema; No deformity  SKIN: Warm and dry NEUROLOGIC:  Alert and oriented x 3   ECG: March 30, 2021: Atrial fibrillation with a ventricular rate of 77.  Low voltage.  No significant ST or T wave changes.   Assessment / Plan:   1. Atrial fibrillation-    Tammy Robbins is in persistent atrial fibrillation.  She seems to be doing well.  She cannot feel that her heart is beating irregularly.  Continue warfarin.  This is being managed by the Coumadin clinic.  She has a Coumadin clinic appointment today.   3. Hypertension -   blood pressure is well controlled.  Continue see medications.  Her last potassium level was 3.9.  We will be rechecking a basic metabolic profile today.   3. Hyperlipidemia -lipids looks great last year.  We will recheck lipids, ALT, basic metabolic profile today.   4. History chest pain-   5. Hypothyroidism  6. Arthritis-  7. Aortic stenosis - mild  8.  Obesity - Encouraged carb restriction.         Winters Lions, MD  03/30/2021 8:48 AM    [facility] [address] [place],  [address] [place], [place]  [place] Pager [phone] Phone: [phone]; Fax: [phone]

## 2021-03-30 ENCOUNTER — Ambulatory Visit (INDEPENDENT_AMBULATORY_CARE_PROVIDER_SITE_OTHER): Payer: Medicare Other | Admitting: *Deleted

## 2021-03-30 ENCOUNTER — Encounter: Payer: Self-pay | Admitting: Cardiovascular Disease

## 2021-03-30 ENCOUNTER — Other Ambulatory Visit: Payer: Self-pay

## 2021-03-30 ENCOUNTER — Ambulatory Visit (INDEPENDENT_AMBULATORY_CARE_PROVIDER_SITE_OTHER): Payer: Medicare Other | Admitting: Cardiovascular Disease

## 2021-03-30 VITALS — BP 124/78 | HR 77 | Ht 64.0 in | Wt 282.2 lb

## 2021-03-30 DIAGNOSIS — E785 Hyperlipidemia, unspecified: Secondary | ICD-10-CM | POA: Diagnosis not present

## 2021-03-30 DIAGNOSIS — I4891 Unspecified atrial fibrillation: Secondary | ICD-10-CM

## 2021-03-30 DIAGNOSIS — Z7901 Long term (current) use of anticoagulants: Secondary | ICD-10-CM | POA: Diagnosis not present

## 2021-03-30 DIAGNOSIS — Z5181 Encounter for therapeutic drug level monitoring: Secondary | ICD-10-CM

## 2021-03-30 DIAGNOSIS — I10 Essential (primary) hypertension: Secondary | ICD-10-CM | POA: Diagnosis not present

## 2021-03-30 LAB — ALT: ALT: 14 IU/L (ref 0–32)

## 2021-03-30 LAB — BASIC METABOLIC PANEL
BUN/Creatinine Ratio: 24 (ref 12–28)
BUN: 20 mg/dL (ref 8–27)
CO2: 24 mmol/L (ref 20–29)
Calcium: 9.1 mg/dL (ref 8.7–10.3)
Chloride: 102 mmol/L (ref 96–106)
Creatinine, Ser: 0.83 mg/dL (ref 0.57–1.00)
Glucose: 91 mg/dL (ref 65–99)
Potassium: 4 mmol/L (ref 3.5–5.2)
Sodium: 142 mmol/L (ref 134–144)
eGFR: 74 mL/min/{1.73_m2} (ref >59)

## 2021-03-30 LAB — LIPID PANEL
Chol/HDL Ratio: 2.5 ratio (ref 0.0–4.4)
Cholesterol, Total: 121 mg/dL (ref 100–199)
HDL: 49 mg/dL (ref >39)
LDL Chol Calc (NIH): 55 mg/dL (ref 0–99)
Triglycerides: 90 mg/dL (ref 0–149)
VLDL Cholesterol Cal: 17 mg/dL (ref 5–40)

## 2021-03-30 LAB — POCT INR: INR: 2.3 (ref 2.0–3.0)

## 2021-03-30 NOTE — Patient Instructions (Signed)
Description   Continue taking warfarin 1 tablet daily except for 1/2 tablet on Monday, Wednesday and Friday. Recheck INR in 6 weeks.  Call if started on new meds or antibiotics #[phone].      

## 2021-03-30 NOTE — Patient Instructions (Signed)
Medication Instructions:  Your physician recommends that you continue on your current medications as directed. Please refer to the Current Medication list given to you today.  *If you need a refill on your cardiac medications before your next appointment, please call your pharmacy*   Lab Work: BMET, Lipids and ALT today  If you have labs (blood work) drawn today and your tests are completely normal, you will receive your results only by: MyChart Message (if you have MyChart) OR A paper copy in the mail If you have any lab test that is abnormal or we need to change your treatment, we will call you to review the results.   Testing/Procedures: None   Follow-Up: At [facility], you and your health needs are our priority.  As part of our continuing mission to provide you with exceptional heart care, we have created designated Provider Care Teams.  These Care Teams include your primary Cardiologist (physician) and Advanced Practice Providers (APPs -  Physician Assistants and Nurse Practitioners) who all work together to provide you with the care you need, when you need it.  We recommend signing up for the patient portal called "MyChart".  Sign up information is provided on this After Visit Summary.  MyChart is used to connect with patients for Virtual Visits (Telemedicine).  Patients are able to view lab/test results, encounter notes, upcoming appointments, etc.  Non-urgent messages can be sent to your provider as well.   To learn more about what you can do with MyChart, go to [email or web address].    Your next appointment:   1 year(s)  The format for your next appointment:   In Person  Provider:   You may see Kristeen Miss, MD or one of the following Advanced Practice Providers on your designated Care Team:   Tereso Newcomer, PA-C Chelsea Aus, [place]   Other Instructions

## 2021-04-02 ENCOUNTER — Other Ambulatory Visit: Payer: Self-pay | Admitting: *Deleted

## 2021-04-02 MED ORDER — POTASSIUM CHLORIDE ER 10 MEQ PO TBCR: 10.0000 meq | EXTENDED_RELEASE_TABLET | Freq: Two times a day (BID) | ORAL | 3 refills | Status: DC

## 2021-04-02 MED ORDER — CARVEDILOL 25 MG PO TABS: 25.0000 mg | ORAL_TABLET | Freq: Two times a day (BID) | ORAL | 3 refills | Status: DC

## 2021-04-02 MED ORDER — HYDROCHLOROTHIAZIDE 25 MG PO TABS: 25.0000 mg | ORAL_TABLET | Freq: Every day | ORAL | 3 refills | Status: DC

## 2021-05-09 ENCOUNTER — Other Ambulatory Visit: Payer: Self-pay

## 2021-05-09 ENCOUNTER — Ambulatory Visit (INDEPENDENT_AMBULATORY_CARE_PROVIDER_SITE_OTHER): Payer: Medicare Other

## 2021-05-09 DIAGNOSIS — Z5181 Encounter for therapeutic drug level monitoring: Secondary | ICD-10-CM

## 2021-05-09 LAB — POCT INR: INR: 2.2 (ref 2.0–3.0)

## 2021-05-09 NOTE — Patient Instructions (Signed)
Description   Continue taking warfarin 1 tablet daily except for 1/2 tablet on Monday, Wednesday and Friday. Recheck INR in 6 weeks.  Call if started on new meds or antibiotics #[phone].      

## 2021-05-23 ENCOUNTER — Other Ambulatory Visit: Payer: Self-pay | Admitting: Pulmonary Disease

## 2021-06-06 ENCOUNTER — Ambulatory Visit (INDEPENDENT_AMBULATORY_CARE_PROVIDER_SITE_OTHER): Payer: Medicare Other

## 2021-06-06 ENCOUNTER — Other Ambulatory Visit: Payer: Self-pay

## 2021-06-06 DIAGNOSIS — Z5181 Encounter for therapeutic drug level monitoring: Secondary | ICD-10-CM | POA: Diagnosis not present

## 2021-06-06 LAB — POCT INR: INR: 2.9 (ref 2.0–3.0)

## 2021-06-06 NOTE — Patient Instructions (Signed)
Description   Continue taking warfarin 1 tablet daily except for 1/2 tablet on Monday, Wednesday and Friday. Recheck INR in 6 weeks.  Call if started on new meds or antibiotics #[phone].      

## 2021-06-12 ENCOUNTER — Other Ambulatory Visit: Payer: Self-pay

## 2021-06-12 ENCOUNTER — Ambulatory Visit (INDEPENDENT_AMBULATORY_CARE_PROVIDER_SITE_OTHER): Payer: Medicare Other | Admitting: *Deleted

## 2021-06-12 DIAGNOSIS — Z5181 Encounter for therapeutic drug level monitoring: Secondary | ICD-10-CM | POA: Diagnosis not present

## 2021-06-12 DIAGNOSIS — I4891 Unspecified atrial fibrillation: Secondary | ICD-10-CM | POA: Diagnosis not present

## 2021-06-12 LAB — POCT INR: INR: 3.9 — AB (ref 2.0–3.0)

## 2021-06-12 NOTE — Patient Instructions (Signed)
Description   Do not take any Warfarin today then continue taking warfarin 1 tablet daily except for 1/2 tablet on Monday, Wednesday and Friday. Recheck INR in 5 weeks.  Call if started on new meds or antibiotics [phone].

## 2021-07-16 ENCOUNTER — Other Ambulatory Visit: Payer: Self-pay

## 2021-07-16 ENCOUNTER — Ambulatory Visit (INDEPENDENT_AMBULATORY_CARE_PROVIDER_SITE_OTHER): Payer: Medicare Other

## 2021-07-16 ENCOUNTER — Other Ambulatory Visit: Payer: Self-pay | Admitting: Cardiovascular Disease

## 2021-07-16 DIAGNOSIS — Z5181 Encounter for therapeutic drug level monitoring: Secondary | ICD-10-CM

## 2021-07-16 DIAGNOSIS — I4891 Unspecified atrial fibrillation: Secondary | ICD-10-CM | POA: Diagnosis not present

## 2021-07-16 LAB — POCT INR: INR: 2.5 (ref 2.0–3.0)

## 2021-07-16 NOTE — Patient Instructions (Signed)
-    continue taking warfarin 1 tablet daily except for 1/2 tablet on Monday, Wednesday and Friday.  - Recheck INR in 6 weeks.   Call if started on new meds or antibiotics [phone].

## 2021-08-23 ENCOUNTER — Telehealth: Payer: Self-pay | Admitting: Cardiology

## 2021-08-23 NOTE — Telephone Encounter (Signed)
Reached out to patient and gave the number to [facility] Victorino Dike) for help with troubleshooting her mask problem.

## 2021-08-23 NOTE — Telephone Encounter (Signed)
What problem are you experiencing? Mask is leaking, pt unsure if she is wearing it correctly  Who is your medical equipment company? [employer]   Please route to the sleep study assistant.

## 2021-08-27 ENCOUNTER — Ambulatory Visit (INDEPENDENT_AMBULATORY_CARE_PROVIDER_SITE_OTHER): Payer: Medicare Other

## 2021-08-27 ENCOUNTER — Other Ambulatory Visit: Payer: Self-pay

## 2021-08-27 DIAGNOSIS — Z5181 Encounter for therapeutic drug level monitoring: Secondary | ICD-10-CM

## 2021-08-27 DIAGNOSIS — I4891 Unspecified atrial fibrillation: Secondary | ICD-10-CM

## 2021-08-27 LAB — POCT INR: INR: 3.8 — AB (ref 2.0–3.0)

## 2021-08-27 NOTE — Patient Instructions (Signed)
Description   Hold today's dose and only take 0.5 tablet tomorrow and then continue taking warfarin 1 tablet daily except for 1/2 tablet on Monday, Wednesday and Friday.  - Recheck INR on 09/07/21 when Prednisone is complete.   Call if started on new meds or antibiotics [phone].

## 2021-09-07 ENCOUNTER — Ambulatory Visit (INDEPENDENT_AMBULATORY_CARE_PROVIDER_SITE_OTHER): Payer: Medicare Other

## 2021-09-07 ENCOUNTER — Other Ambulatory Visit: Payer: Self-pay

## 2021-09-07 DIAGNOSIS — Z5181 Encounter for therapeutic drug level monitoring: Secondary | ICD-10-CM

## 2021-09-07 DIAGNOSIS — I4891 Unspecified atrial fibrillation: Secondary | ICD-10-CM | POA: Diagnosis not present

## 2021-09-07 LAB — POCT INR: INR: 4.3 — AB (ref 2.0–3.0)

## 2021-09-07 NOTE — Patient Instructions (Addendum)
Description   Eat greens tonight, hold today's dose and only take 0.5 tablet tomorrow and then continue taking warfarin 1 tablet daily except for 1/2 tablet on Monday, Wednesday and Friday.  - Recheck INR in 2 weeks    Call if started on new meds or antibiotics [phone].

## 2021-09-21 ENCOUNTER — Other Ambulatory Visit: Payer: Self-pay

## 2021-09-21 ENCOUNTER — Ambulatory Visit (INDEPENDENT_AMBULATORY_CARE_PROVIDER_SITE_OTHER): Payer: Medicare Other | Admitting: *Deleted

## 2021-09-21 DIAGNOSIS — Z5181 Encounter for therapeutic drug level monitoring: Secondary | ICD-10-CM

## 2021-09-21 LAB — POCT INR: INR: 2.9 (ref 2.0–3.0)

## 2021-09-21 NOTE — Patient Instructions (Addendum)
Description   continue taking warfarin 1 tablet daily except for 1/2 tablet on Monday, Wednesday and Friday.  - Recheck INR in 3 weeks    Call if started on new meds or antibiotics #[phone].

## 2021-10-17 ENCOUNTER — Ambulatory Visit (INDEPENDENT_AMBULATORY_CARE_PROVIDER_SITE_OTHER): Payer: Medicare Other | Admitting: *Deleted

## 2021-10-17 ENCOUNTER — Ambulatory Visit (INDEPENDENT_AMBULATORY_CARE_PROVIDER_SITE_OTHER): Payer: Medicare Other | Admitting: Cardiology

## 2021-10-17 ENCOUNTER — Encounter: Payer: Self-pay | Admitting: Cardiology

## 2021-10-17 ENCOUNTER — Other Ambulatory Visit: Payer: Self-pay

## 2021-10-17 VITALS — BP 124/80 | HR 84 | Ht 64.0 in | Wt 287.0 lb

## 2021-10-17 DIAGNOSIS — Z5181 Encounter for therapeutic drug level monitoring: Secondary | ICD-10-CM | POA: Diagnosis not present

## 2021-10-17 DIAGNOSIS — G4733 Obstructive sleep apnea (adult) (pediatric): Secondary | ICD-10-CM

## 2021-10-17 DIAGNOSIS — I4891 Unspecified atrial fibrillation: Secondary | ICD-10-CM

## 2021-10-17 DIAGNOSIS — I10 Essential (primary) hypertension: Secondary | ICD-10-CM | POA: Diagnosis not present

## 2021-10-17 LAB — POCT INR: INR: 2.2 (ref 2.0–3.0)

## 2021-10-17 NOTE — Patient Instructions (Signed)
Description   ?Continue taking warfarin 1 tablet daily except for 1/2 tablet on Monday, Wednesday and Friday. Recheck INR in 4 weeks. Call if started on new meds or antibiotics [phone].  ?  ?  ?

## 2021-10-17 NOTE — Progress Notes (Addendum)
?Date:  10/17/2021  ? ?ID:  Tammy Robbins, DOB 1946-01-16, MRN [id number] ? ? ?PCP:  Burton Apley, MD  ?Cardiologist:  Kristeen Miss, MD  ?Sleep Medicine:  Armanda Magic, MD ?Electrophysiologist:  None  ? ?Chief Complaint:  OSA ? ?History of Present Illness:   ? ?Tammy Robbins is a 76 y.o. female  with a hx of AS, HTN and chronic AF who was referred for sleep study.  She underwent PSG on 08/02/2019 showing severe OSA with an AHI of 60/hr with severe nocturnal hypoxemia with O2 sats as low as 70%.  She underwent CPAP titration to 12cm H2O.   ? ?She is doing well with her CPAP device and thinks that she has gotten used to it.  She tolerates the mask but occasionally has a leak.  She feels the pressure is adequate.  Since going on CPAP she feels rested in the am and has no significant daytime sleepiness.  She denies any significant mouth or nasal dryness or nasal congestion.  She does not think that he snores.  She tells me she is sleeping 8-9 hours nightly and does not have to get up to use the bathroom. ? ?Prior CV studies:   ?The following studies were reviewed today: ? ? PAP compliance download in [place] ? ?Past Medical History:  ?Diagnosis Date  ? Anxiety   ? Chest tightness   ? Chronic atrial fibrillation (HCC)   ? Diastolic dysfunction   ? HTN (hypertension)   ? Hyperlipemia   ? Mild aortic stenosis   ? Mild tricuspid regurgitation   ? ?Past Surgical History:  ?Procedure Laterality Date  ? ABDOMINAL HYSTERECTOMY    ? BUNIONECTOMY    ? CARDIAC CATHETERIZATION    ? NORMAL  ? CATARACT EXTRACTION    ? CHOLECYSTECTOMY    ? EYE SURGERY    ? JOINT REPLACEMENT    ? PARTIAL HYSTERECTOMY    ? TOTAL KNEE ARTHROPLASTY    ? LEFT  ?  ? ?Current Meds  ?Medication Sig  ? allopurinol (ZYLOPRIM) 300 MG tablet Take 300 mg by mouth daily.  ? carvedilol (COREG) 25 MG tablet Take 1 tablet (25 mg total) by mouth 2 (two) times daily with a meal.  ? cephALEXin (KEFLEX) 250 MG capsule Take 250 mg by mouth daily.  ? chlordiazePOXIDE  (LIBRIUM) 10 MG capsule Take by mouth as needed.  ? clotrimazole (LOTRIMIN) 1 % cream Apply topically.  ? fish oil-omega-3 fatty acids 1000 MG capsule Take by mouth daily.  ? fluticasone-salmeterol (ADVAIR HFA) 115-21 MCG/ACT inhaler INHALE 2 PUFFS INTO THE LUNGS TWICE DAILY  ? gabapentin (NEURONTIN) 100 MG capsule Take 100 mg by mouth as needed.  ? hydrochlorothiazide (HYDRODIURIL) 25 MG tablet Take 1 tablet (25 mg total) by mouth daily.  ? levothyroxine (SYNTHROID, LEVOTHROID) 75 MCG tablet Take 75 mcg by mouth daily before breakfast.  ? meclizine (ANTIVERT) 12.5 MG tablet Take 12.5 mg by mouth 3 (three) times daily as needed for dizziness.  ? Multiple Vitamin (MULTIVITAMIN) tablet Take 1 tablet by mouth daily.  ? nitroGLYCERIN (NITROSTAT) 0.4 MG SL tablet Place 1 tablet (0.4 mg total) under the tongue every 5 (five) minutes as needed.  ? omeprazole (PRILOSEC) 20 MG capsule Take 20 mg by mouth daily.  ? potassium chloride (KLOR-CON) 10 MEQ tablet Take 1 tablet (10 mEq total) by mouth 2 (two) times daily.  ? simvastatin (ZOCOR) 20 MG tablet Take 20 mg by mouth at bedtime.  ? warfarin (COUMADIN) 5  MG tablet TAKE 1 TABLET BY MOUTH DAILY. EXCEPT 1/2 TABLET ON MONDAY, WEDNESDAY, FRIDAY OR AS DIRECTED BY COUMADIN CLINIC  ?  ? ?Allergies:   Diflucan [fluconazole]  ? ?Social History  ? ?Tobacco Use  ? Smoking status: Never  ? Smokeless tobacco: Never  ?Vaping Use  ? Vaping Use: Never used  ?Substance Use Topics  ? Alcohol use: Yes  ?  Comment: occasionally  ? Drug use: No  ?  ? ?Family Hx: ?The patient's family history includes Hypertension in her father; Intracerebral hemorrhage in her father; Lung cancer in her brother; Mental retardation in her sister; Pneumonia in her mother. ? ?ROS:   ?Please see the history of present illness.    ? ?All other systems reviewed and are negative. ? ? ?Labs/Other Tests and Data Reviewed:   ? ?Recent Labs: ?03/30/2021: ALT 14; BUN 20; Creatinine, Ser 0.83; Potassium 4.0; Sodium 142   ? ?Recent Lipid Panel ?Lab Results  ?Component Value Date/Time  ? CHOL 121 03/30/2021 09:12 AM  ? TRIG 90 03/30/2021 09:12 AM  ? HDL 49 03/30/2021 09:12 AM  ? CHOLHDL 2.5 03/30/2021 09:12 AM  ? CHOLHDL 4 08/18/2014 08:21 AM  ? LDLCALC 55 03/30/2021 09:12 AM  ? ? ?Wt Readings from Last 3 Encounters:  ?10/17/21 287 lb (130.2 kg)  ?03/30/21 282 lb 3.2 oz (128 kg)  ?10/24/20 279 lb 9.6 oz (126.8 kg)  ?  ? ?Objective:   ? ?Vital Signs:  BP 124/80   Pulse 84   Ht 5\' 4"  (1.626 m)   Wt 287 lb (130.2 kg)   SpO2 98%   BMI 49.26 kg/m?   ? ?GEN: Well nourished, well developed in no acute distress ?HEENT: Normal ?NECK: No JVD; No carotid bruits ?LYMPHATICS: No lymphadenopathy ?CARDIAC:irregularly irregular, no murmurs, rubs, gallops ?RESPIRATORY:  Clear to auscultation without rales, wheezing or rhonchi  ?ABDOMEN: Soft, non-tender, non-distended ?MUSCULOSKELETAL:  BLE edema; No deformity  ?SKIN: Warm and dry ?NEUROLOGIC:  Alert and oriented x 3 ?PSYCHIATRIC:  Normal affect   ? ?ASSESSMENT & PLAN:   ? ?1.  OSA - The patient is tolerating PAP therapy well without any problems. The PAP download performed by his DME was personally reviewed and interpreted by me today and showed an AHI of 0.9/hr on 12 cm H2O with 97% compliance in using more than 4 hours nightly.  The patient has been using and benefiting from PAP use and will continue to benefit from therapy.  ?  ?2.  HTN ?-Bp well controlled on exam today ?-Continue prescription drug management carvedilol 25 mg twice daily, HCTZ 25 mg daily with as needed refills ? ?Medication Adjustments/Labs and Tests Ordered: ?Current medicines are reviewed at length with the patient today.  Concerns regarding medicines are outlined above.  ?Tests Ordered: ?No orders of the defined types were placed in this encounter. ? ?Medication Changes: ?No orders of the defined types were placed in this encounter. ? ? ?Disposition:  Follow up in 1 year(s) ? ?Signed, ? , MD  ?10/17/2021 10:04  AM    ?[facility] ?

## 2021-10-17 NOTE — Patient Instructions (Signed)

## 2021-11-14 ENCOUNTER — Ambulatory Visit (INDEPENDENT_AMBULATORY_CARE_PROVIDER_SITE_OTHER): Payer: Medicare Other | Admitting: *Deleted

## 2021-11-14 DIAGNOSIS — Z5181 Encounter for therapeutic drug level monitoring: Secondary | ICD-10-CM | POA: Diagnosis not present

## 2021-11-14 DIAGNOSIS — I4891 Unspecified atrial fibrillation: Secondary | ICD-10-CM

## 2021-11-14 LAB — POCT INR: INR: 2.4 (ref 2.0–3.0)

## 2021-11-14 NOTE — Patient Instructions (Signed)
Description   ?Today and Saturday take 1/2 tablet then continue taking warfarin 1 tablet daily except for 1/2 tablet on Monday, Wednesday and Friday. Recheck INR in Monday (normally 4 weeks). Call if started on new meds or antibiotics [phone].  ? ?In the future when you get a new medication call before the upcoming appt.  ?  ?  ?

## 2021-11-19 ENCOUNTER — Ambulatory Visit (INDEPENDENT_AMBULATORY_CARE_PROVIDER_SITE_OTHER): Payer: Medicare Other

## 2021-11-19 DIAGNOSIS — Z5181 Encounter for therapeutic drug level monitoring: Secondary | ICD-10-CM

## 2021-11-19 DIAGNOSIS — I4891 Unspecified atrial fibrillation: Secondary | ICD-10-CM

## 2021-11-19 LAB — POCT INR: INR: 3 (ref 2.0–3.0)

## 2021-11-19 NOTE — Patient Instructions (Signed)
Description   ?Hold today's dose and then continue taking warfarin 1 tablet daily except for 1/2 tablet on Monday, Wednesday and Friday.  ?Recheck INR in 1 week (normally 4 weeks).  ?Call if started on new meds or antibiotics [phone].  ? ?In the future when you get a new medication call before the upcoming appt.  ?  ?   ?

## 2021-11-27 ENCOUNTER — Ambulatory Visit (INDEPENDENT_AMBULATORY_CARE_PROVIDER_SITE_OTHER): Payer: Medicare Other | Admitting: *Deleted

## 2021-11-27 DIAGNOSIS — Z5181 Encounter for therapeutic drug level monitoring: Secondary | ICD-10-CM

## 2021-11-27 DIAGNOSIS — I4891 Unspecified atrial fibrillation: Secondary | ICD-10-CM | POA: Diagnosis not present

## 2021-11-27 LAB — POCT INR: INR: 2.7 (ref 2.0–3.0)

## 2021-11-27 NOTE — Patient Instructions (Signed)
Description   ?Continue taking warfarin 1 tablet daily except for 1/2 tablet on Monday, Wednesday and Friday. Recheck INR in 4 weeks. Call if started on new meds or antibiotics [phone].  ? ?In the future when you get a new medication call before the upcoming appt.  ?  ?  ?

## 2021-12-11 ENCOUNTER — Other Ambulatory Visit: Payer: Self-pay | Admitting: Internal Medicine

## 2021-12-11 DIAGNOSIS — Z1231 Encounter for screening mammogram for malignant neoplasm of breast: Secondary | ICD-10-CM

## 2021-12-24 ENCOUNTER — Ambulatory Visit
Admission: RE | Admit: 2021-12-24 | Discharge: 2021-12-24 | Disposition: A | Discharge status: Home / Self Care | Payer: Medicare Other | Source: Ambulatory Visit | Attending: Family Medicine | Admitting: Family Medicine

## 2021-12-24 VITALS — BP 152/85 | HR 88 | Temp 97.4°F | Resp 18

## 2021-12-24 DIAGNOSIS — R350 Frequency of micturition: Secondary | ICD-10-CM | POA: Insufficient documentation

## 2021-12-24 LAB — POCT URINALYSIS DIP (MANUAL ENTRY)
Bilirubin, UA: NEGATIVE
Glucose, UA: NEGATIVE mg/dL
Ketones, POC UA: NEGATIVE mg/dL
Nitrite, UA: POSITIVE — AB
Spec Grav, UA: 1.01 (ref 1.010–1.025)
Urobilinogen, UA: 0.2 E.U./dL
pH, UA: 6.5 (ref 5.0–8.0)

## 2021-12-24 MED ORDER — AMOXICILLIN-POT CLAVULANATE 875-125 MG PO TABS
1.0000 | ORAL_TABLET | Freq: Two times a day (BID) | ORAL | 0 refills | Status: AC
Start: 2021-12-24 — End: 2021-12-31

## 2021-12-24 NOTE — Discharge Instructions (Addendum)
Your urinalysis shows white blood cells and blood and nitrites, most likely showing a urinary tract infection. We will send a culture of the urine. If your antibiotic needs to be changed according to the culture, staff will call you. ? ?Take amoxicillin-clavulanate 875-125--1 tablet twice daily with food for 7 days. ? ? ?

## 2021-12-24 NOTE — ED Triage Notes (Signed)
Patient presents to Urgent Care with complaints of urinary frequency, chills, dark urine since last week. Patient reports she was on keflex long term for utis but pt recently lost insurance coverage for that medication and now is having symptoms.   ?

## 2021-12-24 NOTE — ED Provider Notes (Signed)
?[facility] ? ? ? ?CSN: [id number] ?Arrival date & time: 12/24/21  1535 ? ? ?  ? ?History   ?Chief Complaint ?Chief Complaint  ?Patient presents with  ? Urinary Frequency  ?  I think i have a uti - Entered by patient  ? ? ?HPI ?Tammy Robbins is a 76 y.o. female.  ? ? ?Urinary Frequency ? ?Here for urinary frequency and pelvic pressure for about 3 days. No f/c/n/v.  ? ?She had been on keflex daily for uti prophylaxis, but she ran out 2 weeks ago, when insurance would not cover the tablets instead of the capsules she usually takes. ? ?PMH: Afib, on coumadin. ? ?Past Medical History:  ?Diagnosis Date  ? Anxiety   ? Chest tightness   ? Chronic atrial fibrillation (HCC)   ? Diastolic dysfunction   ? HTN (hypertension)   ? Hyperlipemia   ? Mild aortic stenosis   ? Mild tricuspid regurgitation   ? ? ?Patient Active Problem List  ? Diagnosis Date Noted  ? Morbid obesity ([place]) 03/30/2021  ? Mild intermittent asthma without complication 99991111  ? Restrictive lung disease 04/12/2019  ? Healthcare maintenance 04/12/2019  ? At risk for obstructive sleep apnea 04/12/2019  ? GERD (gastroesophageal reflux disease) 10/08/2016  ? Gout 10/08/2016  ? Hypothyroidism 10/08/2016  ? IBS (irritable bowel syndrome) 10/08/2016  ? Aortic valve disorder 07/29/2014  ? Encounter for therapeutic drug monitoring 09/08/2013  ? Chest tightness   ? Essential hypertension   ? Other and unspecified hyperlipidemia   ? Atrial fibrillation ([place])   ? Mild aortic stenosis   ? Diastolic dysfunction   ? Mild tricuspid regurgitation   ? Anxiety state   ? ? ?Past Surgical History:  ?Procedure Laterality Date  ? ABDOMINAL HYSTERECTOMY    ? BUNIONECTOMY    ? CARDIAC CATHETERIZATION    ? NORMAL  ? CATARACT EXTRACTION    ? CHOLECYSTECTOMY    ? EYE SURGERY    ? JOINT REPLACEMENT    ? PARTIAL HYSTERECTOMY    ? TOTAL KNEE ARTHROPLASTY    ? LEFT  ? ? ?OB History   ?No obstetric history on file. ?  ? ? ? ?Home Medications   ? ?Prior to Admission  medications   ?Medication Sig Start Date End Date Taking? Authorizing Provider  ?amoxicillin-clavulanate (AUGMENTIN) 875-125 MG tablet Take 1 tablet by mouth 2 (two) times daily for 7 days. 12/24/21 12/31/21 Yes Larue Lightner, Gwenlyn Perking, MD  ?albuterol (VENTOLIN HFA) 108 (90 Base) MCG/ACT inhaler Inhale 1-2 puffs into the lungs every 6 (six) hours as needed for wheezing or shortness of breath. ?Patient not taking: Reported on 03/30/2021 10/14/20   Wieters, Madelynn Done C, PA-C  ?allopurinol (ZYLOPRIM) 300 MG tablet Take 300 mg by mouth daily.    [provider]  ?carvedilol (COREG) 25 MG tablet Take 1 tablet (25 mg total) by mouth 2 (two) times daily with a meal. 04/02/21   Nahser, Wonda Cheng, MD  ?cephALEXin (KEFLEX) 250 MG capsule Take 250 mg by mouth daily.    [provider]  ?chlordiazePOXIDE (LIBRIUM) 10 MG capsule Take by mouth as needed. 07/13/20   [provider]  ?clotrimazole (LOTRIMIN) 1 % cream Apply topically. 05/31/20   [provider]  ?dextromethorphan-guaiFENesin (MUCINEX DM) 30-600 MG 12hr tablet Take 1 tablet by mouth 2 (two) times daily. ?Patient not taking: Reported on 03/30/2021 10/14/20   Janith Lima, PA-C  ?fish oil-omega-3 fatty acids 1000 MG capsule Take by mouth  daily.    [provider]  ?fluticasone-salmeterol (ADVAIR HFA) 115-21 MCG/ACT inhaler INHALE 2 PUFFS INTO THE LUNGS TWICE DAILY 05/23/21   Mannam, Hart Robinsons, MD  ?gabapentin (NEURONTIN) 100 MG capsule Take 100 mg by mouth as needed. 05/16/20   [provider]  ?hydrochlorothiazide (HYDRODIURIL) 25 MG tablet Take 1 tablet (25 mg total) by mouth daily. 04/02/21   Nahser, Wonda Cheng, MD  ?levothyroxine (SYNTHROID, LEVOTHROID) 75 MCG tablet Take 75 mcg by mouth daily before breakfast.    [provider]  ?loratadine (CLARITIN) 10 MG tablet Take 1 tablet (10 mg total) by mouth daily. ?Patient not taking: Reported on 03/30/2021 10/14/20   Wieters, Madelynn Done C, PA-C  ?meclizine (ANTIVERT) 12.5 MG tablet  Take 12.5 mg by mouth 3 (three) times daily as needed for dizziness.    [provider]  ?Multiple Vitamin (MULTIVITAMIN) tablet Take 1 tablet by mouth daily.    [provider]  ?nitroGLYCERIN (NITROSTAT) 0.4 MG SL tablet Place 1 tablet (0.4 mg total) under the tongue every 5 (five) minutes as needed. 08/18/14   Nahser, Wonda Cheng, MD  ?omeprazole (PRILOSEC) 20 MG capsule Take 20 mg by mouth daily.    [provider]  ?potassium chloride (KLOR-CON) 10 MEQ tablet Take 1 tablet (10 mEq total) by mouth 2 (two) times daily. 04/02/21   Nahser, Wonda Cheng, MD  ?simvastatin (ZOCOR) 20 MG tablet Take 20 mg by mouth at bedtime.    [provider]  ?warfarin (COUMADIN) 5 MG tablet TAKE 1 TABLET BY MOUTH DAILY. EXCEPT 1/2 TABLET ON MONDAY, WEDNESDAY, FRIDAY OR AS DIRECTED BY COUMADIN CLINIC 07/16/21   Nahser, Wonda Cheng, MD  ? ? ?Family History ?Family History  ?Problem Relation Age of Onset  ? Intracerebral hemorrhage Father   ? Hypertension Father   ? Pneumonia Mother   ? Mental retardation Sister   ? Lung cancer Brother   ? ? ?Social History ?Social History  ? ?Tobacco Use  ? Smoking status: Never  ? Smokeless tobacco: Never  ?Vaping Use  ? Vaping Use: Never used  ?Substance Use Topics  ? Alcohol use: Yes  ?  Comment: occasionally  ? Drug use: No  ? ? ? ?Allergies   ?Diflucan [fluconazole] ? ? ?Review of Systems ?Review of Systems  ?Genitourinary:  Positive for frequency.  ? ? ?Physical Exam ?Triage Vital Signs ?ED Triage Vitals  ?Enc Vitals Group  ?   BP 12/24/21 1624 (!) 152/85  ?   Pulse Rate 12/24/21 1624 88  ?   Resp 12/24/21 1624 18  ?   Temp 12/24/21 1624 (!) 97.4 ?F (36.3 ?C)  ?   Temp Source 12/24/21 1624 Oral  ?   SpO2 12/24/21 1624 97 %  ?   Weight --   ?   Height --   ?   Head Circumference --   ?   Peak Flow --   ?   Pain Score 12/24/21 1623 0  ?   Pain Loc --   ?   Pain Edu? --   ?   Excl. in [place]? --   ? ?No data found. ? ?Updated Vital Signs ?BP (!) 152/85   Pulse 88   Temp (!)  97.4 ?F (36.3 ?C) (Oral)   Resp 18   SpO2 97%  ? ?Visual Acuity ?Right Eye Distance:   ?Left Eye Distance:   ?Bilateral Distance:   ? ?Right Eye Near:   ?Left Eye Near:    ?Bilateral Near:    ? ?  Physical Exam ?Constitutional:   ?   General: She is not in acute distress. ?   Appearance: She is not ill-appearing, toxic-appearing or diaphoretic.  ?Cardiovascular:  ?   Rate and Rhythm: Normal rate and regular rhythm.  ?Pulmonary:  ?   Effort: Pulmonary effort is normal.  ?   Breath sounds: Normal breath sounds.  ?Skin: ?   Coloration: Skin is not pale.  ?Neurological:  ?   Mental Status: She is alert and oriented to person, place, and time.  ?Psychiatric:     ?   Behavior: Behavior normal.  ? ? ? ?UC Treatments / Results  ?Labs ?(all labs ordered are listed, but only abnormal results are displayed) ?Labs Reviewed  ?POCT URINALYSIS DIP (MANUAL ENTRY) - Abnormal; Notable for the following components:  ?    Result Value  ? Blood, UA moderate (*)   ? Protein Ur, POC trace (*)   ? Nitrite, UA Positive (*)   ? Leukocytes, UA Large (3+) (*)   ? All other components within normal limits  ?URINE CULTURE  ? ? ?EKG ? ? ?Radiology ?No results found. ? ?Procedures ?Procedures (including critical care time) ? ?Medications Ordered in UC ?Medications - No data to display ? ?Initial Impression / Assessment and Plan / UC Course  ?I have reviewed the triage vital signs and the nursing notes. ? ?Pertinent labs & imaging results that were available during my care of the patient were reviewed by me and considered in my medical decision making (see chart for details). ? ?  ? ?Urinalysis shows mod blood, positive nitrite, and large WBC. Will treat with alternate antibiotic, and send c/s. ?Final Clinical Impressions(s) / UC Diagnoses  ? ?Final diagnoses:  ?Urinary frequency  ? ? ? ?Discharge Instructions   ? ?  ?Your urinalysis shows white blood cells and blood and nitrites, most likely showing a urinary tract infection. We will send a  culture of the urine. If your antibiotic needs to be changed according to the culture, staff will call you. ? ?Take amoxicillin-clavulanate 875-125--1 tablet twice daily with food for 7 days. ? ? ? ? ? ? ?ED Prescriptions

## 2021-12-25 ENCOUNTER — Ambulatory Visit (INDEPENDENT_AMBULATORY_CARE_PROVIDER_SITE_OTHER): Payer: Medicare Other | Admitting: *Deleted

## 2021-12-25 DIAGNOSIS — I4891 Unspecified atrial fibrillation: Secondary | ICD-10-CM | POA: Diagnosis not present

## 2021-12-25 DIAGNOSIS — Z5181 Encounter for therapeutic drug level monitoring: Secondary | ICD-10-CM | POA: Diagnosis not present

## 2021-12-25 LAB — POCT INR: INR: 3.5 — AB (ref 2.0–3.0)

## 2021-12-25 NOTE — Patient Instructions (Signed)
Description   ?Do not take any Warfarin today then continue taking warfarin 1 tablet daily except for 1/2 tablet on Monday, Wednesday and Friday. Recheck INR in 3 weeks. Call if started on new meds or antibiotics [phone].  ? ?In the future when you get a new medication call before the upcoming appt.  ?  ?  ?

## 2021-12-26 ENCOUNTER — Ambulatory Visit
Admission: RE | Admit: 2021-12-26 | Discharge: 2021-12-26 | Disposition: A | Discharge status: Home / Self Care | Payer: Medicare Other | Source: Ambulatory Visit | Attending: Internal Medicine | Admitting: Internal Medicine

## 2021-12-26 DIAGNOSIS — Z1231 Encounter for screening mammogram for malignant neoplasm of breast: Secondary | ICD-10-CM

## 2021-12-26 LAB — URINE CULTURE: Culture: >=100000 — AB

## 2022-01-08 ENCOUNTER — Telehealth: Payer: Self-pay | Admitting: Pharmacist

## 2022-01-08 NOTE — Telephone Encounter (Signed)
Pt called Coumadin clinic, reports she's starting cipro 250mg  BID x 7 days with first dose today. Moved up INR check from 6/7 to 6/2 due to drug interaction.

## 2022-01-11 ENCOUNTER — Ambulatory Visit (INDEPENDENT_AMBULATORY_CARE_PROVIDER_SITE_OTHER): Payer: Medicare Other

## 2022-01-11 DIAGNOSIS — Z5181 Encounter for therapeutic drug level monitoring: Secondary | ICD-10-CM

## 2022-01-11 LAB — POCT INR: INR: 2.6 (ref 2.0–3.0)

## 2022-01-11 NOTE — Patient Instructions (Signed)
continue taking warfarin 1 tablet daily except for 1/2 tablet on Monday, Wednesday and Friday. Recheck INR in 2 weeks. Call if started on new meds or antibiotics [phone]. Cipro 6/30-6/6  In the future when you get a new medication call before the upcoming appt.

## 2022-01-28 ENCOUNTER — Ambulatory Visit (INDEPENDENT_AMBULATORY_CARE_PROVIDER_SITE_OTHER): Payer: Medicare Other | Admitting: *Deleted

## 2022-01-28 DIAGNOSIS — Z5181 Encounter for therapeutic drug level monitoring: Secondary | ICD-10-CM

## 2022-01-28 DIAGNOSIS — I4891 Unspecified atrial fibrillation: Secondary | ICD-10-CM | POA: Diagnosis not present

## 2022-01-28 LAB — POCT INR: INR: 2.2 (ref 2.0–3.0)

## 2022-01-28 NOTE — Patient Instructions (Addendum)
Description   Continue taking warfarin 1 tablet daily except for 1/2 tablet on Monday, Wednesday and Friday. Recheck INR in 3 weeks. Call if started on new meds or antibiotics #[phone].  In the future when you get a new medication call before the upcoming appt.

## 2022-02-01 ENCOUNTER — Other Ambulatory Visit: Payer: Self-pay | Admitting: Cardiovascular Disease

## 2022-02-18 ENCOUNTER — Ambulatory Visit (INDEPENDENT_AMBULATORY_CARE_PROVIDER_SITE_OTHER): Payer: Medicare Other

## 2022-02-18 DIAGNOSIS — I4891 Unspecified atrial fibrillation: Secondary | ICD-10-CM | POA: Diagnosis not present

## 2022-02-18 DIAGNOSIS — Z5181 Encounter for therapeutic drug level monitoring: Secondary | ICD-10-CM

## 2022-02-18 LAB — POCT INR: INR: 2.6 (ref 2.0–3.0)

## 2022-02-18 NOTE — Patient Instructions (Signed)
Description   Continue taking warfarin 1 tablet daily except for 1/2 tablet on Monday, Wednesday and Friday. Recheck INR in 4 weeks. Call if started on new meds or antibiotics #[phone].  In the future when you get a new medication call before the upcoming appt.

## 2022-03-18 ENCOUNTER — Encounter: Payer: Self-pay | Admitting: Cardiovascular Disease

## 2022-03-18 ENCOUNTER — Ambulatory Visit (INDEPENDENT_AMBULATORY_CARE_PROVIDER_SITE_OTHER): Payer: Medicare Other

## 2022-03-18 DIAGNOSIS — I4891 Unspecified atrial fibrillation: Secondary | ICD-10-CM

## 2022-03-18 DIAGNOSIS — Z5181 Encounter for therapeutic drug level monitoring: Secondary | ICD-10-CM

## 2022-03-18 LAB — POCT INR: INR: 3.5 — AB (ref 2.0–3.0)

## 2022-03-18 NOTE — Progress Notes (Unsigned)
Tammy Robbins Date of Birth  [birth year] [facility]     [place] [facility]  [address]    [address]   [address] [employer] [place], [place]  [address]    [place], [place]  [address] [phone]  Fax  [phone]  [phone]  Fax [phone]  Problems: 1. Atrial fibrillation 3. Hypertension 3. Hyperlipidemia 4. History chest pain-smooth and normal coronary arteries by cardiac catheterization   5. Hypothyroidism 6. Arthritis 7. Aortic stenosis - mild  8. obesity    Tammy Robbins is a 76 year old female with a history of atrial fibrillation. She's been on chronic Coumadin therapy. She's had a cardiac catheterization in the past which revealed smooth and normal coronary arteries. She also has a history of hypertension and hyperlipidemia. She has mild aortic stenosis. She's done well from a cardiac standpoint. She has not had any episodes of chest pain. She has had an upper respiratory tract infection since Thanksgiving.  September 10, 2102: Tammy Robbins has a history of atrial fibrillation. She's been on chronic Coumadin therapy. Her INR levels here.  She's been having some problems with arthritis recently. She denies any cardiac problems. She denies any chest pain or shortness breath or palpitations. She remains active but is not getting any regular exercise.  March 10, 2013:  Tammy Robbins is doing well. She is doing low impact exercises.    No CP or dyspnea.    Jan. 28, 2015:  Tammy Robbins is doing well.  Stays active.  Exercises regularly. No symptoms.   iNR levels are ok.  Did not take BP meds this am.  Cholesterol is managed by Dr. Su Hilt.    Jan. 7, 2016:  Tammy Robbins is a 76 year old female with a history of atrial fibrillation. She's been on chronic Coumadin therapy She has episodes of chest pain on a very rare basis.  Had a cath which showed normal coronaries.   Her NTG has expired. Has recurrent UTIs, seeing a urologist.  Jan. 18, 2017:  Doing well. Lots of fatigue. INR was 3.9  today .  Not getting much exercise.     October 24, 2016:  Tammy Robbins is seen today  For follow up of her atrial fib .   CHADS2VASC is  67   ( female, age 35 , HTN)  Has chronic AF.   Is on coumadin .   No CP or limitations   November 25, 2017:  Tammy Robbins is seen today for follow-up of her atrial fibrillation. Husband passed away last Feb 14, 2023 .  She has had Mohs surgery for a face cancer  Is on coumadin  - INR levels look good   March 20, 2020: Tammy Robbins is seen today for follow up .  She was last  seen in April, 2019.  She has a history of atrial fibrillation and is on Coumadin.  She has a history of mild aortic stenosis, history of chest pain with normal coronary arteries by heart catheterization, hypertension, hyperlipidemia..  She has OSA and is followed by Dr. Mayford Knife for her OSA Wt is 284 lbs.  INR  levels revewed and are theraputic  BP is a little elevated.   She has not taken her meds.  Has not lost any weight .. Dr Su Hilt is managing her lipids but she has not seen him in a while  Aug. 19, 2022: Tammy Robbins is seen today for follow up of her atrial fib Has OSA, mild AS Wt is 282 lbs. Is not limiting her carbs .  Going  to TOPS ( Take off weight sensibly ) We discussed limiting her carbs .   Aug. 8, 2023 Tammy Robbins is seen for follow up of her atrial fib, HTN, obesity, OSA  Has lots of pain - this may be the cause of her BP elevation   Wt is 286 ( up 4 lbs)     BP is normal at home typically  Lipids are managed by Dr. Mancel Bale       Current Outpatient Medications on File Prior to Visit  Medication Sig Dispense Refill   Acetaminophen (TYLENOL ARTHRITIS PAIN PO) Take 2 capsules by mouth every 8 (eight) hours as needed.     allopurinol (ZYLOPRIM) 300 MG tablet Take 300 mg by mouth daily.     carvedilol (COREG) 25 MG tablet Take 1 tablet (25 mg total) by mouth 2 (two) times daily with a meal. 180 tablet 3   cephALEXin (KEFLEX) 250 MG capsule Take 250 mg by mouth daily.     chlordiazePOXIDE (LIBRIUM) 10  MG capsule Take by mouth as needed.     clotrimazole (LOTRIMIN) 1 % cream Apply topically.     fish oil-omega-3 fatty acids 1000 MG capsule Take by mouth daily.     gabapentin (NEURONTIN) 100 MG capsule Take 100 mg by mouth as needed.     hydrochlorothiazide (HYDRODIURIL) 25 MG tablet Take 1 tablet (25 mg total) by mouth daily. 90 tablet 3   levothyroxine (SYNTHROID, LEVOTHROID) 75 MCG tablet Take 75 mcg by mouth daily before breakfast.     meclizine (ANTIVERT) 12.5 MG tablet Take 12.5 mg by mouth 3 (three) times daily as needed for dizziness.     Multiple Vitamin (MULTIVITAMIN) tablet Take 1 tablet by mouth daily.     nitroGLYCERIN (NITROSTAT) 0.4 MG SL tablet Place 1 tablet (0.4 mg total) under the tongue every 5 (five) minutes as needed. 25 tablet 6   omeprazole (PRILOSEC) 20 MG capsule Take 20 mg by mouth daily.     potassium chloride (KLOR-CON) 10 MEQ tablet Take 1 tablet (10 mEq total) by mouth 2 (two) times daily. 180 tablet 3   simvastatin (ZOCOR) 20 MG tablet Take 20 mg by mouth at bedtime.     warfarin (COUMADIN) 5 MG tablet TAKE 1 TABLET BY MOUTH DAILY. EXCEPT 1/2 TABLET ON MONDAY, WEDNESDAY, FRIDAY OR AS DIRECTED BY COUMADIN CLINIC 90 tablet 0   fluticasone-salmeterol (ADVAIR HFA) 115-21 MCG/ACT inhaler INHALE 2 PUFFS INTO THE LUNGS TWICE DAILY (Patient not taking: Reported on 03/19/2022) 12 g 5   No current facility-administered medications on file prior to visit.    Allergies  Allergen Reactions   Diflucan [Fluconazole] Rash    Past Medical History:  Diagnosis Date   Anxiety    Chest tightness    Chronic atrial fibrillation (HCC)    Diastolic dysfunction    HTN (hypertension)    Hyperlipemia    Mild aortic stenosis    Mild tricuspid regurgitation     Past Surgical History:  Procedure Laterality Date   ABDOMINAL HYSTERECTOMY     BUNIONECTOMY     CARDIAC CATHETERIZATION     NORMAL   CATARACT EXTRACTION     CHOLECYSTECTOMY     EYE SURGERY     JOINT REPLACEMENT      PARTIAL HYSTERECTOMY     TOTAL KNEE ARTHROPLASTY     LEFT    Social History   Tobacco Use  Smoking Status Never  Smokeless Tobacco Never    Social History   Substance  and Sexual Activity  Alcohol Use Yes   Comment: occasionally    Family History  Problem Relation Age of Onset   Intracerebral hemorrhage Father    Hypertension Father    Pneumonia Mother    Mental retardation Sister    Lung cancer Brother     Reviw of Systems:   Noted in current history, otherwise systems are negative.  Physical Exam: Blood pressure (!) 130/90, pulse 93, height 5\' 4"  (1.626 m), weight 286 lb 9.6 oz (130 kg), SpO2 95 %.  ( Did not take BP meds today )   GEN:  morbidly obese female,  in no acute distress HEENT: Normal NECK: No JVD; No carotid bruits LYMPHATICS: No lymphadenopathy CARDIAC: irreg. Irreg.  Soft systolic murmur  RESPIRATORY:  Clear to auscultation without rales, wheezing or rhonchi  ABDOMEN: Soft, non-tender, non-distended MUSCULOSKELETAL:  No edema; No deformity  SKIN: Warm and dry NEUROLOGIC:  Alert and oriented x 3    ECG: March 19, 2022: Atrial fibrillation with a rate of 93.  No ST or T wave changes.   Assessment / Plan:   1. Atrial fibrillation-    [place] has persistent atrial fibrillation.  Continue Coumadin.   3. Hypertension -    blood pressure is typically well controlled at home.  It is a little bit higher here today.  She did not take her blood pressure medicines today.   3. Hyperlipidemia -lipid levels look good.  Dr. [place] Lions has been managing her lipids.   4. History chest pain-   5. Hypothyroidism  6. Arthritis-  7. Aortic stenosis -she has mild aortic stenosis.  8. Obesity -needs to work on carb reduction , calorie reduction       Su Hilt, MD  03/19/2022 8:32 AM    [facility] [address] [place],  [address] [place], [place]  [place] Pager [phone] Phone: [phone]; Fax: [phone]

## 2022-03-18 NOTE — Patient Instructions (Signed)
Description   Hold today's dose and then continue taking warfarin 1 tablet daily except for 1/2 tablet on Monday, Wednesday and Friday. Recheck INR in 4 weeks. Call if started on new meds or antibiotics #[phone].  In the future when you get a new medication call before the upcoming appt.

## 2022-03-19 ENCOUNTER — Encounter: Payer: Self-pay | Admitting: Cardiovascular Disease

## 2022-03-19 ENCOUNTER — Ambulatory Visit (INDEPENDENT_AMBULATORY_CARE_PROVIDER_SITE_OTHER): Payer: Medicare Other | Admitting: Cardiovascular Disease

## 2022-03-19 VITALS — BP 130/90 | HR 93 | Ht 64.0 in | Wt 286.6 lb

## 2022-03-19 DIAGNOSIS — I350 Nonrheumatic aortic (valve) stenosis: Secondary | ICD-10-CM

## 2022-03-19 DIAGNOSIS — I4891 Unspecified atrial fibrillation: Secondary | ICD-10-CM | POA: Diagnosis not present

## 2022-03-19 DIAGNOSIS — I359 Nonrheumatic aortic valve disorder, unspecified: Secondary | ICD-10-CM | POA: Diagnosis not present

## 2022-03-19 DIAGNOSIS — I10 Essential (primary) hypertension: Secondary | ICD-10-CM

## 2022-03-19 NOTE — Patient Instructions (Signed)
Medication Instructions:  Your physician recommends that you continue on your current medications as directed. Please refer to the Current Medication list given to you today.  *If you need a refill on your cardiac medications before your next appointment, please call your pharmacy*  Lab Work: If you have labs (blood work) drawn today and your tests are completely normal, you will receive your results only by: MyChart Message (if you have MyChart) OR A paper copy in the mail If you have any lab test that is abnormal or we need to change your treatment, we will call you to review the results.  Testing/Procedures: None ordered today.  Follow-Up: At [facility], you and your health needs are our priority.  As part of our continuing mission to provide you with exceptional heart care, we have created designated Provider Care Teams.  These Care Teams include your primary Cardiologist (physician) and Advanced Practice Providers (APPs -  Physician Assistants and Nurse Practitioners) who all work together to provide you with the care you need, when you need it.  We recommend signing up for the patient portal called "MyChart".  Sign up information is provided on this After Visit Summary.  MyChart is used to connect with patients for Virtual Visits (Telemedicine).  Patients are able to view lab/test results, encounter notes, upcoming appointments, etc.  Non-urgent messages can be sent to your provider as well.   To learn more about what you can do with MyChart, go to [email or web address].    Your next appointment:   1 year(s)  The format for your next appointment:   In Person  Provider:   Philip Nahser, MD {   Important Information About Sugar       

## 2022-03-22 ENCOUNTER — Other Ambulatory Visit: Payer: Self-pay | Admitting: Cardiovascular Disease

## 2022-03-25 ENCOUNTER — Other Ambulatory Visit: Payer: Self-pay

## 2022-03-25 MED ORDER — POTASSIUM CHLORIDE ER 10 MEQ PO TBCR: 10.0000 meq | EXTENDED_RELEASE_TABLET | Freq: Two times a day (BID) | ORAL | 3 refills | Status: DC

## 2022-04-17 ENCOUNTER — Ambulatory Visit: Payer: Medicare Other

## 2022-04-17 ENCOUNTER — Telehealth: Payer: Self-pay | Admitting: *Deleted

## 2022-04-17 NOTE — Telephone Encounter (Signed)
Pt called and stated she held her warfarin last night per her Pharmacist telling her she might out to do so since taking Paxlovid. Advised pt that COVID can mess with the clotting factors and to resume her warfarin. She will take an extra 1/2 tablet since she missed last nights dose then resume normal dose of warfarin and report to appt next week.

## 2022-04-26 ENCOUNTER — Ambulatory Visit: Payer: Medicare Other | Attending: Cardiovascular Disease | Admitting: *Deleted

## 2022-04-26 DIAGNOSIS — Z5181 Encounter for therapeutic drug level monitoring: Secondary | ICD-10-CM

## 2022-04-26 DIAGNOSIS — I4891 Unspecified atrial fibrillation: Secondary | ICD-10-CM | POA: Diagnosis not present

## 2022-04-26 LAB — POCT INR: INR: 2.3 (ref 2.0–3.0)

## 2022-04-26 NOTE — Patient Instructions (Signed)
Description   Continue taking warfarin 1 tablet daily except for 1/2 tablet on Monday, Wednesday and Friday. Recheck INR in 4 weeks. Call if started on new meds or antibiotics #[phone].  In the future when you get a new medication call before the upcoming appt.

## 2022-05-09 ENCOUNTER — Other Ambulatory Visit: Payer: Self-pay | Admitting: Cardiovascular Disease

## 2022-05-24 ENCOUNTER — Ambulatory Visit: Payer: Medicare Other | Attending: Cardiovascular Disease | Admitting: *Deleted

## 2022-05-24 DIAGNOSIS — I4891 Unspecified atrial fibrillation: Secondary | ICD-10-CM | POA: Diagnosis not present

## 2022-05-24 DIAGNOSIS — Z5181 Encounter for therapeutic drug level monitoring: Secondary | ICD-10-CM | POA: Diagnosis not present

## 2022-05-24 LAB — POCT INR: INR: 2.4 (ref 2.0–3.0)

## 2022-05-24 NOTE — Patient Instructions (Signed)
Description   Continue taking warfarin 1 tablet daily except for 1/2 tablet on Monday, Wednesday and Friday. Recheck INR in 5 weeks. Call if started on new meds or antibiotics [phone].  In the future when you get a new medication call before the upcoming appt.

## 2022-07-02 ENCOUNTER — Ambulatory Visit: Payer: Medicare Other | Attending: Internal Medicine | Admitting: *Deleted

## 2022-07-02 DIAGNOSIS — I4891 Unspecified atrial fibrillation: Secondary | ICD-10-CM | POA: Diagnosis not present

## 2022-07-02 DIAGNOSIS — Z5181 Encounter for therapeutic drug level monitoring: Secondary | ICD-10-CM | POA: Diagnosis not present

## 2022-07-02 LAB — POCT INR: INR: 3.2 — AB (ref 2.0–3.0)

## 2022-07-02 NOTE — Patient Instructions (Signed)
Description   Today take 1/2 tablet then continue taking warfarin 1 tablet daily except for 1/2 tablet on Monday, Wednesday and Friday. Recheck INR in 4 weeks. Call if started on new meds or antibiotics #[phone].  In the future when you get a new medication call before the upcoming appt.

## 2022-07-08 ENCOUNTER — Encounter: Payer: Self-pay | Admitting: Cardiovascular Disease

## 2022-07-30 ENCOUNTER — Ambulatory Visit: Payer: Medicare Other | Attending: Cardiology | Admitting: *Deleted

## 2022-07-30 DIAGNOSIS — Z5181 Encounter for therapeutic drug level monitoring: Secondary | ICD-10-CM | POA: Diagnosis not present

## 2022-07-30 DIAGNOSIS — I4891 Unspecified atrial fibrillation: Secondary | ICD-10-CM

## 2022-07-30 LAB — POCT INR: INR: 3 (ref 2.0–3.0)

## 2022-07-30 NOTE — Patient Instructions (Signed)
Description   Continue taking warfarin 1 tablet daily except for 1/2 tablet on Monday, Wednesday and Friday. Recheck INR in 5 weeks. Call if started on new meds or antibiotics #[phone].  In the future when you get a new medication call before the upcoming appt.

## 2022-08-13 ENCOUNTER — Other Ambulatory Visit: Payer: Self-pay | Admitting: Cardiovascular Disease

## 2022-09-03 ENCOUNTER — Ambulatory Visit: Payer: Medicare Other | Attending: Cardiology

## 2022-09-03 DIAGNOSIS — I4891 Unspecified atrial fibrillation: Secondary | ICD-10-CM | POA: Diagnosis not present

## 2022-09-03 DIAGNOSIS — Z5181 Encounter for therapeutic drug level monitoring: Secondary | ICD-10-CM

## 2022-09-03 LAB — POCT INR
INR: 3.8 — AB (ref 2.0–3.0)
INR: 3.8 — AB (ref 2.0–3.0)

## 2022-09-03 NOTE — Patient Instructions (Signed)
Description   Skip today's dosage of Warfarin, then resume same dosage of Warfarin 1 tablet daily except for 1/2 tablet on Monday, Wednesday and Friday. Recheck INR in 3 weeks. Call if started on new meds or antibiotics [phone].  In the future when you get a new medication call before the upcoming appt.

## 2022-09-20 ENCOUNTER — Ambulatory Visit: Payer: Medicare Other | Attending: Cardiovascular Disease

## 2022-09-20 DIAGNOSIS — I4891 Unspecified atrial fibrillation: Secondary | ICD-10-CM | POA: Diagnosis not present

## 2022-09-20 DIAGNOSIS — Z5181 Encounter for therapeutic drug level monitoring: Secondary | ICD-10-CM | POA: Diagnosis not present

## 2022-09-20 LAB — POCT INR: INR: 3.1 — AB (ref 2.0–3.0)

## 2022-09-20 NOTE — Patient Instructions (Signed)
Description   Skip today's dosage of Warfarin, then resume same dosage of Warfarin 1 tablet daily except for 1/2 tablet on Monday, Wednesday and Friday. Recheck INR in 4 weeks. Call if started on new meds or antibiotics [phone].  In the future when you get a new medication call before the upcoming appt.

## 2022-10-18 ENCOUNTER — Ambulatory Visit
Payer: Medicare Other | Attending: Cardiovascular Disease | Admitting: Pharmacist Clinician (PhC)/ Clinical Pharmacy Specialist

## 2022-10-18 DIAGNOSIS — Z5181 Encounter for therapeutic drug level monitoring: Secondary | ICD-10-CM

## 2022-10-18 DIAGNOSIS — I4891 Unspecified atrial fibrillation: Secondary | ICD-10-CM

## 2022-10-18 LAB — POCT INR: INR: 2.8 (ref 2.0–3.0)

## 2022-10-18 NOTE — Patient Instructions (Signed)
Continue with warfarin 1 tablet daily except for 1/2 tablet on Monday, Wednesday and Friday. Recheck INR in 4 weeks. Call if started on new meds or antibiotics [phone].  In the future when you get a new medication call before the upcoming appt.

## 2022-11-12 ENCOUNTER — Ambulatory Visit: Payer: Medicare Other | Attending: Cardiology | Admitting: Cardiology

## 2022-11-12 ENCOUNTER — Encounter: Payer: Self-pay | Admitting: Cardiology

## 2022-11-12 ENCOUNTER — Telehealth: Payer: Self-pay | Admitting: *Deleted

## 2022-11-12 VITALS — BP 120/60 | HR 100 | Ht 64.0 in | Wt 281.0 lb

## 2022-11-12 DIAGNOSIS — I10 Essential (primary) hypertension: Secondary | ICD-10-CM

## 2022-11-12 DIAGNOSIS — G4733 Obstructive sleep apnea (adult) (pediatric): Secondary | ICD-10-CM

## 2022-11-12 DIAGNOSIS — Z9189 Other specified personal risk factors, not elsewhere classified: Secondary | ICD-10-CM

## 2022-11-12 NOTE — Telephone Encounter (Signed)
Per Dr Radford Pax, she had [facility] before but never got set up with a DME so please set up with new DME and order new supplies   DME selection is [facility] Patient understands he will be contacted by [facility] to set up his cpap. Patient understands to call if [facility] does not contact him with new setup in a timely manner. Patient understands they will be called once confirmation has been received from [facility] that they have received their new machine to schedule 10 week follow up appointment.   [facility] notified of new cpap order  Please add to airview Patient was grateful for the call and thanked me.

## 2022-11-12 NOTE — Progress Notes (Signed)
SLEEP MEDICINE VIRTUAL VISIT      Virtual Visit via Video Note   Because of Tammy Robbins's co-morbid illnesses, she is at least at moderate risk for complications without adequate follow up.  This format is felt to be most appropriate for this patient at this time.  All issues noted in this document were discussed and addressed.  A limited physical exam was performed with this format.  Please refer to the patient's chart for her consent to telehealth for [name]       Date:  11/12/2022   ID:  Tammy Robbins, DOB 1946/02/25, MRN [id number] The patient was identified using 2 identifiers.  Patient Location: Home Provider Location: Home Office   PCP:  Lorene Dy, MD   [facility] Cardiologist:  Mertie Moores, MD Sleep Medicine:  Fransico Him, MD {   Evaluation Performed:  Follow-Up Visit  Chief Complaint:  OSA  History of Present Illness:    Tammy Robbins is a 77 y.o. female with a hx of AS, HTN and chronic AF who was referred for sleep study. She underwent PSG on 08/02/2019 showing severe OSA with an AHI of 60/hr with severe nocturnal hypoxemia with O2 sats as low as 70%. She underwent CPAP titration to 12cm H2O.   She is doing well with her PAP device and thinks that she has gotten used to it.  She tolerates the full face mask and feels the pressure is adequate.  Since going on PAP she feels rested in the am and has no significant daytime sleepiness.  She denies any significant mouth or nasal dryness or nasal congestion.  She does not think that he snores.     Past Medical History:  Diagnosis Date   Anxiety    Chest tightness    Chronic atrial fibrillation    Diastolic dysfunction    HTN (hypertension)    Hyperlipemia    Mild aortic stenosis    Mild tricuspid regurgitation    Past Surgical History:  Procedure Laterality Date   ABDOMINAL HYSTERECTOMY     BUNIONECTOMY     CARDIAC CATHETERIZATION     NORMAL   CATARACT EXTRACTION      CHOLECYSTECTOMY     EYE SURGERY     JOINT REPLACEMENT     PARTIAL HYSTERECTOMY     TOTAL KNEE ARTHROPLASTY     LEFT     Current Meds  Medication Sig   Acetaminophen (TYLENOL ARTHRITIS PAIN PO) Take 2 capsules by mouth every 8 (eight) hours as needed.   allopurinol (ZYLOPRIM) 300 MG tablet Take 300 mg by mouth daily.   carvedilol (COREG) 25 MG tablet TAKE 1 TABLET(25 MG) BY MOUTH TWICE DAILY WITH A MEAL   cephALEXin (KEFLEX) 250 MG capsule Take 250 mg by mouth daily.   chlordiazePOXIDE (LIBRIUM) 10 MG capsule Take by mouth as needed.   clotrimazole (LOTRIMIN) 1 % cream Apply topically.   fish oil-omega-3 fatty acids 1000 MG capsule Take by mouth daily.   folic acid (FOLVITE) 1 MG tablet Take 1 mg by mouth daily.   hydrochlorothiazide (HYDRODIURIL) 25 MG tablet TAKE 1 TABLET(25 MG) BY MOUTH DAILY   levothyroxine (SYNTHROID, LEVOTHROID) 75 MCG tablet Take 75 mcg by mouth daily before breakfast.   meclizine (ANTIVERT) 12.5 MG tablet Take 12.5 mg by mouth 3 (three) times daily as needed for dizziness.   methotrexate (RHEUMATREX) 2.5 MG tablet Take 2.5 mg by mouth once a week.   Multiple Vitamin (MULTIVITAMIN) tablet  Take 1 tablet by mouth daily.   omeprazole (PRILOSEC) 20 MG capsule Take 20 mg by mouth daily.   potassium chloride (KLOR-CON) 10 MEQ tablet Take 1 tablet (10 mEq total) by mouth 2 (two) times daily.   simvastatin (ZOCOR) 20 MG tablet Take 20 mg by mouth at bedtime.   warfarin (COUMADIN) 5 MG tablet TAKE 1 TABLET BY MOUTH DAILY. EXCEPT 1/2 TABLET ON MONDAY, WEDNESDAY, FRIDAY OR AS DIRECTED BY COUMADIN CLINIC     Allergies:   Diflucan [fluconazole]   Social History   Tobacco Use   Smoking status: Never   Smokeless tobacco: Never  Vaping Use   Vaping Use: Never used  Substance Use Topics   Alcohol use: Yes    Comment: occasionally   Drug use: No     Family Hx: The patient's family history includes Hypertension in her father; Intracerebral hemorrhage in her father;  Lung cancer in her brother; Mental retardation in her sister; Pneumonia in her mother.  ROS:   Please see the history of present illness.     All other systems reviewed and are negative.   Prior Sleep studies:   The following studies were reviewed today:  PAP compliance download  Labs/Other Tests and Data Reviewed:     Recent Labs: No results found for requested labs within last 365 days.   Wt Readings from Last 3 Encounters:  11/12/22 281 lb (127.5 kg)  03/19/22 286 lb 9.6 oz (130 kg)  10/17/21 287 lb (130.2 kg)       Objective:    Vital Signs:  BP 120/60   Pulse 100   Ht 5\' 4"  (1.626 m)   Wt 281 lb (127.5 kg)   SpO2 98%   BMI 48.23 kg/m    VITAL SIGNS:  reviewed GEN:  no acute distress EYES:  sclerae anicteric, EOMI - Extraocular Movements Intact RESPIRATORY:  normal respiratory effort, symmetric expansion CARDIOVASCULAR:  no peripheral edema SKIN:  no rash, lesions or ulcers. MUSCULOSKELETAL:  no obvious deformities. NEURO:  alert and oriented x 3, no obvious focal deficit PSYCH:  normal affect  ASSESSMENT & PLAN:    OSA - The patient is tolerating PAP therapy well without any problems. The PAP download performed by his DME was personally reviewed and interpreted by me today and showed an AHI of 0.6/hr on 12 cm H2O with 100% compliance in using more than 4 hours nightly.  The patient has been using and benefiting from PAP use and will continue to benefit from therapy.   HTN -BP controlled -continue prescription drug management with Carvedilol 25mg  BID, HCTZ 25mg  daily with PRN refills    Total time of encounter: 15 minutes total time of encounter, including 10 minutes spent in face-to-face patient care on the date of this encounter. This time includes coordination of care and counseling regarding above mentioned problem list. Remainder of non-face-to-face time involved reviewing chart documents/testing relevant to the patient encounter and documentation in  the medical record. I have independently reviewed documentation from referring provider.    Medication Adjustments/Labs and Tests Ordered: Current medicines are reviewed at length with the patient today.  Concerns regarding medicines are outlined above.   Tests Ordered: No orders of the defined types were placed in this encounter.   Medication Changes: No orders of the defined types were placed in this encounter.   Follow Up:  In Person in 1 year(s)  Signed, Fransico Him, MD  11/12/2022 8:29 AM    [facility]

## 2022-11-12 NOTE — Patient Instructions (Signed)
Medication Instructions:  Your physician recommends that you continue on your current medications as directed. Please refer to the Current Medication list given to you today.  *If you need a refill on your cardiac medications before your next appointment, please call your pharmacy*   Lab Work: None.  If you have labs (blood work) drawn today and your tests are completely normal, you will receive your results only by: MyChart Message (if you have MyChart) OR A paper copy in the mail If you have any lab test that is abnormal or we need to change your treatment, we will call you to review the results.   Testing/Procedures: None.   Follow-Up: At [facility], you and your health needs are our priority.  As part of our continuing mission to provide you with exceptional heart care, we have created designated Provider Care Teams.  These Care Teams include your primary Cardiologist (physician) and Advanced Practice Providers (APPs -  Physician Assistants and Nurse Practitioners) who all work together to provide you with the care you need, when you need it.  We recommend signing up for the patient portal called "MyChart".  Sign up information is provided on this After Visit Summary.  MyChart is used to connect with patients for Virtual Visits (Telemedicine).  Patients are able to view lab/test results, encounter notes, upcoming appointments, etc.  Non-urgent messages can be sent to your provider as well.   To learn more about what you can do with MyChart, go to [email or web address].    Your next appointment:   1 year(s)  Provider:   Dr. Traci Turner, MD   

## 2022-11-13 ENCOUNTER — Telehealth: Payer: Self-pay | Admitting: *Deleted

## 2022-11-13 NOTE — Telephone Encounter (Signed)
Pt called to report that she was started on prednisone. Dose is prednisone 5mg  tablet; 4/2-30mg , 4/3-30mg , 4/4-30mg , 4/5-30mg , 4/6-20mg , 4/7-20mg , 4/8-20mg , 4/9-20mg , 4/10-20mg . Pt is aware we need to check her INR since the med interacts with warfarin. She set an appt for Friday at 815am.

## 2022-11-15 ENCOUNTER — Ambulatory Visit: Payer: Medicare Other | Attending: Cardiovascular Disease | Admitting: Pharmacist

## 2022-11-15 DIAGNOSIS — Z5181 Encounter for therapeutic drug level monitoring: Secondary | ICD-10-CM

## 2022-11-15 DIAGNOSIS — I4891 Unspecified atrial fibrillation: Secondary | ICD-10-CM | POA: Diagnosis not present

## 2022-11-15 LAB — POCT INR: INR: 3.1 — AB (ref 2.0–3.0)

## 2022-11-15 NOTE — Patient Instructions (Signed)
Description   Hold dose today and then continue with warfarin 1 tablet daily except for 1/2 tablet on Monday, Wednesday and Friday. Recheck INR in 1 week. Call if started on new meds or antibiotics #[phone].  In the future when you get a new medication call before the upcoming appt.

## 2022-11-22 ENCOUNTER — Ambulatory Visit: Payer: Medicare Other | Attending: Cardiovascular Disease | Admitting: *Deleted

## 2022-11-22 DIAGNOSIS — I4891 Unspecified atrial fibrillation: Secondary | ICD-10-CM

## 2022-11-22 DIAGNOSIS — Z5181 Encounter for therapeutic drug level monitoring: Secondary | ICD-10-CM | POA: Diagnosis not present

## 2022-11-22 LAB — POCT INR: INR: 2.5 (ref 2.0–3.0)

## 2022-11-22 NOTE — Patient Instructions (Addendum)
Description   Continue taking warfarin 1 tablet daily except for 1/2 tablet on Monday, Wednesday and Friday. Recheck INR in 4 weeks. Call if started on new meds or antibiotics #[phone].  In the future when you get a new medication call before the upcoming appt.      

## 2022-11-26 ENCOUNTER — Other Ambulatory Visit: Payer: Self-pay | Admitting: Internal Medicine

## 2022-11-26 DIAGNOSIS — Z1231 Encounter for screening mammogram for malignant neoplasm of breast: Secondary | ICD-10-CM

## 2022-12-20 ENCOUNTER — Ambulatory Visit: Payer: Medicare Other

## 2022-12-23 ENCOUNTER — Ambulatory Visit: Payer: Medicare Other | Attending: Internal Medicine

## 2022-12-23 DIAGNOSIS — Z5181 Encounter for therapeutic drug level monitoring: Secondary | ICD-10-CM

## 2022-12-23 LAB — POCT INR: INR: 5 — AB (ref 2.0–3.0)

## 2022-12-23 NOTE — Patient Instructions (Signed)
HOLD TODAY, TUESDAY and WEDNESDAY then Continue taking warfarin 1 tablet daily except for 1/2 tablet on Monday, Wednesday and Friday. Recheck INR in 1 week. Call if started on new meds or antibiotics #[phone].  In the future when you get a new medication call before the upcoming appt.

## 2022-12-30 ENCOUNTER — Ambulatory Visit: Payer: Medicare Other | Attending: Cardiovascular Disease | Admitting: *Deleted

## 2022-12-30 DIAGNOSIS — Z5181 Encounter for therapeutic drug level monitoring: Secondary | ICD-10-CM

## 2022-12-30 LAB — POCT INR: POC INR: 2.4

## 2022-12-30 NOTE — Patient Instructions (Signed)
Description   Continue taking warfarin 1 tablet daily except for 1/2 tablet on Monday, Wednesday and Friday. Recheck INR in 1 week. Call if started on new meds or antibiotics #[phone].  In the future when you get a new medication call before the upcoming appt.

## 2023-01-02 ENCOUNTER — Ambulatory Visit: Payer: Medicare Other

## 2023-01-07 ENCOUNTER — Ambulatory Visit: Payer: Medicare Other | Attending: Cardiovascular Disease

## 2023-01-07 DIAGNOSIS — Z5181 Encounter for therapeutic drug level monitoring: Secondary | ICD-10-CM

## 2023-01-07 DIAGNOSIS — I4891 Unspecified atrial fibrillation: Secondary | ICD-10-CM

## 2023-01-07 LAB — POCT INR: INR: 2.9 (ref 2.0–3.0)

## 2023-01-07 NOTE — Patient Instructions (Signed)
Description   Continue taking warfarin 1 tablet daily except for 1/2 tablet on Mondays, Wednesdays and Fridays. Recheck INR in 4 weeks. Call if started on new meds or antibiotics #[phone].  In the future when you get a new medication call before the upcoming appt.

## 2023-01-16 ENCOUNTER — Ambulatory Visit
Admission: RE | Admit: 2023-01-16 | Discharge: 2023-01-16 | Disposition: A | Discharge status: Home / Self Care | Payer: Medicare Other | Source: Ambulatory Visit | Attending: Internal Medicine | Admitting: Internal Medicine

## 2023-01-16 DIAGNOSIS — Z1231 Encounter for screening mammogram for malignant neoplasm of breast: Secondary | ICD-10-CM

## 2023-01-27 ENCOUNTER — Other Ambulatory Visit: Payer: Self-pay | Admitting: Cardiovascular Disease

## 2023-02-04 ENCOUNTER — Ambulatory Visit: Payer: Medicare Other | Attending: Cardiology | Admitting: *Deleted

## 2023-02-04 DIAGNOSIS — Z5181 Encounter for therapeutic drug level monitoring: Secondary | ICD-10-CM

## 2023-02-04 DIAGNOSIS — I4891 Unspecified atrial fibrillation: Secondary | ICD-10-CM

## 2023-02-04 LAB — POCT INR: INR: 4.5 — AB (ref 2.0–3.0)

## 2023-02-04 NOTE — Patient Instructions (Signed)
Description   Do not take any warfarin today and no warfarin tomorrow then continue taking warfarin 1 tablet daily except for 1/2 tablet on Mondays, Wednesdays and Fridays. Recheck INR in 2 weeks in [place]. Call if started on new meds or antibiotics #[phone].  In the future when you get a new medication call before the upcoming appt.

## 2023-02-20 ENCOUNTER — Ambulatory Visit (INDEPENDENT_AMBULATORY_CARE_PROVIDER_SITE_OTHER): Payer: Medicare Other

## 2023-02-20 DIAGNOSIS — Z5181 Encounter for therapeutic drug level monitoring: Secondary | ICD-10-CM

## 2023-02-20 DIAGNOSIS — I4891 Unspecified atrial fibrillation: Secondary | ICD-10-CM

## 2023-02-20 LAB — PROTIME-INR
INR: 3.4 — ABNORMAL HIGH (ref 0.9–1.2)
Prothrombin Time: 33.6 s — ABNORMAL HIGH (ref 9.1–12.0)

## 2023-02-20 NOTE — Patient Instructions (Signed)
Description   Called and spoke with pt. Instructed to only take 1/2 tablet today and then START taking warfarin 1 tablet daily except for 1/2 tablet on Sunday, Mondays, Wednesdays and Fridays.  Recheck INR in 2 weeks in [place].  Call if started on new meds or antibiotics #[phone].  In the future when you get a new medication call before the upcoming appt.

## 2023-03-04 ENCOUNTER — Telehealth: Payer: Self-pay

## 2023-03-04 NOTE — Telephone Encounter (Signed)
Patient called to report starting Cefuroxime for 10 days.  I recommended eating greens every other day and having INR drawn at [facility] near [place] on Thursday 7/25.  She verbalized understanding.

## 2023-03-06 ENCOUNTER — Telehealth: Payer: Self-pay | Admitting: *Deleted

## 2023-03-06 NOTE — Telephone Encounter (Signed)
Pt returned call and states she is at lab now having PT/INR drawn. Will await for results.

## 2023-03-06 NOTE — Telephone Encounter (Signed)
Called pt since INR is due. There was no answer and had to leave a voicemail.   Per last note pt was told to have INR done since starting cefuroxime, this med does not interact with warfarin and is safe to take with warfarin. Will await a call back & follow up with pt.

## 2023-03-07 ENCOUNTER — Ambulatory Visit (INDEPENDENT_AMBULATORY_CARE_PROVIDER_SITE_OTHER): Payer: Medicare Other | Admitting: Cardiovascular Disease

## 2023-03-07 DIAGNOSIS — Z5181 Encounter for therapeutic drug level monitoring: Secondary | ICD-10-CM

## 2023-03-20 ENCOUNTER — Ambulatory Visit: Payer: Medicare Other | Attending: Cardiovascular Disease | Admitting: *Deleted

## 2023-03-20 DIAGNOSIS — I4891 Unspecified atrial fibrillation: Secondary | ICD-10-CM | POA: Diagnosis not present

## 2023-03-20 DIAGNOSIS — Z5181 Encounter for therapeutic drug level monitoring: Secondary | ICD-10-CM

## 2023-03-20 LAB — POCT INR: INR: 3.2 — AB (ref 2.0–3.0)

## 2023-03-20 NOTE — Patient Instructions (Signed)
Description   Today take 1/2 tablet of warfarin then continue taking warfarin 1/2 tablet daily except for 1 tablet on Tuesday, Thursday, and Saturday.  Recheck INR in 2 weeks. Call if started on new meds or antibiotics #[phone].  In the future when you get a new medication call before the upcoming appt.

## 2023-03-25 ENCOUNTER — Ambulatory Visit: Payer: Medicare Other | Attending: Cardiovascular Disease | Admitting: Cardiovascular Disease

## 2023-03-25 ENCOUNTER — Encounter: Payer: Self-pay | Admitting: Cardiovascular Disease

## 2023-03-25 VITALS — BP 108/82 | HR 97 | Ht 64.0 in | Wt 265.6 lb

## 2023-03-25 DIAGNOSIS — I4891 Unspecified atrial fibrillation: Secondary | ICD-10-CM

## 2023-03-25 DIAGNOSIS — I10 Essential (primary) hypertension: Secondary | ICD-10-CM | POA: Diagnosis not present

## 2023-03-25 NOTE — Patient Instructions (Signed)

## 2023-03-25 NOTE — Progress Notes (Signed)
Tammy Robbins Date of Birth  1946-01-30 [facility]     [place] [facility]  [address]    [address]   [address] [place], [place]  [address]    [place], [place]  [address] [phone]  Fax  [phone]  [phone]  Fax [phone]  Problems: 1. Atrial fibrillation 3. Hypertension 3. Hyperlipidemia 4. History chest pain-smooth and normal coronary arteries by cardiac catheterization   5. Hypothyroidism 6. Arthritis 7. Aortic stenosis - mild  8. obesity    Tammy Robbins is a 77 year old female with a history of atrial fibrillation. She's been on chronic Coumadin therapy. She's had a cardiac catheterization in the past which revealed smooth and normal coronary arteries. She also has a history of hypertension and hyperlipidemia. She has mild aortic stenosis. She's done well from a cardiac standpoint. She has not had any episodes of chest pain. She has had an upper respiratory tract infection since Thanksgiving.  September 10, 2102: Tammy Robbins has a history of atrial fibrillation. She's been on chronic Coumadin therapy. Her INR levels here.  She's been having some problems with arthritis recently. She denies any cardiac problems. She denies any chest pain or shortness breath or palpitations. She remains active but is not getting any regular exercise.  March 10, 2013:  Tammy Robbins is doing well. She is doing low impact exercises.    No CP or dyspnea.    Jan. 28, 2015:  Tammy Robbins is doing well.  Stays active.  Exercises regularly. No symptoms.   iNR levels are ok.  Did not take BP meds this am.  Cholesterol is managed by Dr. Su Hilt.    Jan. 7, 2016:  Tammy Robbins is a 77 year old female with a history of atrial fibrillation. She's been on chronic Coumadin therapy She has episodes of chest pain on a very rare basis.  Had a cath which showed normal coronaries.   Her NTG has expired. Has recurrent UTIs, seeing a urologist.  Jan. 18, 2017:  Doing well. Lots of fatigue. INR was 3.9  today .  Not getting much exercise.     October 24, 2016:  Tammy Robbins is seen today  For follow up of her atrial fib .   CHADS2VASC is  38   ( female, age 34 , HTN)  Has chronic AF.   Is on coumadin .   No CP or limitations   November 25, 2017:  Tammy Robbins is seen today for follow-up of her atrial fibrillation. Husband passed away last 02-24-23 .  She has had Mohs surgery for a face cancer  Is on coumadin  - INR levels look good   March 20, 2020: Tammy Robbins is seen today for follow up .  She was last  seen in April, 2019.  She has a history of atrial fibrillation and is on Coumadin.  She has a history of mild aortic stenosis, history of chest pain with normal coronary arteries by heart catheterization, hypertension, hyperlipidemia..  She has OSA and is followed by Dr. Mayford Knife for her OSA Wt is 284 lbs.  INR  levels revewed and are theraputic  BP is a little elevated.   She has not taken her meds.  Has not lost any weight .. Dr Su Hilt is managing her lipids but she has not seen him in a while  Aug. 19, 2022: Tammy Robbins is seen today for follow up of her atrial fib Has OSA, mild AS Wt is 282 lbs. Is not limiting her carbs .  Going  to TOPS ( Take off weight sensibly ) We discussed limiting her carbs .   Aug. 8, 2023 Tammy Robbins is seen for follow up of her atrial fib, HTN, obesity, OSA  Has lots of pain - this may be the cause of her BP elevation   Wt is 286 ( up 4 lbs)     BP is normal at home typically  Lipids are managed by Dr. Su Hilt    March 25, 2023:   Tammy Robbins is seen today for follow-up of her atrial fibrillation, hypertension, obesity, obstructive sleep apnea. INRs are well controlled in general .  3.2 on Aug. 8.    Current Outpatient Medications on File Prior to Visit  Medication Sig Dispense Refill   Acetaminophen (TYLENOL ARTHRITIS PAIN PO) Take 2 capsules by mouth every 8 (eight) hours as needed.     allopurinol (ZYLOPRIM) 300 MG tablet Take 300 mg by mouth daily.     carvedilol (COREG) 25 MG tablet  TAKE 1 TABLET(25 MG) BY MOUTH TWICE DAILY WITH A MEAL 180 tablet 3   cephALEXin (KEFLEX) 250 MG capsule Take 250 mg by mouth daily.     clotrimazole (LOTRIMIN) 1 % cream Apply topically.     fish oil-omega-3 fatty acids 1000 MG capsule Take by mouth daily.     folic acid (FOLVITE) 1 MG tablet Take 1 mg by mouth daily.     hydrochlorothiazide (HYDRODIURIL) 25 MG tablet TAKE 1 TABLET(25 MG) BY MOUTH DAILY 90 tablet 3   levothyroxine (SYNTHROID, LEVOTHROID) 75 MCG tablet Take 75 mcg by mouth daily before breakfast.     meclizine (ANTIVERT) 12.5 MG tablet Take 12.5 mg by mouth 3 (three) times daily as needed for dizziness.     methotrexate (RHEUMATREX) 2.5 MG tablet Take 2.5 mg by mouth once a week.     Multiple Vitamin (MULTIVITAMIN) tablet Take 1 tablet by mouth daily.     nitroGLYCERIN (NITROSTAT) 0.4 MG SL tablet Place 1 tablet (0.4 mg total) under the tongue every 5 (five) minutes as needed. 25 tablet 6   omeprazole (PRILOSEC) 20 MG capsule Take 20 mg by mouth daily.     potassium chloride (KLOR-CON) 10 MEQ tablet Take 1 tablet (10 mEq total) by mouth 2 (two) times daily. 180 tablet 3   simvastatin (ZOCOR) 20 MG tablet Take 20 mg by mouth at bedtime.     warfarin (COUMADIN) 5 MG tablet TAKE 1 TABLET BY MOUTH DAILY. EXCEPT 1/2 TABLET ON MONDAY, WEDNESDAY, FRIDAY OR AS DIRECTED BY COUMADIN CLINIC 90 tablet 0   chlordiazePOXIDE (LIBRIUM) 10 MG capsule Take by mouth as needed. (Patient not taking: Reported on 03/25/2023)     gabapentin (NEURONTIN) 100 MG capsule Take 100 mg by mouth as needed. (Patient not taking: Reported on 11/12/2022)     No current facility-administered medications on file prior to visit.    Allergies  Allergen Reactions   Diflucan [Fluconazole] Rash    Past Medical History:  Diagnosis Date   Anxiety    Chest tightness    Chronic atrial fibrillation (HCC)    Diastolic dysfunction    HTN (hypertension)    Hyperlipemia    Mild aortic stenosis    Mild tricuspid  regurgitation     Past Surgical History:  Procedure Laterality Date   ABDOMINAL HYSTERECTOMY     BUNIONECTOMY     CARDIAC CATHETERIZATION     NORMAL   CATARACT EXTRACTION     CHOLECYSTECTOMY     EYE SURGERY  JOINT REPLACEMENT     PARTIAL HYSTERECTOMY     TOTAL KNEE ARTHROPLASTY     LEFT    Social History   Tobacco Use  Smoking Status Never  Smokeless Tobacco Never    Social History   Substance and Sexual Activity  Alcohol Use Yes   Comment: occasionally    Family History  Problem Relation Age of Onset   Intracerebral hemorrhage Father    Hypertension Father    Pneumonia Mother    Mental retardation Sister    Lung cancer Brother     Reviw of Systems:   Noted in current history, otherwise systems are negative.   Physical Exam: Blood pressure 108/82, pulse 97, height 5\' 4"  (1.626 m), weight 265 lb 9.6 oz (120.5 kg), SpO2 95%.       GEN:  moderately obese , elderly female  in no acute distress HEENT: Normal NECK: No JVD; No carotid bruits LYMPHATICS: No lymphadenopathy CARDIAC: irreg. Irreg.  RESPIRATORY:  Clear to auscultation without rales, wheezing or rhonchi  ABDOMEN: Soft, non-tender, non-distended MUSCULOSKELETAL:  No edema; No deformity  SKIN: Warm and dry NEUROLOGIC:  Alert and oriented x 3     ECG:   EKG Interpretation Date/Time:  Tuesday March 25 2023 08:17:53 EDT Ventricular Rate:  90 PR Interval:    QRS Duration:  68 QT Interval:  370 QTC Calculation: 452 R Axis:   73  Text Interpretation: atrial fib with controlled rate Low voltage QRS Septal infarct , age undetermined No previous ECGs available Confirmed by Kristeen Miss [phone]) on 03/25/2023 8:42:30 AM     Assessment / Plan:   1. Atrial fibrillation-     chronic Af.   Continue warfarin    3. Hypertension -    blood pressure is well-controlled.  3. Hyperlipidemia -she has has lipids managed by Dr. Su Hilt.  Her last LDL looks good.   4. History chest pain-   5.  Hypothyroidism  6. Arthritis-  7. Aortic stenosis -   8. Obesity -continue to work on weight loss efforts.   Will see her in 1 year    Kristeen Miss, MD  03/25/2023 8:34 AM    [facility] [address] [place],  [address] [place], [place]  [address] Pager [phone] Phone: [phone]; Fax: [phone]

## 2023-04-04 ENCOUNTER — Ambulatory Visit: Payer: Medicare Other | Attending: Cardiovascular Disease

## 2023-04-04 DIAGNOSIS — Z5181 Encounter for therapeutic drug level monitoring: Secondary | ICD-10-CM

## 2023-04-04 LAB — POCT INR: INR: 4.2 — AB (ref 2.0–3.0)

## 2023-04-04 NOTE — Patient Instructions (Signed)
HOLD TODAY and SATURDAY  then continue taking warfarin 1/2 tablet daily except for 1 tablet on Tuesday, Thursday, and Saturday.  Recheck INR in 2 weeks. Call if started on new meds or antibiotics #[phone].  In the future when you get a new medication call before the upcoming appt.

## 2023-04-07 ENCOUNTER — Other Ambulatory Visit: Payer: Self-pay | Admitting: Cardiovascular Disease

## 2023-04-11 ENCOUNTER — Telehealth: Payer: Self-pay | Admitting: *Deleted

## 2023-04-11 NOTE — Telephone Encounter (Signed)
Pt called to report that she has been prescribed tramadol as needed three times a day. Advised that med is safe to take with warfarin and she verbalized understanding.

## 2023-04-18 ENCOUNTER — Ambulatory Visit: Payer: Medicare Other | Attending: Cardiology

## 2023-04-18 DIAGNOSIS — Z5181 Encounter for therapeutic drug level monitoring: Secondary | ICD-10-CM

## 2023-04-18 LAB — POCT INR: INR: 3 (ref 2.0–3.0)

## 2023-04-18 NOTE — Patient Instructions (Signed)
Description   Continue taking warfarin 1/2 tablet daily except for 1 tablet on Tuesday, Thursday, and Saturday.   Recheck INR in 4 weeks. Call if started on new meds or antibiotics #[phone].  In the future when you get a new medication call before the upcoming appt.

## 2023-04-23 ENCOUNTER — Telehealth: Payer: Self-pay

## 2023-04-23 NOTE — Telephone Encounter (Signed)
Pt called and stated she has a UTI and was prescribed Macrobid 100mg  every 12hrs. Made pt aware that this does NOT interact with Warfarin. Pt states when cultures come back, her mediation may change. Instructed pt to call Coumadin Clinic if she is prescribed any other new medications.

## 2023-05-14 ENCOUNTER — Encounter: Payer: Self-pay | Admitting: Orthopedic Surgery

## 2023-05-14 ENCOUNTER — Other Ambulatory Visit: Payer: Self-pay | Admitting: Orthopedic Surgery

## 2023-05-14 DIAGNOSIS — M25511 Pain in right shoulder: Secondary | ICD-10-CM

## 2023-05-16 ENCOUNTER — Ambulatory Visit: Payer: Medicare Other | Attending: Cardiology

## 2023-05-16 DIAGNOSIS — Z5181 Encounter for therapeutic drug level monitoring: Secondary | ICD-10-CM | POA: Diagnosis not present

## 2023-05-16 LAB — POCT INR: INR: 3.2 — AB (ref 2.0–3.0)

## 2023-05-16 NOTE — Patient Instructions (Signed)
Continue taking warfarin 1/2 tablet daily except for 1 tablet on Tuesday, Thursday, and Saturday.  Eat greens tonight. Recheck INR in 4 weeks. Call if started on new meds or antibiotics #[phone].  In the future when you get a new medication call before the upcoming appt.

## 2023-05-23 ENCOUNTER — Ambulatory Visit: Payer: Medicare Other | Attending: Cardiovascular Disease

## 2023-05-23 ENCOUNTER — Other Ambulatory Visit: Payer: Self-pay | Admitting: Cardiovascular Disease

## 2023-05-23 DIAGNOSIS — Z5181 Encounter for therapeutic drug level monitoring: Secondary | ICD-10-CM | POA: Diagnosis not present

## 2023-05-23 LAB — POCT INR: INR: 3.8 — AB (ref 2.0–3.0)

## 2023-05-23 NOTE — Patient Instructions (Signed)
HOLD TODAY and SATURDAY THEN Continue taking warfarin 1/2 tablet daily except for 1 tablet on Tuesday, Thursday, and Saturday.  Eat greens EVERY OTHER DAY WHILE ON Prednisone. Recheck INR in 2 weeks. Call if started on new meds or antibiotics #[phone].  In the future when you get a new medication call before the upcoming appt.

## 2023-06-04 ENCOUNTER — Ambulatory Visit
Admission: RE | Admit: 2023-06-04 | Discharge: 2023-06-04 | Disposition: A | Discharge status: Home / Self Care | Payer: Medicare Other | Source: Ambulatory Visit | Attending: Orthopedic Surgery | Admitting: Orthopedic Surgery

## 2023-06-04 DIAGNOSIS — M25511 Pain in right shoulder: Secondary | ICD-10-CM

## 2023-06-04 MED ORDER — IOPAMIDOL (ISOVUE-M 200) INJECTION 41%
15.0000 mL | Freq: Once | INTRAMUSCULAR | Status: AC
  Administered 2023-06-04: 15 mL via INTRA_ARTICULAR

## 2023-06-06 ENCOUNTER — Ambulatory Visit: Payer: Medicare Other | Attending: Cardiovascular Disease | Admitting: *Deleted

## 2023-06-06 DIAGNOSIS — Z5181 Encounter for therapeutic drug level monitoring: Secondary | ICD-10-CM

## 2023-06-06 LAB — POCT INR: POC INR: 4.3

## 2023-06-06 NOTE — Patient Instructions (Signed)
Description   HOLD TODAY and SATURDAY THEN START taking 1/2 a tablet daily except for 1 tablet on Tuesday and Saturday Recheck INR in 2 weeks. Call if started on new meds or antibiotics #[phone].

## 2023-06-20 ENCOUNTER — Ambulatory Visit: Payer: Medicare Other | Attending: Cardiovascular Disease | Admitting: *Deleted

## 2023-06-20 DIAGNOSIS — Z5181 Encounter for therapeutic drug level monitoring: Secondary | ICD-10-CM

## 2023-06-20 DIAGNOSIS — I4891 Unspecified atrial fibrillation: Secondary | ICD-10-CM | POA: Diagnosis not present

## 2023-06-20 LAB — POCT INR: INR: 2.5 (ref 2.0–3.0)

## 2023-06-20 NOTE — Patient Instructions (Addendum)
Description   Continue taking 1/2 tablet daily except for 1 tablet on Tuesday and Saturday.  Recheck INR in 4 weeks. Call if started on new meds or antibiotics #[phone].

## 2023-07-15 ENCOUNTER — Ambulatory Visit: Payer: Medicare Other | Attending: Cardiovascular Disease | Admitting: *Deleted

## 2023-07-15 DIAGNOSIS — Z5181 Encounter for therapeutic drug level monitoring: Secondary | ICD-10-CM

## 2023-07-15 DIAGNOSIS — I4891 Unspecified atrial fibrillation: Secondary | ICD-10-CM

## 2023-07-15 LAB — POCT INR: INR: 1.9 — AB (ref 2.0–3.0)

## 2023-07-15 NOTE — Patient Instructions (Signed)
Description   Today take 1.5 tablets of warfarin then continue taking 1/2 tablet daily except for 1 tablet on Tuesday and Saturday.  Recheck INR in 4 weeks. Call if started on new meds or antibiotics #[phone].

## 2023-08-12 ENCOUNTER — Ambulatory Visit: Payer: Medicare Other

## 2023-08-20 ENCOUNTER — Ambulatory Visit: Payer: Medicare Other | Attending: Internal Medicine

## 2023-08-20 DIAGNOSIS — Z5181 Encounter for therapeutic drug level monitoring: Secondary | ICD-10-CM

## 2023-08-20 LAB — POCT INR: INR: 1.8 — AB (ref 2.0–3.0)

## 2023-08-20 NOTE — Patient Instructions (Signed)
 Description   Today take 1 tablet of warfarin then continue taking 1/2 tablet daily except for 1 tablet on Tuesday and Saturday.  Stay consistent with greens each week (1 per week).  Recheck INR in 3 weeks. Call if started on new meds or antibiotics #[phone].

## 2023-09-06 ENCOUNTER — Other Ambulatory Visit: Payer: Self-pay | Admitting: Cardiovascular Disease

## 2023-09-10 ENCOUNTER — Ambulatory Visit: Payer: Medicare Other | Attending: Cardiovascular Disease

## 2023-09-10 DIAGNOSIS — Z5181 Encounter for therapeutic drug level monitoring: Secondary | ICD-10-CM

## 2023-09-10 LAB — POCT INR: INR: 2.1 (ref 2.0–3.0)

## 2023-09-10 NOTE — Patient Instructions (Signed)
Description   Continue taking 1/2 tablet daily except for 1 tablet on Tuesday and Saturday.  Stay consistent with greens each week (1 per week).  Recheck INR in 4 weeks. Call if started on new meds or antibiotics #[phone].

## 2023-09-19 ENCOUNTER — Telehealth: Payer: Self-pay | Admitting: *Deleted

## 2023-09-19 NOTE — Telephone Encounter (Signed)
   Pre-operative Risk Assessment    Patient Name: Tammy Robbins  DOB: Jul 11, 1946 MRN: [id number]   Date of last office visit: 03/25/2023 Date of next office visit: 11/2023 [facility] FOR SLEEP   Request for Surgical Clearance    Procedure:   COLONOSCOPY  Date of Surgery:  Clearance 10/14/23                                Surgeon:  DR. JERRELL SOL Surgeon's Group or Practice Name:  [facility] Phone number:  [phone] Fax number:  [phone]   Type of Clearance Requested:   - Medical  - Pharmacy:  Hold Warfarin (Coumadin ) NOT INDICATED   Type of Anesthesia:   PROPOFOL   Additional requests/questions:    [facility]   09/19/2023, 2:32 PM

## 2023-09-24 ENCOUNTER — Telehealth: Payer: Self-pay | Admitting: *Deleted

## 2023-09-24 NOTE — Telephone Encounter (Signed)
Patient with diagnosis of afib on warfarin for anticoagulation.    Procedure: COLONOSCOPY  Date of procedure: 10/14/23   CHA2DS2-VASc Score = 4   This indicates a 4.8% annual risk of stroke. The patient's score is based upon: CHF History: 0 HTN History: 1 Diabetes History: 0 Stroke History: 0 Vascular Disease History: 0 Age Score: 2 Gender Score: 1    CrCl 78 mL/min Platelet count 275 K    Per office protocol, patient can hold warfarin for 5 days prior to procedure.     **This guidance is not considered finalized until pre-operative APP has relayed final recommendations.**

## 2023-09-24 NOTE — Telephone Encounter (Signed)
Patient scheduled 2/27

## 2023-09-24 NOTE — Telephone Encounter (Signed)
  Patient Consent for Virtual Visit         Tammy Robbins has provided verbal consent on 09/24/2023 for a virtual visit (video or telephone).   CONSENT FOR VIRTUAL VISIT FOR:  Tammy Robbins  By participating in this virtual visit I agree to the following:  I hereby voluntarily request, consent and authorize [facility] and its employed or contracted physicians, physician assistants, nurse practitioners or other licensed health care professionals (the Practitioner), to provide me with telemedicine health care services (the "Services") as deemed necessary by the treating Practitioner. I acknowledge and consent to receive the Services by the Practitioner via telemedicine. I understand that the telemedicine visit will involve communicating with the Practitioner through live audiovisual communication technology and the disclosure of certain medical information by electronic transmission. I acknowledge that I have been given the opportunity to request an in-person assessment or other available alternative prior to the telemedicine visit and am voluntarily participating in the telemedicine visit.  I understand that I have the right to withhold or withdraw my consent to the use of telemedicine in the course of my care at any time, without affecting my right to future care or treatment, and that the Practitioner or I may terminate the telemedicine visit at any time. I understand that I have the right to inspect all information obtained and/or recorded in the course of the telemedicine visit and may receive copies of available information for a reasonable fee.  I understand that some of the potential risks of receiving the Services via telemedicine include:  Delay or interruption in medical evaluation due to technological equipment failure or disruption; Information transmitted may not be sufficient (e.g. poor resolution of images) to allow for appropriate medical decision making by the Practitioner;  and/or  In rare instances, security protocols could fail, causing a breach of personal health information.  Furthermore, I acknowledge that it is my responsibility to provide information about my medical history, conditions and care that is complete and accurate to the best of my ability. I acknowledge that Practitioner's advice, recommendations, and/or decision may be based on factors not within their control, such as incomplete or inaccurate data provided by me or distortions of diagnostic images or specimens that may result from electronic transmissions. I understand that the practice of medicine is not an exact science and that Practitioner makes no warranties or guarantees regarding treatment outcomes. I acknowledge that a copy of this consent can be made available to me via my patient portal [facility] MyChart), or I can request a printed copy by calling the office of [facility].    I understand that my insurance will be billed for this visit.   I have read or had this consent read to me. I understand the contents of this consent, which adequately explains the benefits and risks of the Services being provided via telemedicine.  I have been provided ample opportunity to ask questions regarding this consent and the Services and have had my questions answered to my satisfaction. I give my informed consent for the services to be provided through the use of telemedicine in my medical care

## 2023-09-24 NOTE — Telephone Encounter (Signed)
   Name: Tammy Robbins  DOB: 1946-04-30  MRN: [id number]  Primary Cardiologist: Kristeen Miss, MD   Preoperative team, please contact this patient and set up a phone call appointment for further preoperative risk assessment. Please obtain consent and complete medication review. Thank you for your help.  I confirm that guidance regarding antiplatelet and oral anticoagulation therapy has been completed and, if necessary, noted below.  Per office protocol, patient can hold warfarin for 5 days prior to procedure.    I also confirmed the patient resides in the state of [place]. As per [facility] Medical Board telemedicine laws, the patient must reside in the state in which the provider is licensed.   Napoleon Form, Leodis Rains, NP 09/24/2023, 2:56 PM [facility]

## 2023-10-08 ENCOUNTER — Ambulatory Visit: Payer: Medicare Other | Attending: Cardiovascular Disease | Admitting: *Deleted

## 2023-10-08 DIAGNOSIS — Z5181 Encounter for therapeutic drug level monitoring: Secondary | ICD-10-CM | POA: Diagnosis not present

## 2023-10-08 DIAGNOSIS — I4891 Unspecified atrial fibrillation: Secondary | ICD-10-CM | POA: Diagnosis not present

## 2023-10-08 LAB — POCT INR: INR: 2 (ref 2.0–3.0)

## 2023-10-08 NOTE — Patient Instructions (Addendum)
 Description   Continue taking 1/2 tablet daily except for 1 tablet on Tuesday and Saturday.  Stay consistent with greens each week (1 per week).  Recheck INR in 1 week post procedure. Call if started on new meds or antibiotics #[phone].     Hold warfarin for upcoming procedure on 10/09/23-10/13/23 10/14/23-take 1.5 tablets of warfarin  10/15/23-take 1 tablet of warfarin then resume normal dose, which is, 1/2 tablet daily except for 1 tablet on Tuesday and Saturday.

## 2023-10-09 ENCOUNTER — Ambulatory Visit: Payer: Medicare Other | Attending: Cardiology | Admitting: Emergency Medicine

## 2023-10-09 DIAGNOSIS — Z01810 Encounter for preprocedural cardiovascular examination: Secondary | ICD-10-CM | POA: Diagnosis not present

## 2023-10-09 NOTE — Progress Notes (Signed)
 Virtual Visit via Telephone Note   Because of Tammy Robbins co-morbid illnesses, she is at least at moderate risk for complications without adequate follow up.  This format is felt to be most appropriate for this patient at this time.  Due to technical limitations with video connection (technology), today's appointment will be conducted as an audio only telehealth visit, and Tammy Robbins verbally agreed to proceed in this manner.   All issues noted in this document were discussed and addressed.  No physical exam could be performed with this format.  Evaluation Performed:  Preoperative cardiovascular risk assessment _____________   Date:  10/09/2023   Patient ID:  Tammy Robbins, DOB Dec 21, 1945, MRN [id number] Patient Location:  Home Provider location:   Office  Primary Care Provider:  Burton Apley, MD Primary Cardiologist:  Kristeen Miss, MD  Chief Complaint / Patient Profile   78 y.o. y/o female with a h/o atrial fibrillation on Coumadin, hypertension, hyperlipidemia, mild aortic stenosis, hypothyroidism, arthritis, obesity who is pending colonoscopy with [facility] on 10/14/2023 and presents today for telephonic preoperative cardiovascular risk assessment.  History of Present Illness    Tammy Robbins is a 77 y.o. female who presents via audio/video conferencing for a telehealth visit today.  Pt was last seen in cardiology clinic on 03/25/2023 by Dr. Elease Hashimoto.  At that time Tammy Robbins was doing well.  The patient is now pending procedure as outlined above. Since her last visit, she denies chest pain, shortness of breath, lower extremity edema, fatigue, palpitations, diaphoresis, weakness, presyncope, syncope, orthopnea, and PND.  Past Medical History    Past Medical History:  Diagnosis Date   Anxiety    Chest tightness    Chronic atrial fibrillation (HCC)    Diastolic dysfunction    HTN (hypertension)    Hyperlipemia    Mild aortic stenosis    Mild tricuspid regurgitation    Past  Surgical History:  Procedure Laterality Date   ABDOMINAL HYSTERECTOMY     BUNIONECTOMY     CARDIAC CATHETERIZATION     NORMAL   CATARACT EXTRACTION     CHOLECYSTECTOMY     EYE SURGERY     JOINT REPLACEMENT     PARTIAL HYSTERECTOMY     TOTAL KNEE ARTHROPLASTY     LEFT    Allergies  Allergies  Allergen Reactions   Diflucan [Fluconazole] Rash    Home Medications    Prior to Admission medications   Medication Sig Start Date End Date Taking? Authorizing Provider  Acetaminophen (TYLENOL ARTHRITIS PAIN PO) Take 2 capsules by mouth every 8 (eight) hours as needed.    [provider]  allopurinol (ZYLOPRIM) 300 MG tablet Take 300 mg by mouth daily.    [provider]  carvedilol (COREG) 25 MG tablet TAKE 1 TABLET(25 MG) BY MOUTH TWICE DAILY WITH A MEAL 04/09/23   Nahser, Deloris Ping, MD  cephALEXin (KEFLEX) 250 MG capsule Take 250 mg by mouth daily.    [provider]  chlordiazePOXIDE (LIBRIUM) 10 MG capsule Take by mouth as needed. 07/13/20   [provider]  clotrimazole (LOTRIMIN) 1 % cream Apply topically. 05/31/20   [provider]  etanercept (ENBREL SURECLICK) 50 MG/ML injection Inject 50 mg into the skin once a week.    [provider]  fish oil-omega-3 fatty acids 1000 MG capsule Take by mouth daily.    [provider]  folic acid (FOLVITE) 1 MG tablet Take 1 mg by mouth daily.  [provider]  gabapentin (NEURONTIN) 100 MG capsule Take 100 mg by mouth as needed. 05/16/20   [provider]  hydrochlorothiazide (HYDRODIURIL) 25 MG tablet TAKE 1 TABLET(25 MG) BY MOUTH DAILY 04/09/23   Nahser, Deloris Ping, MD  levothyroxine (SYNTHROID, LEVOTHROID) 75 MCG tablet Take 75 mcg by mouth daily before breakfast.    [provider]  meclizine (ANTIVERT) 12.5 MG tablet Take 12.5 mg by mouth 3 (three) times daily as needed for dizziness.    [provider]  methotrexate (RHEUMATREX) 2.5 MG tablet  Take 2.5 mg by mouth once a week. Take 8 pills by mouth weekly. 04/29/22   [provider]  Multiple Vitamin (MULTIVITAMIN) tablet Take 1 tablet by mouth daily.    [provider]  nitroGLYCERIN (NITROSTAT) 0.4 MG SL tablet Place 1 tablet (0.4 mg total) under the tongue every 5 (five) minutes as needed. 08/18/14   Nahser, Deloris Ping, MD  omeprazole (PRILOSEC) 20 MG capsule Take 20 mg by mouth daily.    [provider]  potassium chloride (KLOR-CON) 10 MEQ tablet TAKE 1 TABLET(10 MEQ) BY MOUTH TWICE DAILY 04/09/23   Nahser, Deloris Ping, MD  rosuvastatin (CRESTOR) 10 MG tablet Take 10 mg by mouth daily. 06/24/23   [provider]  warfarin (COUMADIN) 5 MG tablet TAKE 1 TABLET BY MOUTH DAILY. EXCEPT 1/2 TABLET ON MONDAY, WEDNESDAY, FRIDAY OR AS DIRECTED BY COUMADIN CLINIC 09/08/23   Nahser, Deloris Ping, MD    Physical Exam    Vital Signs:  Tammy Robbins does not have vital signs available for review today.  Given telephonic nature of communication, physical exam is limited. AAOx3. NAD. Normal affect.  Speech and respirations are unlabored.  Accessory Clinical Findings    None  Assessment & Plan    1.  Preoperative Cardiovascular Risk Assessment: According to the Revised Cardiac Risk Index (RCRI), her Perioperative Risk of Major Cardiac Event is (%): 0.4. Her Functional Capacity in METs is: 8.33 according to the Duke Activity Status Index (DASI). Therefore, based on ACC/AHA guidelines, patient would be at acceptable risk for the planned procedure without further cardiovascular testing.  The patient was advised that if she develops new symptoms prior to surgery to contact our office to arrange for a follow-up visit, and she verbalized understanding.  Per office protocol, patient can hold warfarin for 5 days prior to procedure.    A copy of this note will be routed to requesting surgeon.  Time:   Today, I have spent 7 minutes with the patient with telehealth technology  discussing medical history, symptoms, and management plan.     Denyce Robert, NP  10/09/2023, 7:26 AM

## 2023-10-22 ENCOUNTER — Ambulatory Visit: Payer: Medicare Other | Attending: Cardiovascular Disease

## 2023-10-22 DIAGNOSIS — Z5181 Encounter for therapeutic drug level monitoring: Secondary | ICD-10-CM

## 2023-10-22 LAB — POCT INR: INR: 1.6 — AB (ref 2.0–3.0)

## 2023-10-22 NOTE — Patient Instructions (Signed)
 Description   Take 1 tablet today and 1 tablet tomorrow and then continue taking 1/2 tablet daily except for 1 tablet on Tuesday and Saturday.  Stay consistent with greens each week (1 per week).  Recheck INR in 1 week.  Call if started on new meds or antibiotics #[phone].

## 2023-10-29 ENCOUNTER — Ambulatory Visit: Attending: Cardiovascular Disease

## 2023-10-29 DIAGNOSIS — Z5181 Encounter for therapeutic drug level monitoring: Secondary | ICD-10-CM | POA: Diagnosis not present

## 2023-10-29 LAB — POCT INR: INR: 2.1 (ref 2.0–3.0)

## 2023-10-29 NOTE — Patient Instructions (Signed)
 Description   Continue taking 1/2 tablet daily except for 1 tablet on Tuesday and Saturday.  Stay consistent with greens each week (1 per week).  Recheck INR in 3 weeks.  Call if started on new meds or antibiotics #[phone].

## 2023-11-07 ENCOUNTER — Telehealth: Payer: Self-pay | Admitting: *Deleted

## 2023-11-07 ENCOUNTER — Ambulatory Visit
Admission: RE | Admit: 2023-11-07 | Discharge: 2023-11-07 | Disposition: A | Discharge status: Home / Self Care | Source: Ambulatory Visit | Attending: Family Medicine | Admitting: Family Medicine

## 2023-11-07 ENCOUNTER — Other Ambulatory Visit: Payer: Self-pay

## 2023-11-07 VITALS — BP 134/85 | HR 76 | Temp 97.9°F | Resp 18

## 2023-11-07 DIAGNOSIS — N3090 Cystitis, unspecified without hematuria: Secondary | ICD-10-CM

## 2023-11-07 HISTORY — DX: Rheumatoid arthritis, unspecified: M06.9

## 2023-11-07 LAB — POCT URINALYSIS DIP (MANUAL ENTRY)
Bilirubin, UA: NEGATIVE
Glucose, UA: NEGATIVE mg/dL
Ketones, POC UA: NEGATIVE mg/dL
Nitrite, UA: NEGATIVE
Protein Ur, POC: 30 mg/dL — AB
Spec Grav, UA: 1.015 (ref 1.010–1.025)
Urobilinogen, UA: 0.2 U/dL
pH, UA: 7 (ref 5.0–8.0)

## 2023-11-07 MED ORDER — CEPHALEXIN 250 MG PO CAPS: 250.0000 mg | ORAL_CAPSULE | Freq: Three times a day (TID) | ORAL | 0 refills | Status: AC

## 2023-11-07 NOTE — ED Triage Notes (Signed)
 I have pressure in private area,  when i urine I have chills, plus pain in my lower pelvic area, also there are bubbles in the toilet . Plus urine has been cloudy .   So far no burning sensation - Entered by patient  Endorses bladder pressure and urinary frequency x 1 week. States she is getting up 4-5 times at night to use the bathroom.

## 2023-11-07 NOTE — Telephone Encounter (Signed)
 Pt called to report that she has been prescribed cephalexin 250mg  tid x 7 days advised that this is safe to take with warfarin. She verbalized understanding and was thankful for the update.

## 2023-11-07 NOTE — ED Provider Notes (Signed)
 [facility]    CSN: [id number] Arrival date & time: 11/07/23  1057      History   Chief Complaint Chief Complaint  Patient presents with   Urinary Frequency    HPI Tammy Robbins is a 78 y.o. female.    Urinary Frequency  Here for urinary pressure.  She has had some pressure in her pelvic area and sort of some pain on urination but no dysuria.  No nausea or vomiting.  She actually has had a little bit of low back pain but no flank pain.  She has had chills but no noted fever.  The symptoms have been going on for about 1 week.  She does have a history of UTIs and usually these are her typical symptoms.  Allergic to Diflucan.  Past medical history includes atrial fibrillation and she takes warfarin; she also has rheumatoid arthritis.  Past Medical History:  Diagnosis Date   Anxiety    Chest tightness    Chronic atrial fibrillation (HCC)    Diastolic dysfunction    HTN (hypertension)    Hyperlipemia    Mild aortic stenosis    Mild tricuspid regurgitation    RA (rheumatoid arthritis) (HCC)     Patient Active Problem List   Diagnosis Date Noted   Morbid obesity (HCC) 03/30/2021   Mild intermittent asthma without complication 04/12/2019   Restrictive lung disease 04/12/2019   Healthcare maintenance 04/12/2019   At risk for obstructive sleep apnea 04/12/2019   GERD (gastroesophageal reflux disease) 10/08/2016   Gout 10/08/2016   Hypothyroidism 10/08/2016   IBS (irritable bowel syndrome) 10/08/2016   Aortic valve disorder 07/29/2014   Encounter for therapeutic drug monitoring 09/08/2013   Chest tightness    Essential hypertension    Other and unspecified hyperlipidemia    Atrial fibrillation (HCC)    Mild aortic stenosis    Diastolic dysfunction    Mild tricuspid regurgitation    Anxiety state     Past Surgical History:  Procedure Laterality Date   ABDOMINAL HYSTERECTOMY     BUNIONECTOMY     CARDIAC CATHETERIZATION     NORMAL   CATARACT  EXTRACTION     CHOLECYSTECTOMY     EYE SURGERY     JOINT REPLACEMENT     PARTIAL HYSTERECTOMY     TOTAL KNEE ARTHROPLASTY     LEFT    OB History   No obstetric history on file.      Home Medications    Prior to Admission medications   Medication Sig Start Date End Date Taking? Authorizing Provider  cephALEXin (KEFLEX) 250 MG capsule Take 1 capsule (250 mg total) by mouth 3 (three) times daily for 7 days. 11/07/23 11/14/23 Yes Milta Croson, Janace Aris, MD  traMADol (ULTRAM) 50 MG tablet Take 50 mg by mouth 2 (two) times daily. 09/24/23  Yes [provider]  Acetaminophen (TYLENOL ARTHRITIS PAIN PO) Take 2 capsules by mouth every 8 (eight) hours as needed.   Yes [provider]  allopurinol (ZYLOPRIM) 300 MG tablet Take 300 mg by mouth daily.   Yes [provider]  carvedilol (COREG) 25 MG tablet TAKE 1 TABLET(25 MG) BY MOUTH TWICE DAILY WITH A MEAL 04/09/23  Yes Nahser, Deloris Ping, MD  clotrimazole (LOTRIMIN) 1 % cream Apply topically. 05/31/20  Yes [provider]  etanercept (ENBREL SURECLICK) 50 MG/ML injection Inject 50 mg into the skin once a week.   Yes [provider]  fish oil-omega-3 fatty acids 1000 MG  capsule Take by mouth daily.   Yes [provider]  folic acid (FOLVITE) 1 MG tablet Take 1 mg by mouth daily.   Yes [provider]  hydrochlorothiazide (HYDRODIURIL) 25 MG tablet TAKE 1 TABLET(25 MG) BY MOUTH DAILY 04/09/23  Yes Nahser, Deloris Ping, MD  levothyroxine (SYNTHROID, LEVOTHROID) 75 MCG tablet Take 75 mcg by mouth daily before breakfast.   Yes [provider]  meclizine (ANTIVERT) 12.5 MG tablet Take 12.5 mg by mouth 3 (three) times daily as needed for dizziness.   Yes [provider]  methotrexate (RHEUMATREX) 2.5 MG tablet Take 2.5 mg by mouth once a week. Take 8 pills by mouth weekly. 04/29/22  Yes [provider]  Multiple Vitamin (MULTIVITAMIN) tablet Take 1 tablet by mouth daily.   Yes  [provider]  nitroGLYCERIN (NITROSTAT) 0.4 MG SL tablet Place 1 tablet (0.4 mg total) under the tongue every 5 (five) minutes as needed. 08/18/14  Yes Nahser, Deloris Ping, MD  omeprazole (PRILOSEC) 20 MG capsule Take 20 mg by mouth daily.   Yes [provider]  potassium chloride (KLOR-CON) 10 MEQ tablet TAKE 1 TABLET(10 MEQ) BY MOUTH TWICE DAILY 04/09/23  Yes Nahser, Deloris Ping, MD  rosuvastatin (CRESTOR) 10 MG tablet Take 10 mg by mouth daily. 06/24/23  Yes [provider]  warfarin (COUMADIN) 5 MG tablet TAKE 1 TABLET BY MOUTH DAILY. EXCEPT 1/2 TABLET ON MONDAY, WEDNESDAY, FRIDAY OR AS DIRECTED BY COUMADIN CLINIC 09/08/23  Yes Nahser, Deloris Ping, MD    Family History Family History  Problem Relation Age of Onset   Intracerebral hemorrhage Father    Hypertension Father    Pneumonia Mother    Mental retardation Sister    Lung cancer Brother     Social History Social History   Tobacco Use   Smoking status: Never   Smokeless tobacco: Never  Vaping Use   Vaping status: Never Used  Substance Use Topics   Alcohol use: Not Currently    Comment: occasionally   Drug use: No     Allergies   Diflucan [fluconazole]   Review of Systems Review of Systems  Genitourinary:  Positive for frequency.     Physical Exam Triage Vital Signs ED Triage Vitals  Encounter Vitals Group     BP --      Systolic BP Percentile --      Diastolic BP Percentile --      Pulse Rate 11/07/23 1106 76     Resp 11/07/23 1106 18     Temp 11/07/23 1106 97.9 F (36.6 C)     Temp Source 11/07/23 1106 Oral     SpO2 11/07/23 1106 97 %     Weight --      Height --      Head Circumference --      Peak Flow --      Pain Score 11/07/23 1103 4     Pain Loc --      Pain Education --      Exclude from Growth Chart --    No data found.  Updated Vital Signs BP 134/85 (BP Location: Left Arm)   Pulse 76   Temp 97.9 F (36.6 C) (Oral)   Resp 18   SpO2 97%   Visual Acuity Right  Eye Distance:   Left Eye Distance:   Bilateral Distance:    Right Eye Near:   Left Eye Near:    Bilateral Near:     Physical Exam Vitals  reviewed.  Constitutional:      General: She is not in acute distress.    Appearance: She is not toxic-appearing.  HENT:     Mouth/Throat:     Mouth: Mucous membranes are moist.  Eyes:     Extraocular Movements: Extraocular movements intact.     Pupils: Pupils are equal, round, and reactive to light.  Cardiovascular:     Rate and Rhythm: Normal rate.  Pulmonary:     Effort: Pulmonary effort is normal. No respiratory distress.     Breath sounds: No stridor. No wheezing, rhonchi or rales.  Abdominal:     Palpations: Abdomen is soft.     Tenderness: There is no abdominal tenderness.  Musculoskeletal:     Cervical back: Neck supple.  Lymphadenopathy:     Cervical: No cervical adenopathy.  Skin:    Coloration: Skin is not jaundiced or pale.  Neurological:     General: No focal deficit present.     Mental Status: She is alert and oriented to person, place, and time.  Psychiatric:        Behavior: Behavior normal.      UC Treatments / Results  Labs (all labs ordered are listed, but only abnormal results are displayed) Labs Reviewed  POCT URINALYSIS DIP (MANUAL ENTRY) - Abnormal; Notable for the following components:      Result Value   Color, UA light yellow (*)    Clarity, UA cloudy (*)    Blood, UA moderate (*)    Protein Ur, POC =30 (*)    Leukocytes, UA Large (3+) (*)    All other components within normal limits  URINE CULTURE    EKG   Radiology No results found.  Procedures Procedures (including critical care time)  Medications Ordered in UC Medications - No data to display  Initial Impression / Assessment and Plan / UC Course  I have reviewed the triage vital signs and the nursing notes.  Pertinent labs & imaging results that were available during my care of the patient were reviewed by me and considered in my  medical decision making (see chart for details).     Urinalysis shows a large amount of white cells and a moderate amount of red blood cells.  Keflex is sent in to treat the UTI.  Urine culture is sent and we will notify her if anything looks like it needs to be changed. Final Clinical Impressions(s) / UC Diagnoses   Final diagnoses:  Cystitis     Discharge Instructions      The urinalysis shows a lot of white blood cells and red blood cells, consistent with a bladder infection.  Take cephalexin 250 mg--1 capsule 3 times daily for 7 days  Urine culture is sent, and staff will notify you that it looks like the antibiotic needs to be changed.  Make sure you are drinking enough fluids.  If you worsen in any way, please go to the emergency room for further evaluation and treatment     ED Prescriptions     Medication Sig Dispense Auth. Provider   cephALEXin (KEFLEX) 250 MG capsule Take 1 capsule (250 mg total) by mouth 3 (three) times daily for 7 days. 21 capsule Zenia Resides, MD      PDMP not reviewed this encounter.   Zenia Resides, MD 11/07/23 [phone]

## 2023-11-07 NOTE — Discharge Instructions (Signed)
 The urinalysis shows a lot of white blood cells and red blood cells, consistent with a bladder infection.  Take cephalexin 250 mg--1 capsule 3 times daily for 7 days  Urine culture is sent, and staff will notify you that it looks like the antibiotic needs to be changed.  Make sure you are drinking enough fluids.  If you worsen in any way, please go to the emergency room for further evaluation and treatment

## 2023-11-09 LAB — URINE CULTURE: Culture: >=100000 — AB

## 2023-11-12 ENCOUNTER — Ambulatory Visit: Payer: Medicare Other | Attending: Cardiology | Admitting: Cardiology

## 2023-11-12 ENCOUNTER — Encounter: Payer: Self-pay | Admitting: Cardiology

## 2023-11-12 VITALS — BP 132/76 | HR 60 | Resp 16 | Ht 64.0 in | Wt 256.7 lb

## 2023-11-12 DIAGNOSIS — G4733 Obstructive sleep apnea (adult) (pediatric): Secondary | ICD-10-CM | POA: Diagnosis not present

## 2023-11-12 DIAGNOSIS — I10 Essential (primary) hypertension: Secondary | ICD-10-CM | POA: Diagnosis not present

## 2023-11-12 NOTE — Patient Instructions (Signed)
 Medication Instructions:  Your physician recommends that you continue on your current medications as directed. Please refer to the Current Medication list given to you today.  *If you need a refill on your cardiac medications before your next appointment, please call your pharmacy*  Follow-Up: At [facility], you and your health needs are our priority.  As part of our continuing mission to provide you with exceptional heart care, our providers are all part of one team.  This team includes your primary Cardiologist (physician) and Advanced Practice Providers or APPs (Physician Assistants and Nurse Practitioners) who all work together to provide you with the care you need, when you need it.  Your next appointment:   1 year(s)  Provider:   Armanda Magic, MD  We recommend signing up for the patient portal called "MyChart".  Sign up information is provided on this After Visit Summary.  MyChart is used to connect with patients for Virtual Visits (Telemedicine).  Patients are able to view lab/test results, encounter notes, upcoming appointments, etc.  Non-urgent messages can be sent to your provider as well.   To learn more about what you can do with MyChart, go to [email or web address].   Other Instructions      1st Floor: - Lobby - Registration  - Pharmacy  - Lab - Cafe  2nd Floor: - PV Lab - Diagnostic Testing (echo, CT, nuclear med)  3rd Floor: - Vacant  4th Floor: - TCTS (cardiothoracic surgery) - AFib Clinic - Structural Heart Clinic - Vascular Surgery  - Vascular Ultrasound  5th Floor: - HeartCare Cardiology (general and EP) - Clinical Pharmacy for coumadin, hypertension, lipid, weight-loss medications, and med management appointments    Valet parking services will be available as well.

## 2023-11-12 NOTE — Progress Notes (Signed)
 Date:  11/12/2023   ID:  Tammy Robbins, DOB Dec 05, 1945, MRN [id number] The patient was identified using 2 identifiers.  PCP:  Burton Apley, MD   [facility] Providers Cardiologist:  Kristeen Miss, MD Sleep Medicine:  Armanda Magic, MD {   Evaluation Performed:  Follow-Up Visit  Chief Complaint:  OSA  History of Present Illness:    Tammy Robbins is a 78 y.o. female with a hx of AS, HTN and chronic AF who was referred for sleep study. She underwent PSG on 08/02/2019 showing severe OSA with an AHI of 60/hr with severe nocturnal hypoxemia with O2 sats as low as 70%. She underwent CPAP titration to 12cm H2O.   She is doing well with her PAP device and thinks that she has gotten used to it.  She tolerates the full face mask but sometimes has a hard time getting a good seal due to her arthritis limiting her ability to tighten the mask a lot.  She feels the pressure is adequate.  Since going on PAP she feels rested in the am and has no significant daytime sleepiness but does nap some when she has to take pain meds.  She denies any significant nasal dryness or nasal congestion.  She has some problems with mouth dryness.  She does not think that he snores.    Past Medical History:  Diagnosis Date   Anxiety    Chest tightness    Chronic atrial fibrillation (HCC)    Diastolic dysfunction    HTN (hypertension)    Hyperlipemia    Mild aortic stenosis    Mild tricuspid regurgitation    RA (rheumatoid arthritis) (HCC)    Past Surgical History:  Procedure Laterality Date   ABDOMINAL HYSTERECTOMY     BUNIONECTOMY     CARDIAC CATHETERIZATION     NORMAL   CATARACT EXTRACTION     CHOLECYSTECTOMY     EYE SURGERY     JOINT REPLACEMENT     PARTIAL HYSTERECTOMY     TOTAL KNEE ARTHROPLASTY     LEFT     Current Meds  Medication Sig   Acetaminophen (TYLENOL ARTHRITIS PAIN PO) Take 2 capsules by mouth every 8 (eight) hours as needed.   allopurinol (ZYLOPRIM) 300 MG tablet Take 300 mg  by mouth daily.   carvedilol (COREG) 25 MG tablet TAKE 1 TABLET(25 MG) BY MOUTH TWICE DAILY WITH A MEAL   cephALEXin (KEFLEX) 250 MG capsule Take 1 capsule (250 mg total) by mouth 3 (three) times daily for 7 days.   clotrimazole (LOTRIMIN) 1 % cream Apply topically.   etanercept (ENBREL SURECLICK) 50 MG/ML injection Inject 50 mg into the skin once a week.   fish oil-omega-3 fatty acids 1000 MG capsule Take by mouth daily.   folic acid (FOLVITE) 1 MG tablet Take 1 mg by mouth daily.   hydrochlorothiazide (HYDRODIURIL) 25 MG tablet TAKE 1 TABLET(25 MG) BY MOUTH DAILY   levothyroxine (SYNTHROID, LEVOTHROID) 75 MCG tablet Take 75 mcg by mouth daily before breakfast.   methotrexate (RHEUMATREX) 2.5 MG tablet Take 2.5 mg by mouth once a week. Take 8 pills by mouth weekly.   Multiple Vitamin (MULTIVITAMIN) tablet Take 1 tablet by mouth daily.   nitroGLYCERIN (NITROSTAT) 0.4 MG SL tablet Place 1 tablet (0.4 mg total) under the tongue every 5 (five) minutes as needed.   omeprazole (PRILOSEC) 20 MG capsule Take 20 mg by mouth daily.   potassium chloride (KLOR-CON) 10 MEQ tablet  TAKE 1 TABLET(10 MEQ) BY MOUTH TWICE DAILY   rosuvastatin (CRESTOR) 10 MG tablet Take 10 mg by mouth daily.   traMADol (ULTRAM) 50 MG tablet Take 50 mg by mouth 2 (two) times daily.   warfarin (COUMADIN) 5 MG tablet TAKE 1 TABLET BY MOUTH DAILY. EXCEPT 1/2 TABLET ON MONDAY, WEDNESDAY, FRIDAY OR AS DIRECTED BY COUMADIN CLINIC     Allergies:   Diflucan [fluconazole]   Social History   Tobacco Use   Smoking status: Never   Smokeless tobacco: Never  Vaping Use   Vaping status: Never Used  Substance Use Topics   Alcohol use: Not Currently    Comment: occasionally   Drug use: No     Family Hx: The patient's family history includes Hypertension in her father; Intracerebral hemorrhage in her father; Lung cancer in her brother; Mental retardation in her sister; Pneumonia in her mother.  ROS:   Please see the history of  present illness.     All other systems reviewed and are negative.   Prior Sleep studies:   The following studies were reviewed today:  PAP compliance download  Labs/Other Tests and Data Reviewed:     Recent Labs: No results found for requested labs within last 365 days.   Wt Readings from Last 3 Encounters:  11/12/23 256 lb 11.2 oz (116.4 kg)  03/25/23 265 lb 9.6 oz (120.5 kg)  11/12/22 281 lb (127.5 kg)      Objective:    Vital Signs:  BP 132/76 (BP Location: Left Arm, Patient Position: Sitting, Cuff Size: Large)   Pulse 60   Resp 16   Ht 5\' 4"  (1.626 m)   Wt 256 lb 11.2 oz (116.4 kg)   SpO2 97%   BMI 44.06 kg/m   GEN: Well nourished, well developed in no acute distress HEENT: Normal NECK: No JVD; No carotid bruits LYMPHATICS: No lymphadenopathy CARDIAC:irregularly irregular, no murmurs, rubs, gallops RESPIRATORY:  Clear to auscultation without rales, wheezing or rhonchi  ABDOMEN: Soft, non-tender, non-distended MUSCULOSKELETAL:  No edema; No deformity  SKIN: Warm and dry NEUROLOGIC:  Alert and oriented x 3 PSYCHIATRIC:  Normal affect  ASSESSMENT & PLAN:    OSA - The patient is tolerating PAP therapy well without any problems. The PAP download performed by his DME was personally reviewed and interpreted by me today and showed an AHI of 1.2 /hr on 12 cm H2O with 97% compliance in using more than 4 hours nightly.  The patient has been using and benefiting from PAP use and will continue to benefit from therapy.  -I have recommended that she try to increase her humidity in the device to help with mouth dryness  HTN -BP is adequately controlled on exam today -Continue prescription drug management with carvedilol 25 mg twice daily, HCTZ 25 mg daily with as needed refills   Medication Adjustments/Labs and Tests Ordered: Current medicines are reviewed at length with the patient today.  Concerns regarding medicines are outlined above.   Tests Ordered: No orders of  the defined types were placed in this encounter.   Medication Changes: No orders of the defined types were placed in this encounter.   Follow Up:  In Person in 1 year(s)  Signed, Armanda Magic, MD  11/12/2023 8:18 AM    [facility]

## 2023-11-19 ENCOUNTER — Ambulatory Visit: Attending: Cardiovascular Disease

## 2023-11-19 DIAGNOSIS — Z5181 Encounter for therapeutic drug level monitoring: Secondary | ICD-10-CM

## 2023-11-19 LAB — POCT INR: INR: 2.3 (ref 2.0–3.0)

## 2023-11-19 NOTE — Patient Instructions (Addendum)
 Description   Continue taking 1/2 tablet daily except for 1 tablet on Tuesday and Saturday.  Stay consistent with greens each week (1 per week).  Recheck INR in 4 weeks. Call if started on new meds or antibiotics #[phone].

## 2023-12-04 ENCOUNTER — Telehealth: Payer: Self-pay | Admitting: *Deleted

## 2023-12-04 NOTE — Telephone Encounter (Signed)
 Pt called and stated she started taking Cipro a 1 week supply on 12/03/2023. Cipro can have an affect on the INR. Scheduled pt to come in on 12/09/2023 to have INR checked.

## 2023-12-09 ENCOUNTER — Encounter

## 2023-12-09 ENCOUNTER — Ambulatory Visit: Attending: Cardiovascular Disease | Admitting: *Deleted

## 2023-12-09 DIAGNOSIS — Z5181 Encounter for therapeutic drug level monitoring: Secondary | ICD-10-CM | POA: Diagnosis not present

## 2023-12-09 DIAGNOSIS — I4891 Unspecified atrial fibrillation: Secondary | ICD-10-CM

## 2023-12-09 LAB — POCT INR: INR: 2 (ref 2.0–3.0)

## 2023-12-09 NOTE — Patient Instructions (Addendum)
 Description   Continue taking 1/2 tablet daily except for 1 tablet on Tuesday and Saturday.  Stay consistent with greens each week (1 per week).  Recheck INR in 5 weeks.  Call if started on new meds or antibiotics #[phone].

## 2023-12-16 ENCOUNTER — Encounter

## 2023-12-17 ENCOUNTER — Encounter

## 2023-12-25 ENCOUNTER — Telehealth: Payer: Self-pay | Admitting: Cardiovascular Disease

## 2023-12-25 NOTE — Telephone Encounter (Signed)
 Called pt and made her aware these 2 medication will not affect INR and she should continue taking prescribed medication as well as Warfarin. Pt verbalize understanding.

## 2023-12-25 NOTE — Telephone Encounter (Signed)
 Pt c/o medication issue:  1. Name of Medication: cephalexin  250 4x a day, benzonatate  100mg  2x a day  2. How are you currently taking this medication (dosage and times per day)?   3. Are you having a reaction (difficulty breathing--STAT)?   4. What is your medication issue? Pt wants to know if ok to take due to being on coumadin . Requesting cb

## 2024-01-13 ENCOUNTER — Ambulatory Visit: Attending: Cardiovascular Disease

## 2024-01-13 DIAGNOSIS — Z5181 Encounter for therapeutic drug level monitoring: Secondary | ICD-10-CM | POA: Diagnosis not present

## 2024-01-13 LAB — POCT INR: INR: 2.1 (ref 2.0–3.0)

## 2024-01-13 NOTE — Patient Instructions (Signed)
 Continue taking 1/2 tablet daily except for 1 tablet on Tuesday and Saturday.  Stay consistent with greens each week (1 per week).  Recheck INR in 5 weeks.  Call if started on new meds or antibiotics #[phone].

## 2024-01-22 ENCOUNTER — Other Ambulatory Visit: Payer: Self-pay

## 2024-01-22 MED ORDER — WARFARIN SODIUM 5 MG PO TABS
ORAL_TABLET | ORAL | 0 refills | Status: DC
Start: 2024-01-22 — End: 2024-05-10

## 2024-02-17 ENCOUNTER — Ambulatory Visit: Attending: Cardiovascular Disease | Admitting: *Deleted

## 2024-02-17 DIAGNOSIS — Z5181 Encounter for therapeutic drug level monitoring: Secondary | ICD-10-CM

## 2024-02-17 DIAGNOSIS — I4891 Unspecified atrial fibrillation: Secondary | ICD-10-CM

## 2024-02-17 LAB — POCT INR: INR: 1.8 — AB (ref 2.0–3.0)

## 2024-02-17 NOTE — Progress Notes (Signed)
Please see anticoagulation encounter.

## 2024-02-17 NOTE — Patient Instructions (Signed)
 Description   Today take 1.5 tablets then continue taking warfarin 1/2 tablet daily except for 1 tablet on Tuesday and Saturday.  Stay consistent with greens each week (1 per week).  Recheck INR in 5 weeks. Call if started on new meds or antibiotics #[phone].

## 2024-02-26 ENCOUNTER — Telehealth: Payer: Self-pay | Admitting: Cardiology

## 2024-02-26 NOTE — Telephone Encounter (Signed)
 New Message:    Patient says she needs sto talk to someone in the Coumadin  Clinic. I could not route this message directly to Coumadin .

## 2024-02-26 NOTE — Telephone Encounter (Signed)
 Pt states she started Ciprofloxacin 250mg  BID yesterday, 02/25/24. Made pt aware this medication does interact with Warfarin. Scheduled Coumadin  Clinic check for Monday, 03/01/24 at 2:45am.

## 2024-03-01 ENCOUNTER — Ambulatory Visit: Attending: Cardiovascular Disease

## 2024-03-01 DIAGNOSIS — Z5181 Encounter for therapeutic drug level monitoring: Secondary | ICD-10-CM

## 2024-03-01 LAB — POCT INR: INR: 2.5 (ref 2.0–3.0)

## 2024-03-01 NOTE — Progress Notes (Signed)
 INR 2.5  Please see anticoagulation encounter

## 2024-03-01 NOTE — Patient Instructions (Signed)
 continue taking warfarin 1/2 tablet daily except for 1 tablet on Tuesday and Saturday.  Stay consistent with greens each week (1 per week).  Recheck INR in 3 weeks. Call if started on new meds or antibiotics #[phone].

## 2024-03-02 ENCOUNTER — Other Ambulatory Visit: Payer: Self-pay | Admitting: Internal Medicine

## 2024-03-02 DIAGNOSIS — Z1231 Encounter for screening mammogram for malignant neoplasm of breast: Secondary | ICD-10-CM

## 2024-03-22 ENCOUNTER — Ambulatory Visit
Admission: RE | Admit: 2024-03-22 | Discharge: 2024-03-22 | Disposition: A | Discharge status: Home / Self Care | Source: Ambulatory Visit | Attending: Internal Medicine | Admitting: Internal Medicine

## 2024-03-22 DIAGNOSIS — Z1231 Encounter for screening mammogram for malignant neoplasm of breast: Secondary | ICD-10-CM

## 2024-03-25 ENCOUNTER — Ambulatory Visit: Attending: Cardiology | Admitting: *Deleted

## 2024-03-25 ENCOUNTER — Encounter: Payer: Self-pay | Admitting: Cardiology

## 2024-03-25 ENCOUNTER — Ambulatory Visit: Attending: Cardiology | Admitting: Cardiology

## 2024-03-25 VITALS — BP 120/70 | HR 85 | Resp 16 | Ht 64.0 in | Wt 257.0 lb

## 2024-03-25 DIAGNOSIS — I4821 Permanent atrial fibrillation: Secondary | ICD-10-CM

## 2024-03-25 DIAGNOSIS — E782 Mixed hyperlipidemia: Secondary | ICD-10-CM

## 2024-03-25 DIAGNOSIS — Z5181 Encounter for therapeutic drug level monitoring: Secondary | ICD-10-CM

## 2024-03-25 DIAGNOSIS — G4733 Obstructive sleep apnea (adult) (pediatric): Secondary | ICD-10-CM

## 2024-03-25 DIAGNOSIS — I10 Essential (primary) hypertension: Secondary | ICD-10-CM

## 2024-03-25 DIAGNOSIS — I350 Nonrheumatic aortic (valve) stenosis: Secondary | ICD-10-CM

## 2024-03-25 LAB — POCT INR: POC INR: 2.1

## 2024-03-25 NOTE — Progress Notes (Signed)
 Cardiology Office Note:  .   Date:  03/27/2024  ID:  Tammy Robbins, DOB [birth year], MRN [id number] PCP:  Henry Ingle, MD  Former Cardiology Providers: DR. ALEENE PASSE [facility] Providers Cardiologist:  Madonna Large, DO Sleep Medicine:  Wilbert Bihari, MD , [facility] (established care 03/25/24) Electrophysiologist:  None  Click to update primary MD,subspecialty MD or APP then REFRESH:1}    Chief Complaint  Patient presents with   Follow-up    Afib and Aortic valve disease    History of Present Illness: .   Tammy Robbins is a 78 y.o. Caucasian female whose past medical history and cardiovascular risk factors includes: Hypertension, hyperlipidemia, atrial fibrillation, hypothyroidism, aortic stenosis, OSA in adult.  Formally under the care of Dr. PASSE who last saw Tammy Robbins back in 03/25/2023. I am seeing her for the first time to re-establishing care.   Patient being followed by the practice given her history of atrial fibrillation and aortic stenosis.  Atrial fibrillation and anticoagulation - Diagnosed in 2011 - Generally asymptomatic with no noticeable symptoms related to atrial fibrillation - On Coumadin for anticoagulation to prevent stroke - No history of bleeding complications while on anticoagulation - No prior procedures or medications for rhythm conversion - No history of stroke, chest pain, shortness of breath, dizziness, or syncope    Review of Systems: .   Review of Systems  Cardiovascular:  Negative for chest pain, claudication, irregular heartbeat, leg swelling, near-syncope, orthopnea, palpitations, paroxysmal nocturnal dyspnea and syncope.  Respiratory:  Negative for shortness of breath.   Hematologic/Lymphatic: Negative for bleeding problem.    Studies Reviewed:   EKG: EKG Interpretation Date/Time:  Thursday March 25 2024 08:07:39 EDT Ventricular Rate:  74 PR Interval:    QRS Duration:  70 QT Interval:  400 QTC Calculation: 444 R  Axis:   53  Text Interpretation: Atrial fibrillation CONTROLLED VENTRICULAR RESPONSE Low voltage QRS When compared with ECG of 25-Mar-2023 08:17, No significant change since last tracing Confirmed by Large Madonna ([id number]) on 03/25/2024 8:16:51 AM  Echocardiogram: 11/2008: Results not available for review.   RADIOLOGY: NA  Risk Assessment/Calculations:   Click Here to Calculate/Change CHADS2VASc Score The patient's CHADS2-VASc score is 4, indicating a 4.8% annual risk of stroke.  CHF History: No HTN History: Yes Diabetes History: No Stroke History: No Vascular Disease History: No  Labs:    External Labs: Collected: 11/25/2023. Hemoglobin 11.6. Serum creatinine 0.74. Platelets 240K      Physical Exam:    Today's Vitals   03/25/24 0754  BP: 120/70  Pulse: 85  Resp: 16  SpO2: 97%  Weight: 257 lb (116.6 kg)  Height: 5' 4 (1.626 m)   Body mass index is 44.11 kg/m. Wt Readings from Last 3 Encounters:  03/25/24 257 lb (116.6 kg)  11/12/23 256 lb 11.2 oz (116.4 kg)  03/25/23 265 lb 9.6 oz (120.5 kg)    Physical Exam  Constitutional: No distress.  hemodynamically stable  Neck: No JVD present.  Cardiovascular: Normal rate, regular rhythm, S1 normal and S2 normal. Exam reveals no gallop, no S3 and no S4.  Murmur heard. Systolic murmur is present with a grade of 2/6 at the upper right sternal border. Pulmonary/Chest: Effort normal and breath sounds normal. No stridor. She has no wheezes. She has no rales.  Musculoskeletal:        General: No edema.     Cervical back: Neck supple.  Skin: Skin is warm.   Impression & Recommendation(s):  Impression:   ICD-10-CM   1. Permanent atrial fibrillation (HCC)  I48.21     2. Essential hypertension  I10 EKG 12-Lead    3. Nonrheumatic aortic valve stenosis  I35.0     4. Mixed hyperlipidemia  E78.2     5. OSA (obstructive sleep apnea)  G47.33        Recommendation(s):  Permanent atrial fibrillation (HCC) Long-term  oral anticoagulation Presumed to be permanent, EKGs illustrates atrial fibrillation since 2021 Rate control: Carvedilol  25 mg p.o. twice daily. Rhythm control: N/A. Thromboembolic prophylaxis: Coumadin . Continues to follow-up with the Coumadin  clinic. EKG illustrates with rate controlled atrial fibrillation. Patient states that she gets her renal function and blood work checked every 6 months with PCP/rheumatology  Essential hypertension Office blood pressures are well-controlled. Continue hydrochlorothiazide  25 mg p.o. daily  Nonrheumatic aortic valve stenosis Carries a history  of aortic stenosis. Last echo was in 2010; however, unable to review the report or images at this time. No significant cardiac murmur on physical examination. Echo will be ordered to evaluate for structural heart disease and left ventricular systolic function.  Mixed hyperlipidemia Currently on Crestor 10 mg p.o. nightly. Cardiology following peripherally, managed by primary care provider.  OSA (obstructive sleep apnea) Utilizes CPAP on a regular basis.   Orders Placed:  Orders Placed This Encounter  Procedures   EKG 12-Lead     Final Medication List:   No orders of the defined types were placed in this encounter.   There are no discontinued medications.   Current Outpatient Medications:    Acetaminophen (TYLENOL ARTHRITIS PAIN PO), Take 2 capsules by mouth every 8 (eight) hours as needed., Disp: , Rfl:    allopurinol (ZYLOPRIM) 300 MG tablet, Take 300 mg by mouth daily., Disp: , Rfl:    carvedilol  (COREG ) 25 MG tablet, TAKE 1 TABLET(25 MG) BY MOUTH TWICE DAILY WITH A MEAL, Disp: 180 tablet, Rfl: 3   clotrimazole (LOTRIMIN) 1 % cream, Apply topically., Disp: , Rfl:    etanercept (ENBREL SURECLICK) 50 MG/ML injection, Inject 50 mg into the skin once a week., Disp: , Rfl:    fish oil-omega-3 fatty acids 1000 MG capsule, Take by mouth daily., Disp: , Rfl:    folic acid (FOLVITE) 1 MG tablet, Take 1  mg by mouth daily., Disp: , Rfl:    hydrochlorothiazide  (HYDRODIURIL ) 25 MG tablet, TAKE 1 TABLET(25 MG) BY MOUTH DAILY, Disp: 90 tablet, Rfl: 3   levothyroxine (SYNTHROID, LEVOTHROID) 75 MCG tablet, Take 75 mcg by mouth daily before breakfast., Disp: , Rfl:    meclizine (ANTIVERT) 12.5 MG tablet, Take 12.5 mg by mouth 3 (three) times daily as needed for dizziness., Disp: , Rfl:    methotrexate (RHEUMATREX) 2.5 MG tablet, Take 2.5 mg by mouth once a week. Take 8 pills by mouth weekly., Disp: , Rfl:    Multiple Vitamin (MULTIVITAMIN) tablet, Take 1 tablet by mouth daily., Disp: , Rfl:    nitroGLYCERIN  (NITROSTAT ) 0.4 MG SL tablet, Place 1 tablet (0.4 mg total) under the tongue every 5 (five) minutes as needed., Disp: 25 tablet, Rfl: 6   omeprazole (PRILOSEC) 20 MG capsule, Take 20 mg by mouth daily., Disp: , Rfl:    potassium chloride  (KLOR-CON ) 10 MEQ tablet, TAKE 1 TABLET(10 MEQ) BY MOUTH TWICE DAILY, Disp: 180 tablet, Rfl: 3   rosuvastatin (CRESTOR) 10 MG tablet, Take 10 mg by mouth daily., Disp: , Rfl:    traMADol  (ULTRAM ) 50 MG tablet, Take 50 mg by mouth 2 (two)  times daily., Disp: , Rfl:    warfarin (COUMADIN) 5 MG tablet, TAKE 1 TABLET BY MOUTH DAILY. EXCEPT 1/2 TABLET ON MONDAY, WEDNESDAY, FRIDAY OR AS DIRECTED BY COUMADIN CLINIC, Disp: 90 tablet, Rfl: 0  Consent:   NA  Disposition:   1 year follow-up sooner if needed  Her questions and concerns were addressed to her satisfaction. She voices understanding of the recommendations provided during this encounter.    Signed, Madonna Michele HAS, [facility]  A Division of [facility] [address] [place], [place] [address]  [place], [place] [address] 03/27/2024 8:53 AM

## 2024-03-25 NOTE — Patient Instructions (Signed)
 Medication Instructions:  No medication changes were made at this visit. Continue current regimen.   *If you need a refill on your cardiac medications before your next appointment, please call your pharmacy*  Lab Work: None ordered today. If you have labs (blood work) drawn today and your tests are completely normal, you will receive your results only by: MyChart Message (if you have MyChart) OR A paper copy in the mail If you have any lab test that is abnormal or we need to change your treatment, we will call you to review the results.  Testing/Procedures: None ordered today.  Follow-Up: At [facility], you and your health needs are our priority.  As part of our continuing mission to provide you with exceptional heart care, our providers are all part of one team.  This team includes your primary Cardiologist (physician) and Advanced Practice Providers or APPs (Physician Assistants and Nurse Practitioners) who all work together to provide you with the care you need, when you need it.  Your next appointment:   1 year(s)  Provider:   Madonna Large, DO

## 2024-03-25 NOTE — Patient Instructions (Signed)
 Description   continue taking warfarin 1/2 tablet daily except for 1 tablet on Tuesday and Saturday.  Stay consistent with greens each week (1 per week).  Recheck INR in 4 weeks. Call if started on new meds or antibiotics #[phone].

## 2024-03-25 NOTE — Progress Notes (Signed)
 INR 2.1; Please see anticoagulation encounter

## 2024-03-29 ENCOUNTER — Other Ambulatory Visit: Payer: Self-pay | Admitting: Cardiology

## 2024-03-29 MED ORDER — POTASSIUM CHLORIDE ER 10 MEQ PO TBCR: 10.0000 meq | EXTENDED_RELEASE_TABLET | Freq: Two times a day (BID) | ORAL | 3 refills | Status: AC

## 2024-03-29 MED ORDER — HYDROCHLOROTHIAZIDE 25 MG PO TABS: 25.0000 mg | ORAL_TABLET | Freq: Every day | ORAL | 3 refills | Status: DC

## 2024-03-29 MED ORDER — CARVEDILOL 25 MG PO TABS: 25.0000 mg | ORAL_TABLET | Freq: Two times a day (BID) | ORAL | 3 refills | Status: AC

## 2024-04-01 ENCOUNTER — Ambulatory Visit (HOSPITAL_COMMUNITY)

## 2024-04-01 ENCOUNTER — Ambulatory Visit: Payer: Self-pay | Admitting: Cardiology

## 2024-04-01 ENCOUNTER — Ambulatory Visit (HOSPITAL_COMMUNITY)
Admission: RE | Admit: 2024-04-01 | Discharge: 2024-04-01 | Disposition: A | Discharge status: Home / Self Care | Source: Ambulatory Visit | Attending: Cardiology | Admitting: Cardiology

## 2024-04-01 DIAGNOSIS — I350 Nonrheumatic aortic (valve) stenosis: Secondary | ICD-10-CM | POA: Insufficient documentation

## 2024-04-01 DIAGNOSIS — I359 Nonrheumatic aortic valve disorder, unspecified: Secondary | ICD-10-CM | POA: Diagnosis not present

## 2024-04-01 LAB — ECHOCARDIOGRAM COMPLETE
Area-P 1/2: 5.51 cm2
P 1/2 time: 445 ms
S' Lateral: 3.1 cm

## 2024-04-01 MED ORDER — PERFLUTREN LIPID MICROSPHERE
1.0000 mL | INTRAVENOUS | Status: AC | PRN
  Administered 2024-04-01: 3 mL via INTRAVENOUS

## 2024-04-07 ENCOUNTER — Encounter: Payer: Self-pay | Admitting: Cardiology

## 2024-04-07 ENCOUNTER — Ambulatory Visit: Attending: Cardiology | Admitting: Cardiology

## 2024-04-07 VITALS — BP 131/79 | HR 70 | Resp 16 | Ht 64.0 in | Wt 256.2 lb

## 2024-04-07 DIAGNOSIS — E782 Mixed hyperlipidemia: Secondary | ICD-10-CM | POA: Diagnosis not present

## 2024-04-07 DIAGNOSIS — G4733 Obstructive sleep apnea (adult) (pediatric): Secondary | ICD-10-CM

## 2024-04-07 DIAGNOSIS — I350 Nonrheumatic aortic (valve) stenosis: Secondary | ICD-10-CM | POA: Diagnosis not present

## 2024-04-07 DIAGNOSIS — R0602 Shortness of breath: Secondary | ICD-10-CM

## 2024-04-07 DIAGNOSIS — I4821 Permanent atrial fibrillation: Secondary | ICD-10-CM

## 2024-04-07 DIAGNOSIS — I10 Essential (primary) hypertension: Secondary | ICD-10-CM | POA: Diagnosis not present

## 2024-04-07 MED ORDER — FUROSEMIDE 20 MG PO TABS
20.0000 mg | ORAL_TABLET | Freq: Every morning | ORAL | 3 refills | Status: AC
Start: 2024-04-07 — End: ?

## 2024-04-07 MED ORDER — LOSARTAN POTASSIUM 50 MG PO TABS: 50.0000 mg | ORAL_TABLET | Freq: Every evening | ORAL | 3 refills | Status: AC

## 2024-04-07 NOTE — Patient Instructions (Addendum)
 Medication Instructions:  STOP Hydrochlorothiazide    START Losartan  50 mg daily at night  START Lasix  20 mg daily in the morning *If you need a refill on your cardiac medications before your next appointment, please call your pharmacy*  Lab Work: BMP AND BNP in one week If you have labs (blood work) drawn today and your tests are completely normal, you will receive your results only by: MyChart Message (if you have MyChart) OR A paper copy in the mail If you have any lab test that is abnormal or we need to change your treatment, we will call you to review the results.  Testing/Procedures: LIMITED ECHOCARDIOGRAM IN 3 MONTHS- NEEDS BEFORE NEXT APPOINTMENT WITH DR. MICHELE Your physician has requested that you have an echocardiogram. Echocardiography is a painless test that uses sound waves to create images of your heart. It provides your doctor with information about the size and shape of your heart and how well your heart's chambers and valves are working. This procedure takes approximately one hour. There are no restrictions for this procedure. Please do NOT wear cologne, perfume, aftershave, or lotions (deodorant is allowed). Please arrive 15 minutes prior to your appointment time.  Please note: We ask at that you not bring children with you during ultrasound (echo/ vascular) testing. Due to room size and safety concerns, children are not allowed in the ultrasound rooms during exams. Our front office staff cannot provide observation of children in our lobby area while testing is being conducted. An adult accompanying a patient to their appointment will only be allowed in the ultrasound room at the discretion of the ultrasound technician under special circumstances. We apologize for any inconvenience.   Follow-Up: At [facility], you and your health needs are our priority.  As part of our continuing mission to provide you with exceptional heart care, our providers are all part of one  team.  This team includes your primary Cardiologist (physician) and Advanced Practice Providers or APPs (Physician Assistants and Nurse Practitioners) who all work together to provide you with the care you need, when you need it.  Your next appointment:   3 MONTHS AFTER ECHO  Provider:   Madonna MICHELE, DO    We recommend signing up for the patient portal called MyChart.  Sign up information is provided on this After Visit Summary.  MyChart is used to connect with patients for Virtual Visits (Telemedicine).  Patients are able to view lab/test results, encounter notes, upcoming appointments, etc.  Non-urgent messages can be sent to your provider as well.   To learn more about what you can do with MyChart, go to [email or web address].

## 2024-04-07 NOTE — Progress Notes (Signed)
 Cardiology Office Note:  .   Date:  04/07/2024  ID:  Tammy Robbins, DOB [birth year], MRN [id number] PCP:  Henry Ingle, MD  Former Cardiology Providers: DR. ALEENE PASSE [facility] Providers Cardiologist:  Madonna Large, DO Sleep Medicine:  Wilbert Bihari, MD , [facility] (established care 03/25/24) Electrophysiologist:  None  Click to update primary MD,subspecialty MD or APP then REFRESH:1}    Chief Complaint  Patient presents with   Follow-up    Review echocardiogram results.  Shortness of breath    History of Present Illness: .   Tammy Robbins is a 78 y.o. Caucasian female whose past medical history and cardiovascular risk factors includes: Hypertension, hyperlipidemia, atrial fibrillation, hypothyroidism, aortic stenosis, OSA in adult.  Formally under the care of Dr. PASSE who last saw [name] back in 03/25/2023.  Patient is being followed by the practice given her history of atrial fibrillation.  Atrial fibrillation and anticoagulation - Diagnosed in 2011 - Generally asymptomatic with no noticeable symptoms related to atrial fibrillation - On Coumadin  for anticoagulation to prevent stroke - No history of bleeding complications while on anticoagulation - No prior procedures or medications for rhythm conversion  At the last office visit patient was recommended to have an echocardiogram to reestablish LVEF and reevaluate for valvular heart disease.  Patient's LVEF was reported to be within normal limits and she does have valvular heart disease.  However patient was noted to have newly discovered elevated right-sided pulmonary pressures up to 60 mmHg.  Patient was brought into the office sooner than her scheduled visit to discuss the echocardiographic findings and to reevaluate symptoms.  Patient is accompanied by her daughter today.  Patient states that she does have shortness of breath predominantly with effort related activities such as going up the stairs.  She has been  attributing this to obesity and has not seek medical attention.  She also has lower extremity swelling for which she wears compression stockings and is on hydrochlorothiazide .  She denies orthopnea and PND.  Her overall sleep hygiene has improved since initiation of CPAP.  Review of Systems: .   Review of Systems  Cardiovascular:  Negative for chest pain, claudication, irregular heartbeat, leg swelling, near-syncope, orthopnea, palpitations, paroxysmal nocturnal dyspnea and syncope.  Respiratory:  Negative for shortness of breath.   Hematologic/Lymphatic: Negative for bleeding problem.    Studies Reviewed:   Echocardiogram: 11/2008: Results not available for review.   04/01/2024 1. Left ventricular ejection fraction, by estimation, is 65 to 70%. The left ventricle has normal function. The left ventricle has no regional wall motion abnormalities. Left ventricular diastolic function could not be evaluated. Elevated left atrial pressure. 2. Right ventricular systolic function is normal. The right ventricular size is normal. There is severely elevated pulmonary artery systolic pressure. The estimated right ventricular systolic pressure is 60.4 mmHg. 3. Left atrial size was severely dilated. 4. Right atrial size was moderately dilated. 5. The mitral valve is normal in structure. No evidence of mitral valve regurgitation. No evidence of mitral stenosis. 6. Tricuspid valve regurgitation is moderate. 7. The aortic valve is tricuspid. There is mild calcification of the aortic valve. Aortic valve regurgitation is mild to moderate. Aortic valve sclerosis/calcification is present, without any evidence of aortic stenosis. 8. The inferior vena cava is normal in size with <50% respiratory variability, suggesting right atrial pressure of 8 mmHg.   RADIOLOGY: NA  Risk Assessment/Calculations:   Click Here to Calculate/Change CHADS2VASc Score The patient's CHADS2-VASc score is 4,  indicating a 4.8% annual  risk of stroke.  CHF History: No HTN History: Yes Diabetes History: No Stroke History: No Vascular Disease History: No  Labs:    External Labs: Collected: 11/25/2023. Hemoglobin 11.6. Serum creatinine 0.74. Platelets 240K      Physical Exam:    Today's Vitals   04/07/24 1617  BP: 131/79  Pulse: 70  Resp: 16  SpO2: 98%  Weight: 256 lb 3.2 oz (116.2 kg)  Height: 5' 4 (1.626 m)   Body mass index is 43.98 kg/m. Wt Readings from Last 3 Encounters:  04/07/24 256 lb 3.2 oz (116.2 kg)  03/25/24 257 lb (116.6 kg)  11/12/23 256 lb 11.2 oz (116.4 kg)    Physical Exam  Constitutional: No distress.  hemodynamically stable  Neck: No JVD present.  Cardiovascular: Normal rate, regular rhythm, S1 normal and S2 normal. Exam reveals no gallop, no S3 and no S4.  Murmur heard. Systolic murmur is present with a grade of 3/6 at the lower left sternal border. Pulmonary/Chest: Effort normal and breath sounds normal. No stridor. She has no wheezes. She has no rales.  Musculoskeletal:        General: No edema.     Cervical back: Neck supple.  Skin: Skin is warm.   Impression & Recommendation(s):  Impression:   ICD-10-CM   1. Permanent atrial fibrillation (HCC)  I48.21     2. Essential hypertension  I10 losartan  (COZAAR ) 50 MG tablet    furosemide  (LASIX ) 20 MG tablet    Brain natriuretic peptide    Basic metabolic panel with GFR    Basic metabolic panel with GFR    Brain natriuretic peptide    3. Nonrheumatic aortic valve stenosis  I35.0     4. Mixed hyperlipidemia  E78.2     5. OSA (obstructive sleep apnea)  G47.33     6. Shortness of breath  R06.02 ECHOCARDIOGRAM LIMITED       Recommendation(s):  Permanent atrial fibrillation (HCC) Long-term oral anticoagulation Presumed to be permanent, EKGs illustrates atrial fibrillation since 2021 Rate control: Carvedilol  25 mg p.o. twice daily. Rhythm control: N/A. Thromboembolic prophylaxis: Coumadin . Continues to follow-up  with the Coumadin  clinic. Patient states that she gets her renal function and blood work checked every 6 months with PCP/rheumatology  Shortness of breath Chronic. Multifactorial. Recent echocardiogram notes valvular heart disease and RVSP of 60.4 mmHg.  In addition, biatrial enlargement and estimated RAP of 8 mmHg. Will uptitrate diuretic therapy and better afterload reduction and to reevaluate severity of TR and RVSP. Discontinue hydrochlorothiazide  Start losartan  50 mg p.o. every afternoon Start Lasix  20 mg p.o. every morning NT proBNP and BMP in one week to check renal function and electrolytes Limited echocardiogram in 3 months to reevaluate for pulmonary hypertension and tricuspid regurgitation If despite facilitating diuresis right-sided pressures do not improve we discussed considering right heart catheterization to evaluate for pulmonary arterial hypertension.  Patient and daughter are agreeable with the plan of care.  Essential hypertension Office blood pressures are well-controlled. Medication changes as discussed above  Mixed hyperlipidemia Currently on Crestor 10 mg p.o. nightly. Cardiology following peripherally, managed by primary care provider.  OSA (obstructive sleep apnea) Utilizes CPAP on a regular basis.  Orders Placed:  Orders Placed This Encounter  Procedures   Brain natriuretic peptide    Standing Status:   Future    Number of Occurrences:   1    Expected Date:   04/14/2024    Expiration Date:   04/07/2025  Basic metabolic panel with GFR    Standing Status:   Future    Number of Occurrences:   1    Expected Date:   04/14/2024    Expiration Date:   04/07/2025   ECHOCARDIOGRAM LIMITED    Standing Status:   Future    Expected Date:   07/08/2024    Expiration Date:   04/07/2025    Where should this test be performed[facility]    Does the patient weigh less than or greater than 250 lbs?:   Patient weighs greater than 250 lbs    Perflutren   DEFINITY  (image enhancing agent) should be administered unless hypersensitivity or allergy exist:   Administer Perflutren     Reason for exam-Echo:   Pulmonary hypertension I27.2     Final Medication List:    Meds ordered this encounter  Medications   losartan  (COZAAR ) 50 MG tablet    Sig: Take 1 tablet (50 mg total) by mouth at bedtime.    Dispense:  90 tablet    Refill:  3   furosemide  (LASIX ) 20 MG tablet    Sig: Take 1 tablet (20 mg total) by mouth in the morning.    Dispense:  90 tablet    Refill:  3    Medications Discontinued During This Encounter  Medication Reason   hydrochlorothiazide  (HYDRODIURIL ) 25 MG tablet Discontinued by provider     Current Outpatient Medications:    Acetaminophen (TYLENOL ARTHRITIS PAIN PO), Take 2 capsules by mouth every 8 (eight) hours as needed., Disp: , Rfl:    allopurinol (ZYLOPRIM) 300 MG tablet, Take 300 mg by mouth daily., Disp: , Rfl:    carvedilol  (COREG ) 25 MG tablet, Take 1 tablet (25 mg total) by mouth 2 (two) times daily with a meal., Disp: 180 tablet, Rfl: 3   clotrimazole (LOTRIMIN) 1 % cream, Apply topically., Disp: , Rfl:    etanercept (ENBREL SURECLICK) 50 MG/ML injection, Inject 50 mg into the skin once a week., Disp: , Rfl:    fish oil-omega-3 fatty acids 1000 MG capsule, Take by mouth daily., Disp: , Rfl:    folic acid (FOLVITE) 1 MG tablet, Take 1 mg by mouth daily., Disp: , Rfl:    furosemide  (LASIX ) 20 MG tablet, Take 1 tablet (20 mg total) by mouth in the morning., Disp: 90 tablet, Rfl: 3   levothyroxine (SYNTHROID, LEVOTHROID) 75 MCG tablet, Take 75 mcg by mouth daily before breakfast., Disp: , Rfl:    losartan  (COZAAR ) 50 MG tablet, Take 1 tablet (50 mg total) by mouth at bedtime., Disp: 90 tablet, Rfl: 3   meclizine (ANTIVERT) 12.5 MG tablet, Take 12.5 mg by mouth 3 (three) times daily as needed for dizziness., Disp: , Rfl:    methotrexate (RHEUMATREX) 2.5 MG tablet, Take 2.5 mg by mouth once a week. Take 8 pills by  mouth weekly., Disp: , Rfl:    Multiple Vitamin (MULTIVITAMIN) tablet, Take 1 tablet by mouth daily., Disp: , Rfl:    nitroGLYCERIN  (NITROSTAT ) 0.4 MG SL tablet, Place 1 tablet (0.4 mg total) under the tongue every 5 (five) minutes as needed., Disp: 25 tablet, Rfl: 6   omeprazole (PRILOSEC) 20 MG capsule, Take 20 mg by mouth daily., Disp: , Rfl:    potassium chloride  (KLOR-CON ) 10 MEQ tablet, Take 1 tablet (10 mEq total) by mouth 2 (two) times daily., Disp: 180 tablet, Rfl: 3   rosuvastatin (CRESTOR) 10 MG tablet, Take 10 mg by mouth daily., Disp: ,  Rfl:    traMADol  (ULTRAM ) 50 MG tablet, Take 50 mg by mouth 2 (two) times daily., Disp: , Rfl:    warfarin (COUMADIN ) 5 MG tablet, TAKE 1 TABLET BY MOUTH DAILY. EXCEPT 1/2 TABLET ON MONDAY, WEDNESDAY, FRIDAY OR AS DIRECTED BY COUMADIN  CLINIC, Disp: 90 tablet, Rfl: 0  Consent:   NA  Disposition:   3 month follow up.   Her questions and concerns were addressed to her satisfaction. She voices understanding of the recommendations provided during this encounter.    Signed, Madonna Michele HAS, [facility]  A Division of [facility] [address] [place], [place] [address]  [place], [place] [address] 04/07/2024 5:25 PM

## 2024-04-17 ENCOUNTER — Ambulatory Visit: Payer: Self-pay | Admitting: Cardiology

## 2024-04-17 LAB — BASIC METABOLIC PANEL WITH GFR
BUN/Creatinine Ratio: 24 (ref 12–28)
BUN: 21 mg/dL (ref 8–27)
CO2: 23 mmol/L (ref 20–29)
Calcium: 9.3 mg/dL (ref 8.7–10.3)
Chloride: 102 mmol/L (ref 96–106)
Creatinine, Ser: 0.86 mg/dL (ref 0.57–1.00)
Glucose: 96 mg/dL (ref 70–99)
Potassium: 4 mmol/L (ref 3.5–5.2)
Sodium: 141 mmol/L (ref 134–144)
eGFR: 70 mL/min/1.73 (ref >59)

## 2024-04-17 LAB — BRAIN NATRIURETIC PEPTIDE: BNP: 160.4 pg/mL — ABNORMAL HIGH (ref 0.0–100.0)

## 2024-04-22 ENCOUNTER — Ambulatory Visit: Attending: Cardiology

## 2024-04-22 DIAGNOSIS — Z5181 Encounter for therapeutic drug level monitoring: Secondary | ICD-10-CM

## 2024-04-22 LAB — POCT INR: INR: 1.9 — AB (ref 2.0–3.0)

## 2024-04-22 NOTE — Patient Instructions (Signed)
 Take 1 tablet today only then continue taking warfarin 1/2 tablet daily except for 1 tablet on Tuesday and Saturday.  Stay consistent with greens each week (1 per week).  Recheck INR in 4 weeks. Call if started on new meds or antibiotics #[phone].

## 2024-04-22 NOTE — Progress Notes (Signed)
 INR 1.9 Please see anticoagulation encounter Take 1 tablet today only then continue taking warfarin 1/2 tablet daily except for 1 tablet on Tuesday and Saturday.  Stay consistent with greens each week (1 per week).  Recheck INR in 4 weeks. Call if started on new meds or antibiotics #[phone].

## 2024-05-05 ENCOUNTER — Other Ambulatory Visit (HOSPITAL_COMMUNITY)

## 2024-05-07 ENCOUNTER — Telehealth: Payer: Self-pay | Admitting: *Deleted

## 2024-05-07 NOTE — Telephone Encounter (Signed)
 Patient left a voicemail to report she will be starting cipro 250mg  twice a day; which can interact with warfarin.   Advised patient to take 1/2 tablet of warfarin until Monday when we will check INR and she verbalized understanding.

## 2024-05-10 ENCOUNTER — Ambulatory Visit: Attending: Internal Medicine | Admitting: *Deleted

## 2024-05-10 ENCOUNTER — Other Ambulatory Visit: Payer: Self-pay | Admitting: Pharmacist

## 2024-05-10 DIAGNOSIS — Z5181 Encounter for therapeutic drug level monitoring: Secondary | ICD-10-CM | POA: Diagnosis not present

## 2024-05-10 DIAGNOSIS — I4891 Unspecified atrial fibrillation: Secondary | ICD-10-CM

## 2024-05-10 LAB — POCT INR: INR: 1.5 — AB (ref 2.0–3.0)

## 2024-05-10 MED ORDER — WARFARIN SODIUM 5 MG PO TABS: ORAL_TABLET | ORAL | 0 refills | Status: AC

## 2024-05-10 NOTE — Patient Instructions (Signed)
 Description   INR-1.5; Take 1 tablet today only then continue taking warfarin 1/2 tablet daily except for 1 tablet on Tuesday and Saturday.  Stay consistent with greens each week (1 per week).  Recheck INR in 1 week. Call if started on new meds or antibiotics #[phone].

## 2024-05-10 NOTE — Progress Notes (Signed)
 Description   INR-1.5; Take 1 tablet today only then continue taking warfarin 1/2 tablet daily except for 1 tablet on Tuesday and Saturday.  Stay consistent with greens each week (1 per week).  Recheck INR in 1 week. Call if started on new meds or antibiotics #[phone].

## 2024-05-19 ENCOUNTER — Ambulatory Visit: Attending: Cardiology | Admitting: Pharmacist

## 2024-05-19 DIAGNOSIS — Z5181 Encounter for therapeutic drug level monitoring: Secondary | ICD-10-CM

## 2024-05-19 DIAGNOSIS — I4891 Unspecified atrial fibrillation: Secondary | ICD-10-CM

## 2024-05-19 LAB — POCT INR: INR: 2.1 (ref 2.0–3.0)

## 2024-05-19 NOTE — Progress Notes (Signed)
 Description   INR-2.1; Continue taking warfarin 1/2 tablet daily except for 1 tablet on Tuesday and Saturday.  Stay consistent with greens each week (1 per week).  Recheck INR in 3 weeks. Call if started on new meds or antibiotics #[phone].

## 2024-05-19 NOTE — Patient Instructions (Signed)
 Description   INR-2.1; Continue taking warfarin 1/2 tablet daily except for 1 tablet on Tuesday and Saturday.  Stay consistent with greens each week (1 per week).  Recheck INR in 3 weeks. Call if started on new meds or antibiotics #[phone].

## 2024-05-20 ENCOUNTER — Encounter

## 2024-05-26 ENCOUNTER — Ambulatory Visit

## 2024-06-03 ENCOUNTER — Telehealth: Payer: Self-pay | Admitting: *Deleted

## 2024-06-03 NOTE — Telephone Encounter (Signed)
 Pt called and stated that she was started on Cipro 250mg  BID on 06/03/2024. Informed pt that Cipro could effect her INR. Pt is going to the beach and will not be back until Wednesday 10/29. Instructed for pt to eat 1 extra serving of greens this week and to take 1/2 a tablet of warfarin (2.5mg ) on Saturday 10/25 instead of 1 tablet ( 5mg ). Pt verbalized understanding. Scheduled pt to come in on 10/30 to have INR checked.

## 2024-06-09 ENCOUNTER — Encounter

## 2024-06-10 ENCOUNTER — Ambulatory Visit: Attending: Student in an Organized Health Care Education/Training Program | Admitting: *Deleted

## 2024-06-10 DIAGNOSIS — Z5181 Encounter for therapeutic drug level monitoring: Secondary | ICD-10-CM

## 2024-06-10 LAB — POCT INR: POC INR: 1.8

## 2024-06-10 NOTE — Patient Instructions (Signed)
 Description   INR-1.8; Take 1 tablet of warfarin today and then continue taking warfarin 1/2 tablet daily except for 1 tablet on Tuesday and Saturday.  Stay consistent with greens each week (1 per week).  Recheck INR in 3 weeks. Call if started on new meds or antibiotics #[phone].

## 2024-06-10 NOTE — Progress Notes (Signed)
 Lab Results  Component Value Date   INR 1.8 06/10/2024   INR 2.1 05/19/2024   INR 1.5 (A) 05/10/2024    Description   INR-1.8; Take 1 tablet of warfarin today and then continue taking warfarin 1/2 tablet daily except for 1 tablet on Tuesday and Saturday.  Stay consistent with greens each week (1 per week).  Recheck INR in 3 weeks. Call if started on new meds or antibiotics #[phone].

## 2024-06-16 ENCOUNTER — Ambulatory Visit

## 2024-06-21 ENCOUNTER — Telehealth: Payer: Self-pay | Admitting: Pharmacist

## 2024-06-21 NOTE — Telephone Encounter (Signed)
 Patient called Coumadin  clinic for update.Tammy Robbins Has been started on Methenamine for recurrent UTI. Checked, no interactions with warfarin

## 2024-07-01 ENCOUNTER — Ambulatory Visit

## 2024-07-01 ENCOUNTER — Ambulatory Visit
Admission: RE | Admit: 2024-07-01 | Discharge: 2024-07-01 | Disposition: A | Discharge status: Home / Self Care | Source: Ambulatory Visit | Attending: Student in an Organized Health Care Education/Training Program | Admitting: Student in an Organized Health Care Education/Training Program

## 2024-07-01 DIAGNOSIS — Z5181 Encounter for therapeutic drug level monitoring: Secondary | ICD-10-CM | POA: Diagnosis present

## 2024-07-01 DIAGNOSIS — R0602 Shortness of breath: Secondary | ICD-10-CM | POA: Diagnosis present

## 2024-07-01 LAB — ECHOCARDIOGRAM LIMITED
P 1/2 time: 385 ms
S' Lateral: 3.5 cm

## 2024-07-01 LAB — POCT INR: INR: 2.1 (ref 2.0–3.0)

## 2024-07-01 NOTE — Patient Instructions (Signed)
 continue taking warfarin 1/2 tablet daily except for 1 tablet on Tuesday and Saturday.  Stay consistent with greens each week (1 per week).  Recheck INR in 4 weeks. Call if started on new meds or antibiotics #[phone].

## 2024-07-01 NOTE — Progress Notes (Signed)
 INR 2.1 Please see anticoagulation encounter  continue taking warfarin 1/2 tablet daily except for 1 tablet on Tuesday and Saturday.  Stay consistent with greens each week (1 per week).  Recheck INR in 4 weeks. Call if started on new meds or antibiotics #[phone].

## 2024-07-06 ENCOUNTER — Encounter: Payer: Self-pay | Admitting: Cardiology

## 2024-07-06 ENCOUNTER — Ambulatory Visit: Attending: Cardiology | Admitting: Cardiology

## 2024-07-06 VITALS — BP 128/78 | HR 80 | Ht 64.0 in | Wt 253.6 lb

## 2024-07-06 DIAGNOSIS — I4821 Permanent atrial fibrillation: Secondary | ICD-10-CM

## 2024-07-06 DIAGNOSIS — G4733 Obstructive sleep apnea (adult) (pediatric): Secondary | ICD-10-CM

## 2024-07-06 DIAGNOSIS — R0602 Shortness of breath: Secondary | ICD-10-CM

## 2024-07-06 DIAGNOSIS — Z7901 Long term (current) use of anticoagulants: Secondary | ICD-10-CM

## 2024-07-06 DIAGNOSIS — I10 Essential (primary) hypertension: Secondary | ICD-10-CM | POA: Diagnosis not present

## 2024-07-06 DIAGNOSIS — E782 Mixed hyperlipidemia: Secondary | ICD-10-CM

## 2024-07-06 MED ORDER — NITROGLYCERIN 0.4 MG SL SUBL: 0.4000 mg | SUBLINGUAL_TABLET | SUBLINGUAL | 6 refills | Status: AC | PRN

## 2024-07-06 NOTE — Progress Notes (Signed)
 Cardiology Office Note:  .   Date:  07/06/2024  ID:  Tammy Robbins, DOB 08/30/1945, MRN [id number] PCP:  Henry Ingle, MD  Former Cardiology Providers: DR. ALEENE PASSE [facility] Providers Cardiologist:  Madonna Large, DO Sleep Medicine:  Wilbert Bihari, MD , [facility] (established care 03/25/24) Electrophysiologist:  None  Click to update primary MD,subspecialty MD or APP then REFRESH:1}    Chief Complaint  Patient presents with   Follow-up    Reevaluation of shortness of breath, and reviewe test results    History of Present Illness: .   Tammy Robbins is a 78 y.o. Caucasian female whose past medical history and cardiovascular risk factors includes: Hypertension, hyperlipidemia, atrial fibrillation, hypothyroidism, aortic stenosis, OSA in adult.  Formally under the care of Dr. Passe who last saw Tammy Robbins back in 03/25/2023.  Patient is being followed by the practice given her history of atrial fibrillation.  Atrial fibrillation and anticoagulation - Diagnosed in 2011 - Generally asymptomatic with no noticeable symptoms related to atrial fibrillation - On Coumadin  for anticoagulation to prevent stroke - No history of bleeding complications while on anticoagulation - No prior procedures or medications for rhythm conversion  Patient had an echocardiogram in August 2025 which noted preserved LVEF, elevated left atrial pressures, and RVSP 60 mmHg.  At the last office visit patient endorsed having shortness of breath predominantly with effort related activities such as going up a flight of stairs.  She has been attributing the symptoms to her underlying obesity and deconditioning.  She was also having lower extremity swelling for which she was wearing compression stockings.  She denies orthopnea or PND.  Shared decision was to uptitrate her antihypertensive medications and diuretics to further facilitate diuresis and to repeat a limited echocardiogram in 3 months to reevaluate  RVSP. If despite facilitating diuresis right-sided pressures do not improve we discussed considering right heart catheterization to evaluate for pulmonary arterial hypertension. Patient and daughter are agreeable with the plan of care.   Patient presents today for follow-up.  Since last office visit patient denies any anginal chest pain. Shortness of breath has improved by 80 to 85%. Patient is able to walk longer distances and take fewer breaks. Denies orthopnea, PND. She has lower extremity swelling which is chronic and stable, but overall improving.  Results of the echocardiogram reviewed with her in details and mentioned below for further   Review of Systems: .   Review of Systems  Cardiovascular:  Positive for leg swelling. Negative for chest pain, claudication, irregular heartbeat, near-syncope, orthopnea, palpitations, paroxysmal nocturnal dyspnea and syncope.  Respiratory:  Positive for shortness of breath.   Hematologic/Lymphatic: Negative for bleeding problem.    Studies Reviewed:   Echocardiogram: 04/01/2024 LVEF 65 to 70%, elevated left atrial pressures, RVSP 60 mmHg, left atrial size severely dilated, right atrial size moderately dilated, TR moderate, mild to moderate aortic regurgitation, estimated RAP 8 mmHg.  07/01/2024 Limited study: LVEF 65 to 70%. RV systolic function normal, RVSP 50 mmHg. Biatrial enlargement similar to prior study. Moderate tricuspid regurgitation Moderate aortic regurgitation.  RADIOLOGY: NA  Risk Assessment/Calculations:   Click Here to Calculate/Change CHADS2VASc Score The patient's CHADS2-VASc score is 4, indicating a 4.8% annual risk of stroke.  CHF History: No HTN History: Yes Diabetes History: No Stroke History: No Vascular Disease History: No  Labs:    External Labs: Collected: 11/25/2023. Hemoglobin 11.6. Serum creatinine 0.74. Platelets 240K         Latest Ref Rng &  Units 04/16/2024    8:07 AM 03/30/2021    9:12 AM  03/20/2020    8:22 AM  BMP  Glucose 70 - 99 mg/dL 96  91  896   BUN 8 - 27 mg/dL 21  20  18    Creatinine 0.57 - 1.00 mg/dL 9.13  9.16  9.13   BUN/Creat Ratio 12 - 28 24  24  21    Sodium 134 - 144 mmol/L 141  142  138   Potassium 3.5 - 5.2 mmol/L 4.0  4.0  3.9   Chloride 96 - 106 mmol/L 102  102  101   CO2 20 - 29 mmol/L 23  24  26    Calcium 8.7 - 10.3 mg/dL 9.3  9.1  9.4      Physical Exam:    Today's Vitals   07/06/24 0812  BP: 128/78  Pulse: 80  SpO2: 94%  Weight: 253 lb 9.6 oz (115 kg)  Height: 5' 4 (1.626 m)   Body mass index is 43.53 kg/m. Wt Readings from Last 3 Encounters:  07/06/24 253 lb 9.6 oz (115 kg)  04/07/24 256 lb 3.2 oz (116.2 kg)  03/25/24 257 lb (116.6 kg)    Physical Exam  Constitutional: No distress.  hemodynamically stable  Neck: No JVD present.  Cardiovascular: Normal rate, regular rhythm, S1 normal and S2 normal. Exam reveals no gallop, no S3 and no S4.  Murmur heard. Systolic murmur is present with a grade of 3/6 at the lower left sternal border. Pulmonary/Chest: Effort normal and breath sounds normal. No stridor. She has no wheezes. She has no rales.  Musculoskeletal:        General: Edema (R>L) present.     Cervical back: Neck supple.     Comments: Compression stocking  Skin: Skin is warm.   Impression & Recommendation(s):  Impression:   ICD-10-CM   1. Permanent atrial fibrillation (HCC)  I48.21     2. Long term (current) use of anticoagulants  Z79.01     3. Shortness of breath  R06.02     4. Essential hypertension  I10     5. Mixed hyperlipidemia  E78.2     6. OSA (obstructive sleep apnea)  G47.33         Recommendation(s):  Permanent atrial fibrillation (HCC) Long-term oral anticoagulation Presumed to be permanent, EKGs illustrates atrial fibrillation since 2021 Rate control: Carvedilol  25 mg p.o. twice daily. Rhythm control: N/A. Thromboembolic prophylaxis: Coumadin . Continues to follow-up with the Coumadin   clinic. Patient states that she gets her renal function and blood work checked every 6 months with PCP/rheumatology  Shortness of breath Chronic & improved Multifactorial: Valvular heart disease, atrial fibrillation, on methotrexate, obesity, deconditioning, etc. Initial echocardiogram noted RVSP close to 60 mmHg with no prior studies available for comparison.  We discussed at the last office visit to uptitrate her antihypertensive medications and diuretics and to reevaluate her right sided filling pressures. With the medication changes patient states that her shortness of breath is improved by approximately 80%. She is walking longer distances and taking fewer breaks.  She still ambulates with a cane at baseline to prevent falls. Recent echocardiogram notes improvement in RVSP closer to 50 mmHg. I suspect she has diastolic dysfunction given her clinical trajectory given her comorbidities and underlying A-fib. I recommended right heart catheterization and after discussing the risks, benefits, alternatives patient would like to hold off invasive procedures at this time. We also discussed the role of exercise pulmonary stress test but given  the fact that she ambulates with a cane she would like to hold off. Will continue current medical therapy. Patient recommended to continue to check her weights regularly. She has been on methotrexate for many years, recommended pulmonary evaluation to rule out any concerns for methotrexate toxicity.  She will discuss with rheumatology and PCP for consult.  Essential hypertension Office blood pressures are well-controlled. Medication changes as discussed above  Mixed hyperlipidemia Currently on Crestor 10 mg p.o. nightly. Cardiology following peripherally, managed by primary care provider.  OSA (obstructive sleep apnea) Utilizes CPAP on a regular basis.  Patient requesting a refill on nitroglycerin  tablets, hers are currently 77 years old.  Orders  Placed:  No orders of the defined types were placed in this encounter.    Final Medication List:    Meds ordered this encounter  Medications   nitroGLYCERIN  (NITROSTAT ) 0.4 MG SL tablet    Sig: Place 1 tablet (0.4 mg total) under the tongue every 5 (five) minutes as needed.    Dispense:  25 tablet    Refill:  6    Medications Discontinued During This Encounter  Medication Reason   nitroGLYCERIN  (NITROSTAT ) 0.4 MG SL tablet Reorder      Current Outpatient Medications:    Acetaminophen (TYLENOL ARTHRITIS PAIN PO), Take 2 capsules by mouth every 8 (eight) hours as needed., Disp: , Rfl:    allopurinol (ZYLOPRIM) 300 MG tablet, Take 300 mg by mouth daily., Disp: , Rfl:    carvedilol  (COREG ) 25 MG tablet, Take 1 tablet (25 mg total) by mouth 2 (two) times daily with a meal., Disp: 180 tablet, Rfl: 3   clotrimazole (LOTRIMIN) 1 % cream, Apply topically., Disp: , Rfl:    etanercept (ENBREL SURECLICK) 50 MG/ML injection, Inject 50 mg into the skin once a week., Disp: , Rfl:    fish oil-omega-3 fatty acids 1000 MG capsule, Take by mouth daily., Disp: , Rfl:    folic acid (FOLVITE) 1 MG tablet, Take 1 mg by mouth daily., Disp: , Rfl:    furosemide  (LASIX ) 20 MG tablet, Take 1 tablet (20 mg total) by mouth in the morning., Disp: 90 tablet, Rfl: 3   levothyroxine (SYNTHROID, LEVOTHROID) 75 MCG tablet, Take 75 mcg by mouth daily before breakfast., Disp: , Rfl:    losartan  (COZAAR ) 50 MG tablet, Take 1 tablet (50 mg total) by mouth at bedtime., Disp: 90 tablet, Rfl: 3   meclizine (ANTIVERT) 12.5 MG tablet, Take 12.5 mg by mouth 3 (three) times daily as needed for dizziness., Disp: , Rfl:    methotrexate (RHEUMATREX) 2.5 MG tablet, Take 2.5 mg by mouth once a week. Take 8 pills by mouth weekly., Disp: , Rfl:    Multiple Vitamin (MULTIVITAMIN) tablet, Take 1 tablet by mouth daily., Disp: , Rfl:    omeprazole (PRILOSEC) 20 MG capsule, Take 20 mg by mouth daily., Disp: , Rfl:    potassium chloride   (KLOR-CON ) 10 MEQ tablet, Take 1 tablet (10 mEq total) by mouth 2 (two) times daily., Disp: 180 tablet, Rfl: 3   rosuvastatin (CRESTOR) 10 MG tablet, Take 10 mg by mouth daily., Disp: , Rfl:    traMADol  (ULTRAM ) 50 MG tablet, Take 50 mg by mouth 2 (two) times daily., Disp: , Rfl:    warfarin (COUMADIN ) 5 MG tablet, TAKE 1 TABLET BY MOUTH DAILY. EXCEPT 1/2 TABLET ON MONDAY, WEDNESDAY, FRIDAY OR AS DIRECTED BY COUMADIN  CLINIC, Disp: 90 tablet, Rfl: 0   nitroGLYCERIN  (NITROSTAT ) 0.4 MG SL tablet, Place 1 tablet (  0.4 mg total) under the tongue every 5 (five) minutes as needed., Disp: 25 tablet, Rfl: 6  Consent:   NA  Disposition:   6 month follow up.   Her questions and concerns were addressed to her satisfaction. She voices understanding of the recommendations provided during this encounter.    Signed, Madonna Michele HAS, [facility]  A Division of [facility] [address], [place], [place] [address]  07/06/2024 8:50 AM

## 2024-07-06 NOTE — Patient Instructions (Signed)
 Medication Instructions:  Your physician recommends that you continue on your current medications as directed. Please refer to the Current Medication list given to you today.  *If you need a refill on your cardiac medications before your next appointment, please call your pharmacy*  Lab Work: None ordered If you have labs (blood work) drawn today and your tests are completely normal, you will receive your results only by: MyChart Message (if you have MyChart) OR A paper copy in the mail If you have any lab test that is abnormal or we need to change your treatment, we will call you to review the results.  Testing/Procedures: None Ordered  Follow-Up: At [facility], you and your health needs are our priority.  As part of our continuing mission to provide you with exceptional heart care, our providers are all part of one team.  This team includes your primary Cardiologist (physician) and Advanced Practice Providers or APPs (Physician Assistants and Nurse Practitioners) who all work together to provide you with the care you need, when you need it.  Your next appointment:   6 month(s)  Provider:   Madonna Large, DO    We recommend signing up for the patient portal called MyChart.  Sign up information is provided on this After Visit Summary.  MyChart is used to connect with patients for Virtual Visits (Telemedicine).  Patients are able to view lab/test results, encounter notes, upcoming appointments, etc.  Non-urgent messages can be sent to your provider as well.   To learn more about what you can do with MyChart, go to [email or web address].   Other Instructions Speak with your PCP to schedule a Pulmonary Consult for Shortness of Breath.

## 2024-08-03 ENCOUNTER — Ambulatory Visit: Attending: Cardiology

## 2024-08-03 DIAGNOSIS — I4891 Unspecified atrial fibrillation: Secondary | ICD-10-CM | POA: Diagnosis not present

## 2024-08-03 DIAGNOSIS — Z5181 Encounter for therapeutic drug level monitoring: Secondary | ICD-10-CM | POA: Diagnosis not present

## 2024-08-03 LAB — POCT INR: INR: 1.7 — AB (ref 2.0–3.0)

## 2024-08-03 NOTE — Progress Notes (Signed)
 Description   INR 1.7, Take 1.5 tablets today, then start taking Warfarin 1/2 tablet daily except for 1 tablet on Tuesdays, Thursdays and Saturdays.  Stay consistent with greens each week (1 per week).  Recheck INR in 3 weeks. Call if started on new meds or antibiotics #[phone].

## 2024-08-03 NOTE — Patient Instructions (Signed)
 Description   INR 1.7, Take 1.5 tablets today, then start taking Warfarin 1/2 tablet daily except for 1 tablet on Tuesdays, Thursdays and Saturdays.  Stay consistent with greens each week (1 per week).  Recheck INR in 3 weeks. Call if started on new meds or antibiotics #[phone].

## 2024-08-24 ENCOUNTER — Ambulatory Visit: Admitting: *Deleted

## 2024-08-24 DIAGNOSIS — I4891 Unspecified atrial fibrillation: Secondary | ICD-10-CM

## 2024-08-24 DIAGNOSIS — Z5181 Encounter for therapeutic drug level monitoring: Secondary | ICD-10-CM | POA: Diagnosis not present

## 2024-08-24 LAB — POCT INR: INR: 1.8 — AB (ref 2.0–3.0)

## 2024-08-24 NOTE — Patient Instructions (Signed)
 Description   INR 1.8; Take 1.5 tablets today, then start taking Warfarin 1 tablet daily except for 1/2 tablet on Mondays, Wednesdays, and Fridays.  Stay consistent with greens each week (1 per week).  Recheck INR in 3 weeks. Call if started on new meds or antibiotics #[phone].

## 2024-08-24 NOTE — Progress Notes (Signed)
 INR goal 2-3  Description   INR 1.8; Take 1.5 tablets today, then start taking Warfarin 1 tablet daily except for 1/2 tablet on Mondays, Wednesdays, and Fridays.  Stay consistent with greens each week (1 per week).  Recheck INR in 3 weeks. Call if started on new meds or antibiotics #[phone].

## 2024-08-25 ENCOUNTER — Encounter: Payer: Self-pay | Admitting: Cardiology

## 2024-09-13 ENCOUNTER — Ambulatory Visit: Admitting: Pulmonary Disease

## 2024-09-14 ENCOUNTER — Ambulatory Visit

## 2024-09-29 ENCOUNTER — Ambulatory Visit: Admitting: Pulmonary Disease

## 2024-09-30 ENCOUNTER — Ambulatory Visit

## 2024-11-29 ENCOUNTER — Ambulatory Visit: Admitting: Cardiology
